# Patient Record
Sex: Female | Born: 1955 | Race: White | Smoking: Never smoker
Health system: Southern US, Community
[De-identification: ages and names within clinical notes are randomized; demographics above are authoritative.]

## PROBLEM LIST (undated history)

## (undated) ENCOUNTER — Emergency Department (HOSPITAL_COMMUNITY): Discharge status: Absent without leave | Payer: Commercial Managed Care - HMO

## (undated) DIAGNOSIS — F32A Depression, unspecified: Secondary | ICD-10-CM

## (undated) DIAGNOSIS — E785 Hyperlipidemia, unspecified: Secondary | ICD-10-CM

## (undated) DIAGNOSIS — F419 Anxiety disorder, unspecified: Secondary | ICD-10-CM

## (undated) DIAGNOSIS — E119 Type 2 diabetes mellitus without complications: Secondary | ICD-10-CM

## (undated) DIAGNOSIS — R519 Headache, unspecified: Secondary | ICD-10-CM

## (undated) DIAGNOSIS — G2581 Restless legs syndrome: Secondary | ICD-10-CM

## (undated) DIAGNOSIS — F329 Major depressive disorder, single episode, unspecified: Secondary | ICD-10-CM

## (undated) DIAGNOSIS — N189 Chronic kidney disease, unspecified: Secondary | ICD-10-CM

## (undated) DIAGNOSIS — D649 Anemia, unspecified: Secondary | ICD-10-CM

## (undated) DIAGNOSIS — J189 Pneumonia, unspecified organism: Secondary | ICD-10-CM

## (undated) DIAGNOSIS — C439 Malignant melanoma of skin, unspecified: Secondary | ICD-10-CM

## (undated) DIAGNOSIS — G4700 Insomnia, unspecified: Secondary | ICD-10-CM

## (undated) DIAGNOSIS — I10 Essential (primary) hypertension: Secondary | ICD-10-CM

## (undated) HISTORY — DX: Essential (primary) hypertension: I10

## (undated) HISTORY — DX: Depression, unspecified: F32.A

## (undated) HISTORY — DX: Type 2 diabetes mellitus without complications: E11.9

## (undated) HISTORY — DX: Insomnia, unspecified: G47.00

## (undated) HISTORY — PX: ABDOMINAL HYSTERECTOMY: SHX81

## (undated) HISTORY — DX: Restless legs syndrome: G25.81

## (undated) HISTORY — DX: Hyperlipidemia, unspecified: E78.5

## (undated) HISTORY — PX: UPPER GI ENDOSCOPY: SHX6162

---
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
## 1983-07-22 HISTORY — PX: MELANOMA EXCISION: SHX5266

## 2015-03-06 DIAGNOSIS — E119 Type 2 diabetes mellitus without complications: Secondary | ICD-10-CM | POA: Insufficient documentation

## 2015-03-06 DIAGNOSIS — E785 Hyperlipidemia, unspecified: Secondary | ICD-10-CM | POA: Insufficient documentation

## 2015-03-06 DIAGNOSIS — I10 Essential (primary) hypertension: Secondary | ICD-10-CM | POA: Insufficient documentation

## 2016-01-08 DIAGNOSIS — F419 Anxiety disorder, unspecified: Secondary | ICD-10-CM | POA: Insufficient documentation

## 2016-01-08 DIAGNOSIS — N1832 Chronic kidney disease, stage 3b: Secondary | ICD-10-CM | POA: Insufficient documentation

## 2016-01-08 DIAGNOSIS — N183 Chronic kidney disease, stage 3 unspecified: Secondary | ICD-10-CM | POA: Insufficient documentation

## 2018-09-29 ENCOUNTER — Ambulatory Visit: Payer: Self-pay | Attending: Nurse Practitioner | Admitting: Licensed Clinical Social Worker

## 2018-09-29 ENCOUNTER — Encounter: Payer: Self-pay | Admitting: Nurse Practitioner

## 2018-09-29 ENCOUNTER — Ambulatory Visit: Payer: Self-pay | Attending: Nurse Practitioner | Admitting: Nurse Practitioner

## 2018-09-29 ENCOUNTER — Other Ambulatory Visit: Payer: Self-pay

## 2018-09-29 VITALS — BP 139/83 | HR 82 | Temp 98.2°F | Ht 62.0 in | Wt 263.8 lb

## 2018-09-29 DIAGNOSIS — Z594 Lack of adequate food and safe drinking water: Secondary | ICD-10-CM

## 2018-09-29 DIAGNOSIS — G894 Chronic pain syndrome: Secondary | ICD-10-CM

## 2018-09-29 DIAGNOSIS — F332 Major depressive disorder, recurrent severe without psychotic features: Secondary | ICD-10-CM

## 2018-09-29 DIAGNOSIS — M1A09X0 Idiopathic chronic gout, multiple sites, without tophus (tophi): Secondary | ICD-10-CM

## 2018-09-29 DIAGNOSIS — N183 Chronic kidney disease, stage 3 unspecified: Secondary | ICD-10-CM

## 2018-09-29 DIAGNOSIS — Z5941 Food insecurity: Secondary | ICD-10-CM

## 2018-09-29 DIAGNOSIS — Z1211 Encounter for screening for malignant neoplasm of colon: Secondary | ICD-10-CM

## 2018-09-29 DIAGNOSIS — I10 Essential (primary) hypertension: Secondary | ICD-10-CM

## 2018-09-29 DIAGNOSIS — E1169 Type 2 diabetes mellitus with other specified complication: Secondary | ICD-10-CM

## 2018-09-29 LAB — POCT GLYCOSYLATED HEMOGLOBIN (HGB A1C): Hemoglobin A1C: 6.2 % — AB (ref 4.0–5.6)

## 2018-09-29 LAB — GLUCOSE, POCT (MANUAL RESULT ENTRY): POC GLUCOSE: 136 mg/dL — AB (ref 70–99)

## 2018-09-29 MED ORDER — FUROSEMIDE 40 MG PO TABS: 40.0000 mg | ORAL_TABLET | Freq: Every day | ORAL | 0 refills | Status: DC

## 2018-09-29 MED ORDER — ALLOPURINOL 100 MG PO TABS: 100.0000 mg | ORAL_TABLET | Freq: Every day | ORAL | 1 refills | Status: DC

## 2018-09-29 MED ORDER — LISINOPRIL 10 MG PO TABS: 10.0000 mg | ORAL_TABLET | Freq: Every day | ORAL | 2 refills | Status: DC

## 2018-09-29 MED ORDER — FUROSEMIDE 20 MG PO TABS: 20.0000 mg | ORAL_TABLET | Freq: Every day | ORAL | 0 refills | Status: DC

## 2018-09-29 MED ORDER — LOVASTATIN 40 MG PO TABS: 40.0000 mg | ORAL_TABLET | Freq: Every day | ORAL | 2 refills | Status: DC

## 2018-09-29 MED ORDER — GLIPIZIDE ER 10 MG PO TB24: 10.0000 mg | ORAL_TABLET | Freq: Every day | ORAL | 2 refills | Status: DC

## 2018-09-29 MED ORDER — ESCITALOPRAM OXALATE 20 MG PO TABS: 20.0000 mg | ORAL_TABLET | Freq: Every day | ORAL | 1 refills | Status: DC

## 2018-09-29 MED ORDER — PIOGLITAZONE HCL 30 MG PO TABS: 30.0000 mg | ORAL_TABLET | Freq: Every day | ORAL | 2 refills | Status: DC

## 2018-09-29 MED ORDER — DULOXETINE HCL 30 MG PO CPEP: 30.0000 mg | ORAL_CAPSULE | Freq: Two times a day (BID) | ORAL | 3 refills | Status: DC

## 2018-09-29 MED FILL — glipiZIDE XL 10 MG TB24: 10 | 30 days supply | Qty: 30 | Fill #0

## 2018-09-29 MED FILL — ESCITALOPRAM 20 MG TABLET: 20 | 30 days supply | Qty: 30 | Fill #0

## 2018-09-29 MED FILL — DULoxetine HCL 30 MG CPEP: 30 | 30 days supply | Qty: 60 | Fill #0

## 2018-09-29 MED FILL — PIOGLITAZONE HCL 30 MG TAB: 30 | 30 days supply | Qty: 30 | Fill #0

## 2018-09-29 MED FILL — ALLOPURINOL 100 MG TABLET: 100 | 30 days supply | Qty: 30 | Fill #0

## 2018-09-29 MED FILL — LOVASTATIN 40 MG TABS: 40 | 30 days supply | Qty: 30 | Fill #0

## 2018-09-29 MED FILL — FUROSEMIDE 20 MG TABLET: 20 | 30 days supply | Qty: 30 | Fill #0

## 2018-09-29 MED FILL — LISINOPRIL 10 MG TABS: 10 | 30 days supply | Qty: 30 | Fill #0

## 2018-09-29 NOTE — Progress Notes (Signed)
Assessment & Plan:  Kristy Flores was seen today for new patient (initial visit).  Diagnoses and all orders for this visit:  Type 2 diabetes mellitus with other specified complication, unspecified whether long term insulin use (HCC) -     Glucose (CBG) -     HgB A1c -     glipiZIDE (GLUCOTROL XL) 10 MG 24 hr tablet; Take 1 tablet (10 mg total) by mouth daily with breakfast. -     pioglitazone (ACTOS) 30 MG tablet; Take 1 tablet (30 mg total) by mouth daily. -     CBC -     CMP14+EGFR -     Lipid panel -     lovastatin (MEVACOR) 40 MG tablet; Take 1 tablet (40 mg total) by mouth at bedtime. -     TSH Continue blood sugar control as discussed in office today, low carbohydrate diet, and regular physical exercise as tolerated, 150 minutes per week (30 min each day, 5 days per week, or 50 min 3 days per week). Keep blood sugar logs with fasting goal of 90-130 mg/dl, post prandial (after you eat) less than 180.  For Hypoglycemia: BS <60 and Hyperglycemia BS >400; contact the clinic ASAP. Annual eye exams and foot exams are recommended.   Essential hypertension -     lisinopril (PRINIVIL,ZESTRIL) 10 MG tablet; Take 1 tablet (10 mg total) by mouth daily. Continue all antihypertensives as prescribed.  Remember to bring in your blood pressure log with you for your follow up appointment.  DASH/Mediterranean Diets are healthier choices for HTN.    Chronic pain syndrome -     DULoxetine (CYMBALTA) 30 MG capsule; Take 1 capsule (30 mg total) by mouth 2 (two) times daily for 30 days.   Chronic gout of multiple sites, unspecified cause -     allopurinol (ZYLOPRIM) 100 MG tablet; Take 1 tablet (100 mg total) by mouth daily for 30 days. -     Uric Acid  Colon cancer screening -     Fecal occult blood, imunochemical  Moderately severe recurrent major depression (HCC) -     escitalopram (LEXAPRO) 20 MG tablet; Take 1 tablet (20 mg total) by mouth daily for 30 days.  CKD (chronic kidney disease)  stage 3, GFR 30-59 ml/min (HCC) -     furosemide (LASIX) 20 MG tablet; Take 1 tablet (20 mg total) by mouth daily. Labs pending     Patient has been counseled on age-appropriate routine health concerns for screening and prevention. These are reviewed and up-to-date. Referrals have been placed accordingly. Immunizations are up-to-date or declined.    Subjective:   Chief Complaint  Patient presents with  . New Patient (Initial Visit)    Pt. is here as a new patient. Pt. stated her bone hurts.    HPI Kristy Flores 63 y.o. female presents to office today to establish care. She has a history of HTN, DM type 2, RLS, CPS, insomnia and CKD stage 3. Labs pending today.    DM TYPE 2 Chronic. Highest A1c that she can recall is  8.0. Currently well controlled at 6.2.. Medications taking include: Glipizide 70m daily and actos 30 mg daily. She does not monitor her blood glucose levels. Denis any hypo or hyperglycemic symptoms. Overdue for eye exam.  Lab Results  Component Value Date   HGBA1C 6.2 (A) 09/29/2018   Chronic Pain She was taking xanax 3 times a day and she was started on duloxetine instead 100 mg BID. Pain is generalized  and involves multiple joints.  She has tried gabapentin before but states it "made me swell up"    Essential Hypertension Chronic and well controlled taking lisinopril 32m daily. Denies chest pain, shortness of breath, palpitations, lightheadedness, dizziness, headaches or BLE edema. Discussed exercise and weight loss goals today. BMI 48 BP Readings from Last 3 Encounters:  09/29/18 139/83   Anxiety and Depression She has never taken an SSRI. Only xanax in the past. PHq9 screening is high as well as GAD score. She is willing to try lexapro today. Denies any thoughts of self harm. Will not continue xanax. She has not taken this medication since last year.   Depression screen PHQ 2/9 09/29/2018  Decreased Interest 0  Down, Depressed, Hopeless 2  PHQ - 2 Score 2   Altered sleeping 3  Tired, decreased energy 3  Change in appetite 3  Feeling bad or failure about yourself  3  Trouble concentrating 2  Moving slowly or fidgety/restless 0  Suicidal thoughts 0  PHQ-9 Score 16   GAD 7 : Generalized Anxiety Score 09/29/2018  Nervous, Anxious, on Edge 3  Control/stop worrying 3  Worry too much - different things 3  Trouble relaxing 3  Restless 3  Easily annoyed or irritable 3  Afraid - awful might happen 3  Total GAD 7 Score 21   Chronic GOUT In the past her gout symptoms have been well controlled with allopurinol 1066m. States she has taken as much as 200 mg however this was decreased to 100 mg by her previous PCP. She can not recall the reason.    Review of Systems  Constitutional: Negative for fever, malaise/fatigue and weight loss.  HENT: Negative.  Negative for nosebleeds.   Eyes: Negative.  Negative for blurred vision, double vision and photophobia.  Respiratory: Negative.  Negative for cough and shortness of breath.   Cardiovascular: Negative.  Negative for chest pain, palpitations and leg swelling.  Gastrointestinal: Negative.  Negative for heartburn, nausea and vomiting.  Musculoskeletal: Positive for joint pain. Negative for myalgias.       SEE HPI  Neurological: Negative.  Negative for dizziness, focal weakness, seizures and headaches.  Psychiatric/Behavioral: Positive for depression. Negative for suicidal ideas. The patient is nervous/anxious and has insomnia.     Past Medical History:  Diagnosis Date  . [place]  . Hyperlipidemia   . Hypertension   . Insomnia   . Restless leg syndrome     Past Surgical History:  Procedure Laterality Date  . ABDOMINAL HYSTERECTOMY    . MELANOMA EXCISION  1985    Family History  Problem Relation Age of Onset  . Diabetes Mother   . Cancer Father     Social History Reviewed with no changes to be made today.   Outpatient Medications Prior to Visit   Medication Sig Dispense Refill  . allopurinol (ZYLOPRIM) 100 MG tablet Take 100 mg by mouth daily.    . DULOXETINE HCL PO Take 30 mg by mouth 2 (two) times daily.     . furosemide (LASIX) 40 MG tablet Take 40 mg by mouth daily. 1/2 tablet    . glipiZIDE (GLUCOTROL XL) 10 MG 24 hr tablet Take 10 mg by mouth daily with breakfast.    . lovastatin (MEVACOR) 40 MG tablet Take 40 mg by mouth at bedtime.    . pioglitazone (ACTOS) 30 MG tablet Take 30 mg by mouth daily.    . Marland KitchenLPRAZolam (XANAX) 0.5 MG tablet Take  0.5 mg by mouth at bedtime as needed for anxiety.    Marland Kitchen lisinopril (PRINIVIL,ZESTRIL) 10 MG tablet Take 10 mg by mouth daily.     No facility-administered medications prior to visit.     Allergies  Allergen Reactions  . Codeine     Vomit  . Gabapentin     AKI  . Penicillins     Broke out, stomach upset.   . Victoza [Liraglutide]     creatinine       Objective:    BP 139/83 (BP Location: Left Arm, Patient Position: Sitting, Cuff Size: Large)   Pulse 82   Temp 98.2 F (36.8 C) (Oral)   Ht _0  (1.575 m)   Wt 263 lb 12.8 oz (119.7 kg)   SpO2 96%   BMI 48.25 kg/m  Wt Readings from Last 3 Encounters:  09/29/18 263 lb 12.8 oz (119.7 kg)    Physical Exam Vitals signs and nursing note reviewed.  Constitutional:      Appearance: She is well-developed.  HENT:     Head: Normocephalic and atraumatic.  Neck:     Musculoskeletal: Normal range of motion.  Cardiovascular:     Rate and Rhythm: Normal rate and regular rhythm.     Heart sounds: Normal heart sounds. No murmur. No friction rub. No gallop.   Pulmonary:     Effort: Pulmonary effort is normal. No tachypnea or respiratory distress.     Breath sounds: Normal breath sounds. No decreased breath sounds, wheezing, rhonchi or rales.  Chest:     Chest wall: No tenderness.  Abdominal:     General: Bowel sounds are normal.     Palpations: Abdomen is soft.  Musculoskeletal: Normal range of motion.  Skin:    General: Skin  is warm and dry.  Neurological:     Mental Status: She is alert and oriented to person, place, and time.     Coordination: Coordination normal.  Psychiatric:        Behavior: Behavior normal. Behavior is cooperative.        Thought Content: Thought content normal.        Judgment: Judgment normal.          Patient has been counseled extensively about nutrition and exercise as well as the importance of adherence with medications and regular follow-up. The patient was given clear instructions to go to ER or return to medical center if symptoms don't improve, worsen or new problems develop. The patient verbalized understanding.   Follow-up: Return in about 2 months (around 11/29/2018) for Physical ONLY no labs.   Gildardo Pounds, FNP-BC [facility] and [place], [place]   09/29/2018, 9:33 PM

## 2018-09-30 ENCOUNTER — Other Ambulatory Visit: Payer: Self-pay | Admitting: Nurse Practitioner

## 2018-09-30 DIAGNOSIS — M1A09X0 Idiopathic chronic gout, multiple sites, without tophus (tophi): Secondary | ICD-10-CM

## 2018-09-30 LAB — CMP14+EGFR
ALT: 15 IU/L (ref 0–32)
AST: 15 IU/L (ref 0–40)
Albumin/Globulin Ratio: 1.4 (ref 1.2–2.2)
Albumin: 4.2 g/dL (ref 3.8–4.8)
Alkaline Phosphatase: 85 IU/L (ref 39–117)
BILIRUBIN TOTAL: 0.2 mg/dL (ref 0.0–1.2)
BUN/Creatinine Ratio: 30 — ABNORMAL HIGH (ref 12–28)
BUN: 45 mg/dL — AB (ref 8–27)
CHLORIDE: 103 mmol/L (ref 96–106)
CO2: 22 mmol/L (ref 20–29)
Calcium: 9.7 mg/dL (ref 8.7–10.3)
Creatinine, Ser: 1.5 mg/dL — ABNORMAL HIGH (ref 0.57–1.00)
GFR calc Af Amer: 43 mL/min/{1.73_m2} — ABNORMAL LOW (ref >59)
GFR calc non Af Amer: 37 mL/min/{1.73_m2} — ABNORMAL LOW (ref >59)
GLUCOSE: 120 mg/dL — AB (ref 65–99)
Globulin, Total: 2.9 g/dL (ref 1.5–4.5)
Potassium: 4.2 mmol/L (ref 3.5–5.2)
Sodium: 143 mmol/L (ref 134–144)
Total Protein: 7.1 g/dL (ref 6.0–8.5)

## 2018-09-30 LAB — LIPID PANEL
Chol/HDL Ratio: 4.8 ratio — ABNORMAL HIGH (ref 0.0–4.4)
Cholesterol, Total: 216 mg/dL — ABNORMAL HIGH (ref 100–199)
HDL: 45 mg/dL (ref >39)
LDL Calculated: 130 mg/dL — ABNORMAL HIGH (ref 0–99)
Triglycerides: 206 mg/dL — ABNORMAL HIGH (ref 0–149)
VLDL Cholesterol Cal: 41 mg/dL — ABNORMAL HIGH (ref 5–40)

## 2018-09-30 LAB — CBC
Hematocrit: 37.7 % (ref 34.0–46.6)
Hemoglobin: 12.2 g/dL (ref 11.1–15.9)
MCH: 29.8 pg (ref 26.6–33.0)
MCHC: 32.4 g/dL (ref 31.5–35.7)
MCV: 92 fL (ref 79–97)
Platelets: 253 10*3/uL (ref 150–450)
RBC: 4.1 x10E6/uL (ref 3.77–5.28)
RDW: 13.6 % (ref 11.7–15.4)
WBC: 7.4 10*3/uL (ref 3.4–10.8)

## 2018-09-30 LAB — URIC ACID: Uric Acid: 7.4 mg/dL — ABNORMAL HIGH (ref 2.5–7.1)

## 2018-09-30 LAB — TSH: TSH: 2.45 u[IU]/mL (ref 0.450–4.500)

## 2018-09-30 MED ORDER — ALLOPURINOL 100 MG PO TABS: 200.0000 mg | ORAL_TABLET | Freq: Every day | ORAL | 2 refills | Status: DC

## 2018-10-01 ENCOUNTER — Telehealth: Payer: Self-pay

## 2018-10-01 NOTE — Telephone Encounter (Signed)
-----   Message from Gildardo Pounds, NP sent at 09/30/2018 11:10 PM EDT ----- Labs do not show anemia. Creatinine level is elevated. Liver function is normal. Cholesterol levels are elevated as well. INSTRUCTIONS: Work on a low fat, heart healthy diet and participate in regular aerobic exercise program by working out at least 150 minutes per week; 5 days a week-30 minutes per day. Avoid red meat, fried foods. junk foods, sodas, sugary drinks, unhealthy snacking, alcohol and smoking.  Drink at least 48oz of water per day and monitor your carbohydrate intake daily. Thyroid level is normal. Uric acid is high. Will increase allopurinol to 200mg . Please take 2 tablets daily. When you run out the new prescription will be sent to the pharmacy. Please make an appointment to recheck your uric acid levels in 6weeks.

## 2018-10-01 NOTE — Telephone Encounter (Signed)
CMA spoke to patient to inform on results.  Pt. Is aware of new medication changes.  Pt. Verified DOB.  Pt. Scheduled a lab appt to recheck Uric Acid Level on 11/12/2018.

## 2018-10-01 NOTE — BH Specialist Note (Signed)
LCSWA introduced self and explained role at [facility]. Pt went over SDOH screener which indicated food insecurity. Supportive resources were provided.

## 2018-10-04 ENCOUNTER — Other Ambulatory Visit: Payer: Self-pay | Admitting: Nurse Practitioner

## 2018-10-04 DIAGNOSIS — E790 Hyperuricemia without signs of inflammatory arthritis and tophaceous disease: Secondary | ICD-10-CM

## 2018-10-12 ENCOUNTER — Telehealth (HOSPITAL_COMMUNITY): Payer: Self-pay

## 2018-10-12 NOTE — Telephone Encounter (Signed)
Left message with patient letting her know we received a referral from her primary care provider to get her scheduled for a mammo. Left name and number for her to call back.

## 2018-10-28 MED FILL — LISINOPRIL 10 MG TABS: 10 | 30 days supply | Qty: 30 | Fill #1

## 2018-10-28 MED FILL — FUROSEMIDE 20 MG TABS: 20 | 30 days supply | Qty: 30 | Fill #1

## 2018-10-28 MED FILL — PIOGLITAZONE HCL 30 MG TAB: 30 | 30 days supply | Qty: 30 | Fill #1

## 2018-10-28 MED FILL — glipiZIDE XL 10 MG TB24: 10 | 30 days supply | Qty: 30 | Fill #1

## 2018-10-28 MED FILL — ALLOPURINOL 100 MG TABLET: 100 | 30 days supply | Qty: 30 | Fill #1

## 2018-10-28 MED FILL — ESCITALOPRAM 20 MG TABLET: 20 | 30 days supply | Qty: 30 | Fill #1

## 2018-10-28 MED FILL — DULoxetine HCL 30 MG CPEP: 30 | 30 days supply | Qty: 60 | Fill #1

## 2018-10-28 MED FILL — LOVASTATIN 40 MG TABS: 40 | 30 days supply | Qty: 30 | Fill #1

## 2018-11-09 ENCOUNTER — Telehealth: Payer: Self-pay | Admitting: Licensed Clinical Social Worker

## 2018-11-09 NOTE — Telephone Encounter (Signed)
Call placed to patient to inform of food delivery services starting Wednesday, November 10, 2018. LCSWA requested patient return phone call at earliest convenience.

## 2018-11-12 ENCOUNTER — Ambulatory Visit: Payer: Self-pay | Attending: Family Medicine

## 2018-11-12 ENCOUNTER — Other Ambulatory Visit: Payer: Self-pay

## 2018-11-12 DIAGNOSIS — E790 Hyperuricemia without signs of inflammatory arthritis and tophaceous disease: Secondary | ICD-10-CM

## 2018-11-13 LAB — URIC ACID: Uric Acid: 4.3 mg/dL (ref 2.5–7.1)

## 2018-11-16 ENCOUNTER — Telehealth: Payer: Self-pay

## 2018-11-16 NOTE — Telephone Encounter (Signed)
-----   Message from Gildardo Pounds, NP sent at 11/14/2018  1:18 PM EDT ----- Uric acid is normal. Continue allopurinol 200 mg daily as prescribed.

## 2018-11-16 NOTE — Telephone Encounter (Signed)
CMA attempt to reach patient to inform on results.   CMA left a VM for patient.

## 2018-12-01 ENCOUNTER — Other Ambulatory Visit: Payer: Self-pay | Admitting: Nurse Practitioner

## 2018-12-01 DIAGNOSIS — F332 Major depressive disorder, recurrent severe without psychotic features: Secondary | ICD-10-CM

## 2018-12-01 MED FILL — LOVASTATIN 40 MG TABS: 40 | 30 days supply | Qty: 30 | Fill #2

## 2018-12-01 MED FILL — FUROSEMIDE 20 MG TABS: 20 | 30 days supply | Qty: 30 | Fill #2

## 2018-12-01 MED FILL — LISINOPRIL 10 MG TABS: 10 | 30 days supply | Qty: 30 | Fill #2

## 2018-12-01 MED FILL — ESCITALOPRAM 20 MG TABLET: 20 | 30 days supply | Qty: 30 | Fill #0

## 2018-12-01 MED FILL — PIOGLITAZONE HCL 30 MG TAB: 30 | 30 days supply | Qty: 30 | Fill #2

## 2018-12-01 MED FILL — glipiZIDE XL 10 MG TB24: 10 | 30 days supply | Qty: 30 | Fill #2

## 2018-12-01 MED FILL — ALLOPURINOL 100 MG TABLET: 100 | 30 days supply | Qty: 60 | Fill #0

## 2018-12-01 MED FILL — DULoxetine HCL 30 MG CPEP: 30 | 30 days supply | Qty: 60 | Fill #2

## 2018-12-02 ENCOUNTER — Telehealth (HOSPITAL_COMMUNITY): Payer: Self-pay

## 2018-12-02 NOTE — Telephone Encounter (Signed)
Left message with patient letting her know that we received a referral from her primary care provider to get her scheduled with our [facility] program. Left name and number for her to call back.

## 2018-12-03 ENCOUNTER — Ambulatory Visit: Payer: Self-pay | Admitting: Nurse Practitioner

## 2018-12-31 ENCOUNTER — Other Ambulatory Visit: Payer: Self-pay | Admitting: Nurse Practitioner

## 2018-12-31 DIAGNOSIS — N183 Chronic kidney disease, stage 3 unspecified: Secondary | ICD-10-CM

## 2018-12-31 MED FILL — LOVASTATIN 40 MG TABS: 40 | 30 days supply | Qty: 30 | Fill #3

## 2018-12-31 MED FILL — ALLOPURINOL 100 MG TABLET: 100 | 15 days supply | Qty: 30 | Fill #1

## 2018-12-31 MED FILL — DULoxetine HCL 30 MG CPEP: 30 | 30 days supply | Qty: 60 | Fill #3

## 2018-12-31 MED FILL — PIOGLITAZONE HCL 30 MG TAB: 30 | 30 days supply | Qty: 30 | Fill #3

## 2018-12-31 MED FILL — glipiZIDE XL 10 MG TB24: 10 | 30 days supply | Qty: 30 | Fill #3

## 2018-12-31 MED FILL — LISINOPRIL 10 MG TABS: 10 | 30 days supply | Qty: 30 | Fill #3

## 2019-01-03 MED FILL — FUROSEMIDE 20 MG TABS: 20 | 30 days supply | Qty: 30 | Fill #0

## 2019-01-20 ENCOUNTER — Other Ambulatory Visit: Payer: Self-pay | Admitting: Nurse Practitioner

## 2019-01-20 DIAGNOSIS — M1A09X0 Idiopathic chronic gout, multiple sites, without tophus (tophi): Secondary | ICD-10-CM

## 2019-01-20 MED FILL — ALLOPURINOL 100 MG TABLET: 100 | 30 days supply | Qty: 60 | Fill #0

## 2019-02-02 ENCOUNTER — Other Ambulatory Visit: Payer: Self-pay | Admitting: Nurse Practitioner

## 2019-02-02 DIAGNOSIS — G894 Chronic pain syndrome: Secondary | ICD-10-CM

## 2019-02-02 MED FILL — glipiZIDE XL 10 MG TB24: 10 | 30 days supply | Qty: 30 | Fill #4

## 2019-02-02 MED FILL — DULoxetine HCL 30 MG CPEP: 30 | 30 days supply | Qty: 60 | Fill #0

## 2019-02-02 MED FILL — FUROSEMIDE 20 MG TABS: 20 | 30 days supply | Qty: 30 | Fill #1

## 2019-02-02 MED FILL — LISINOPRIL 10 MG TABS: 10 | 30 days supply | Qty: 30 | Fill #4

## 2019-02-02 MED FILL — PIOGLITAZONE HCL 30 MG TAB: 30 | 30 days supply | Qty: 30 | Fill #4

## 2019-02-02 MED FILL — LOVASTATIN 40 MG TABS: 40 | 30 days supply | Qty: 30 | Fill #4

## 2019-02-18 MED FILL — ALLOPURINOL 100 MG TABLET: 100 | 30 days supply | Qty: 60 | Fill #1

## 2019-03-07 ENCOUNTER — Other Ambulatory Visit: Payer: Self-pay | Admitting: Nurse Practitioner

## 2019-03-07 DIAGNOSIS — G894 Chronic pain syndrome: Secondary | ICD-10-CM

## 2019-03-07 MED FILL — PIOGLITAZONE HCL 30 MG TAB: 30 | 30 days supply | Qty: 30 | Fill #5

## 2019-03-07 MED FILL — LISINOPRIL 10 MG TABS: 10 | 30 days supply | Qty: 30 | Fill #5

## 2019-03-07 MED FILL — glipiZIDE XL 10 MG TB24: 10 | 30 days supply | Qty: 30 | Fill #5

## 2019-03-07 MED FILL — LOVASTATIN 40 MG TABS: 40 | 30 days supply | Qty: 30 | Fill #5

## 2019-03-07 MED FILL — FUROSEMIDE 20 MG TABS: 20 | 30 days supply | Qty: 30 | Fill #2

## 2019-03-08 MED FILL — DULoxetine HCL 30 MG CPEP: 30 | 30 days supply | Qty: 60 | Fill #0

## 2019-03-22 ENCOUNTER — Other Ambulatory Visit: Payer: Self-pay | Admitting: Nurse Practitioner

## 2019-03-22 DIAGNOSIS — M1A09X0 Idiopathic chronic gout, multiple sites, without tophus (tophi): Secondary | ICD-10-CM

## 2019-03-24 ENCOUNTER — Other Ambulatory Visit: Payer: Self-pay | Admitting: Nurse Practitioner

## 2019-03-24 DIAGNOSIS — M1A09X0 Idiopathic chronic gout, multiple sites, without tophus (tophi): Secondary | ICD-10-CM

## 2019-04-12 ENCOUNTER — Encounter: Payer: Self-pay | Admitting: Nurse Practitioner

## 2019-04-12 ENCOUNTER — Other Ambulatory Visit: Payer: Self-pay

## 2019-04-12 ENCOUNTER — Ambulatory Visit: Payer: Self-pay | Attending: Nurse Practitioner | Admitting: Nurse Practitioner

## 2019-04-12 ENCOUNTER — Ambulatory Visit (HOSPITAL_BASED_OUTPATIENT_CLINIC_OR_DEPARTMENT_OTHER): Payer: Self-pay | Admitting: Pharmacist

## 2019-04-12 VITALS — BP 96/61 | HR 89 | Temp 98.5°F | Ht 62.0 in | Wt 262.0 lb

## 2019-04-12 DIAGNOSIS — I10 Essential (primary) hypertension: Secondary | ICD-10-CM

## 2019-04-12 DIAGNOSIS — E1169 Type 2 diabetes mellitus with other specified complication: Secondary | ICD-10-CM

## 2019-04-12 DIAGNOSIS — N183 Chronic kidney disease, stage 3 unspecified: Secondary | ICD-10-CM

## 2019-04-12 DIAGNOSIS — G894 Chronic pain syndrome: Secondary | ICD-10-CM

## 2019-04-12 DIAGNOSIS — M1A09X0 Idiopathic chronic gout, multiple sites, without tophus (tophi): Secondary | ICD-10-CM

## 2019-04-12 DIAGNOSIS — F331 Major depressive disorder, recurrent, moderate: Secondary | ICD-10-CM

## 2019-04-12 DIAGNOSIS — Z23 Encounter for immunization: Secondary | ICD-10-CM

## 2019-04-12 LAB — POCT GLYCOSYLATED HEMOGLOBIN (HGB A1C): Hemoglobin A1C: 6.4 % — AB (ref 4.0–5.6)

## 2019-04-12 LAB — GLUCOSE, POCT (MANUAL RESULT ENTRY): POC Glucose: 106 mg/dl — AB (ref 70–99)

## 2019-04-12 MED ORDER — ALLOPURINOL 100 MG PO TABS: 200.0000 mg | ORAL_TABLET | Freq: Every day | ORAL | 1 refills | Status: DC

## 2019-04-12 MED ORDER — LOVASTATIN 40 MG PO TABS: 40.0000 mg | ORAL_TABLET | Freq: Every day | ORAL | 2 refills | Status: DC

## 2019-04-12 MED ORDER — GLIPIZIDE ER 10 MG PO TB24: 10.0000 mg | ORAL_TABLET | Freq: Every day | ORAL | 2 refills | Status: DC

## 2019-04-12 MED ORDER — LISINOPRIL 10 MG PO TABS: 10.0000 mg | ORAL_TABLET | Freq: Every day | ORAL | 2 refills | Status: DC

## 2019-04-12 MED ORDER — DULOXETINE HCL 30 MG PO CPEP: 30.0000 mg | ORAL_CAPSULE | Freq: Two times a day (BID) | ORAL | 0 refills | Status: DC

## 2019-04-12 MED ORDER — PIOGLITAZONE HCL 30 MG PO TABS: 30.0000 mg | ORAL_TABLET | Freq: Every day | ORAL | 2 refills | Status: DC

## 2019-04-12 MED ORDER — LISINOPRIL 5 MG PO TABS: 5.0000 mg | ORAL_TABLET | Freq: Every day | ORAL | 2 refills | Status: DC

## 2019-04-12 MED ORDER — PAROXETINE HCL 20 MG PO TABS: 20.0000 mg | ORAL_TABLET | Freq: Every day | ORAL | 1 refills | Status: DC

## 2019-04-12 MED ORDER — FUROSEMIDE 20 MG PO TABS: 20.0000 mg | ORAL_TABLET | Freq: Every day | ORAL | 0 refills | Status: DC

## 2019-04-12 MED FILL — ALLOPURINOL 100 MG TABLET: 100 | 30 days supply | Qty: 60 | Fill #0

## 2019-04-12 MED FILL — PIOGLITAZONE HCL 30 MG TAB: 30 | 30 days supply | Qty: 30 | Fill #0

## 2019-04-12 MED FILL — DULoxetine HCL 30 MG CPEP: 30 | 30 days supply | Qty: 60 | Fill #0

## 2019-04-12 MED FILL — FUROSEMIDE 20 MG TABS: 20 | 30 days supply | Qty: 30 | Fill #0

## 2019-04-12 MED FILL — LOVASTATIN 40 MG TABS: 40 | 30 days supply | Qty: 30 | Fill #0

## 2019-04-12 MED FILL — PARoxetine HCL 20 MG TABS: 20 | 30 days supply | Qty: 30 | Fill #0

## 2019-04-12 MED FILL — glipiZIDE XL 10 MG TB24: 10 | 30 days supply | Qty: 30 | Fill #0

## 2019-04-12 NOTE — Progress Notes (Signed)
Patient presents for vaccination against influenza per orders of Dr. Johnson. Consent given. Counseling provided. No contraindications exists. Vaccine administered without incident.   

## 2019-04-12 NOTE — Progress Notes (Signed)
Assessment & Plan:  Kristy Flores was seen today for follow-up.  Diagnoses and all orders for this visit:  Type 2 diabetes mellitus with other specified complication, unspecified whether long term insulin use (HCC) -     Glucose (CBG) -     HgB A1c -     CMP14+EGFR -     Fecal occult blood, imunochemical -     Lipid panel -     Uric Acid -     Microalbumin / creatinine urine ratio -     glipiZIDE (GLUCOTROL XL) 10 MG 24 hr tablet; Take 1 tablet (10 mg total) by mouth daily with breakfast. -     lovastatin (MEVACOR) 40 MG tablet; Take 1 tablet (40 mg total) by mouth at bedtime. -     pioglitazone (ACTOS) 30 MG tablet; Take 1 tablet (30 mg total) by mouth daily. Continue blood sugar control as discussed in office today, low carbohydrate diet, and regular physical exercise as tolerated, 150 minutes per week (30 min each day, 5 days per week, or 50 min 3 days per week). Keep blood sugar logs with fasting goal of 90-130 mg/dl, post prandial (after you eat) less than 180.  For Hypoglycemia: BS <60 and Hyperglycemia BS >400; contact the clinic ASAP. Annual eye exams and foot exams are recommended.   Chronic gout of multiple sites, unspecified cause -     allopurinol (ZYLOPRIM) 100 MG tablet; Take 2 tablets (200 mg total) by mouth daily.  Chronic pain syndrome -     DULoxetine (CYMBALTA) 30 MG capsule; Take 1 capsule (30 mg total) by mouth 2 (two) times daily. MUST MAKE APPT FOR FURTHER REFILLS  CKD (chronic kidney disease) stage 3, GFR 30-59 ml/min (HCC) -     furosemide (LASIX) 20 MG tablet; Take 1 tablet (20 mg total) by mouth daily. Last Cr+1.50 (09-29-2018)  Essential hypertension -     lisinopril (ZESTRIL) 10 MG tablet; Take 1 tablet (10 mg total) by mouth daily. Continue all antihypertensives as prescribed.  Remember to bring in your blood pressure log with you for your follow up appointment.  DASH/Mediterranean Diets are healthier choices for HTN.   Moderate episode of recurrent major  depressive disorder (HCC) -     PARoxetine (PAXIL) 20 MG tablet; Take 1 tablet (20 mg total) by mouth daily  Patient has been counseled on age-appropriate routine health concerns for screening and prevention. These are reviewed and up-to-date. Referrals have been placed accordingly. Immunizations are up-to-date or declined.    Subjective:   Chief Complaint  Patient presents with  . Follow-up    Pt. is here for a follow up and medication refills.    HPI Kristy Flores 63 y.o. female presents to office today for follow up.   has a past medical history of Depression, Diabetes mellitus without complication ([place]), Hyperlipidemia, Hypertension, Insomnia, and Restless leg syndrome.    DM TYPE 2 Well controlled despite her lack of dietary and exercise compliance.  She is currently taking Actos 30 mg daily and glipizide 10 mg daily.  She does not monitor her blood glucose levels at home and denies any hypo-or hyperglycemic symptoms.  She is overdue for an eye exam. Referral placed today.  Lab Results  Component Value Date   HGBA1C 6.4 (A) 04/12/2019   Anxiety and Depression She was prescribed Lexapro 20 mg earlier this year (09-2018) however she states the lexapro seemed to cause hyperactivity. She is currently taking cymbalta 30 mg BID for chronic  pain but states this does not help her mood lability. She denies any current thoughts of self harm.  Depression screen [facility] 2/9 04/12/2019 09/29/2018  Decreased Interest 3 0  Down, Depressed, Hopeless 3 2  PHQ - 2 Score 6 2  Altered sleeping 3 3  Tired, decreased energy 3 3  Change in appetite 2 3  Feeling bad or failure about yourself  2 3  Trouble concentrating 3 2  Moving slowly or fidgety/restless 3 0  Suicidal thoughts 2 0  PHQ-9 Score 24 16   GAD 7 : Generalized Anxiety Score 04/12/2019 09/29/2018  Nervous, Anxious, on Edge 3 3  Control/stop worrying 3 3  Worry too much - different things 3 3  Trouble relaxing 3 3  Restless 2 3  Easily  annoyed or irritable 2 3  Afraid - awful might happen 2 3  Total GAD 7 Score 18 21   Essential Hypertension She endorses medication compliance taking lisinopril 10 mg daily. Denies chest pain, shortness of breath, palpitations, lightheadedness, dizziness, headaches or BLE edema. She does not monitor her blood pressure at home. Will decrease lisinopril to 5 mg and have her return to BP recheck in a few weeks.  BP Readings from Last 3 Encounters:  04/12/19 96/61  09/29/18 139/83    Review of Systems  Constitutional: Negative for fever, malaise/fatigue and weight loss.  HENT: Negative.  Negative for nosebleeds.   Eyes: Negative.  Negative for blurred vision, double vision and photophobia.  Respiratory: Negative.  Negative for cough and shortness of breath.   Cardiovascular: Negative.  Negative for chest pain, palpitations and leg swelling.  Gastrointestinal: Negative.  Negative for heartburn, nausea and vomiting.  Musculoskeletal: Positive for joint pain and myalgias.       CPS  Neurological: Negative.  Negative for dizziness, focal weakness, seizures and headaches.  Psychiatric/Behavioral: Positive for depression. Negative for suicidal ideas. The patient is nervous/anxious and has insomnia.     Past Medical History:  Diagnosis Date  . Depression   . Diabetes mellitus without complication ([place])   . Hyperlipidemia   . Hypertension   . Insomnia   . Restless leg syndrome     Past Surgical History:  Procedure Laterality Date  . ABDOMINAL HYSTERECTOMY    . MELANOMA EXCISION  1985    Family History  Problem Relation Age of Onset  . Diabetes Mother   . Cancer Father     Social History Reviewed with no changes to be made today.   Outpatient Medications Prior to Visit  Medication Sig Dispense Refill  . DULoxetine (CYMBALTA) 30 MG capsule TAKE 1 CAPSULE (30 MG TOTAL) BY MOUTH 2 (TWO) TIMES DAILY. MUST MAKE APPT FOR FURTHER REFILLS 60 capsule 0  . ALPRAZolam (XANAX) 0.5 MG tablet  Take 0.5 mg by mouth at bedtime as needed for anxiety.    Marland Kitchen allopurinol (ZYLOPRIM) 100 MG tablet TAKE 2 TABLETS (200 MG TOTAL) BY MOUTH DAILY FOR 30 DAYS. 60 tablet 1  . escitalopram (LEXAPRO) 20 MG tablet Take 1 tablet (20 mg total) by mouth daily. 30 tablet 2  . furosemide (LASIX) 20 MG tablet TAKE 1 TABLET (20 MG TOTAL) BY MOUTH DAILY. 90 tablet 0  . glipiZIDE (GLUCOTROL XL) 10 MG 24 hr tablet Take 1 tablet (10 mg total) by mouth daily with breakfast. 90 tablet 2  . lisinopril (PRINIVIL,ZESTRIL) 10 MG tablet Take 1 tablet (10 mg total) by mouth daily. 90 tablet 2  . lovastatin (MEVACOR) 40 MG tablet Take  1 tablet (40 mg total) by mouth at bedtime. 90 tablet 2  . pioglitazone (ACTOS) 30 MG tablet Take 1 tablet (30 mg total) by mouth daily. 90 tablet 2   No facility-administered medications prior to visit.     Allergies  Allergen Reactions  . Codeine     Vomit  . Gabapentin     AKI  . Penicillins     Broke out, stomach upset.   Donna Bernard [Liraglutide]     creatinine       Objective:    BP 96/61 (BP Location: Left Arm, Patient Position: Sitting, Cuff Size: Large)   Pulse 89   Temp 98.5 F (36.9 C) (Oral)   Ht 5' 2"  (1.575 m)   Wt 262 lb (118.8 kg)   SpO2 98%   BMI 47.92 kg/m  Wt Readings from Last 3 Encounters:  04/12/19 262 lb (118.8 kg)  09/29/18 263 lb 12.8 oz (119.7 kg)    Physical Exam Vitals signs and nursing note reviewed.  Constitutional:      Appearance: She is well-developed.  HENT:     Head: Normocephalic and atraumatic.  Neck:     Musculoskeletal: Normal range of motion.  Cardiovascular:     Rate and Rhythm: Normal rate and regular rhythm.     Heart sounds: Normal heart sounds. No murmur. No friction rub. No gallop.   Pulmonary:     Effort: Pulmonary effort is normal. No tachypnea or respiratory distress.     Breath sounds: Normal breath sounds. No decreased breath sounds, wheezing, rhonchi or rales.  Chest:     Chest wall: No tenderness.   Abdominal:     General: Bowel sounds are normal.     Palpations: Abdomen is soft.  Musculoskeletal:     Right lower leg: No edema.     Left lower leg: No edema.  Skin:    General: Skin is warm and dry.  Neurological:     Mental Status: She is alert and oriented to person, place, and time.     Coordination: Coordination normal.  Psychiatric:        Behavior: Behavior normal. Behavior is cooperative.        Thought Content: Thought content normal.        Judgment: Judgment normal.          Patient has been counseled extensively about nutrition and exercise as well as the importance of adherence with medications and regular follow-up. The patient was given clear instructions to go to ER or return to medical center if symptoms don't improve, worsen or new problems develop. The patient verbalized understanding.   Follow-up: Return in about 3 weeks (around 05/03/2019) for depression .   Gildardo Pounds, FNP-BC [facility] and [place], [place]   04/12/2019, 7:33 PM

## 2019-04-13 LAB — CMP14+EGFR
ALT: 16 IU/L (ref 0–32)
AST: 13 IU/L (ref 0–40)
Albumin/Globulin Ratio: 1.6 (ref 1.2–2.2)
Albumin: 4.3 g/dL (ref 3.8–4.8)
Alkaline Phosphatase: 103 IU/L (ref 39–117)
BUN/Creatinine Ratio: 39 — ABNORMAL HIGH (ref 12–28)
BUN: 80 mg/dL (ref 8–27)
Bilirubin Total: 0.2 mg/dL (ref 0.0–1.2)
CO2: 20 mmol/L (ref 20–29)
Calcium: 9.7 mg/dL (ref 8.7–10.3)
Chloride: 105 mmol/L (ref 96–106)
Creatinine, Ser: 2.05 mg/dL — ABNORMAL HIGH (ref 0.57–1.00)
GFR calc Af Amer: 30 mL/min/{1.73_m2} — ABNORMAL LOW (ref >59)
GFR calc non Af Amer: 26 mL/min/{1.73_m2} — ABNORMAL LOW (ref >59)
Globulin, Total: 2.7 g/dL (ref 1.5–4.5)
Glucose: 103 mg/dL — ABNORMAL HIGH (ref 65–99)
Potassium: 5.2 mmol/L (ref 3.5–5.2)
Sodium: 139 mmol/L (ref 134–144)
Total Protein: 7 g/dL (ref 6.0–8.5)

## 2019-04-13 LAB — LIPID PANEL
Chol/HDL Ratio: 6.5 ratio — ABNORMAL HIGH (ref 0.0–4.4)
Cholesterol, Total: 227 mg/dL — ABNORMAL HIGH (ref 100–199)
HDL: 35 mg/dL — ABNORMAL LOW (ref >39)
LDL Chol Calc (NIH): 131 mg/dL — ABNORMAL HIGH (ref 0–99)
Triglycerides: 337 mg/dL — ABNORMAL HIGH (ref 0–149)
VLDL Cholesterol Cal: 61 mg/dL — ABNORMAL HIGH (ref 5–40)

## 2019-04-13 LAB — URIC ACID: Uric Acid: 9.3 mg/dL — ABNORMAL HIGH (ref 2.5–7.1)

## 2019-04-13 LAB — MICROALBUMIN / CREATININE URINE RATIO
Creatinine, Urine: 188 mg/dL
Microalb/Creat Ratio: 7 mg/g creat (ref 0–29)
Microalbumin, Urine: 14 ug/mL

## 2019-04-13 MED FILL — LISINOPRIL 5 MG TABLET: 5 | 30 days supply | Qty: 30 | Fill #0

## 2019-04-14 ENCOUNTER — Telehealth: Payer: Self-pay | Admitting: Nurse Practitioner

## 2019-04-14 NOTE — Telephone Encounter (Signed)
Pt would like another call back with her lab results, please follow up

## 2019-04-14 NOTE — Telephone Encounter (Signed)
Spoke to patient and inform on results.

## 2019-04-18 ENCOUNTER — Other Ambulatory Visit: Payer: Self-pay | Admitting: Nurse Practitioner

## 2019-04-18 DIAGNOSIS — R7989 Other specified abnormal findings of blood chemistry: Secondary | ICD-10-CM

## 2019-04-22 ENCOUNTER — Ambulatory Visit: Payer: Self-pay | Admitting: Pharmacist

## 2019-04-25 ENCOUNTER — Ambulatory Visit: Payer: Self-pay | Attending: Family Medicine | Admitting: Pharmacist

## 2019-04-25 ENCOUNTER — Other Ambulatory Visit: Payer: Self-pay

## 2019-04-25 VITALS — BP 102/64

## 2019-04-25 DIAGNOSIS — I10 Essential (primary) hypertension: Secondary | ICD-10-CM

## 2019-04-25 DIAGNOSIS — R7989 Other specified abnormal findings of blood chemistry: Secondary | ICD-10-CM

## 2019-04-25 NOTE — Progress Notes (Signed)
   S:    PCP: Kristy Flores   Patient arrives in good spirits. Presents to the clinic for BP check.  Patient was referred and last seen by Primary Care Provider on 04/12/19. BP was borderline low; Kristy Flores decreased lisinopril to 5 mg daily.    Patient reports adherence with medications.  Current BP Medications include:  Lisinopril 5 mg daily  Dietary habits include: limits salt; endorses drinking tea daily Exercise habits include: denies  Family / Social history:  - FHx: DM (mother) - Tobacco: never smoker  - Alcohol: none currently   O:  L arm after 5 minutes rest: 102/64  Home BP readings: not given   Last 3 Office BP readings: BP Readings from Last 3 Encounters:  04/25/19 102/64  04/12/19 96/61  09/29/18 139/83   BMET    Component Value Date/Time   NA 139 04/25/2019 1611   K 5.3 (H) 04/25/2019 1611   CL 103 04/25/2019 1611   CO2 21 04/25/2019 1611   GLUCOSE 151 (H) 04/25/2019 1611   BUN 85 (HH) 04/25/2019 1611   CREATININE 2.23 (H) 04/25/2019 1611   CALCIUM 9.5 04/25/2019 1611   GFRNONAA 23 (L) 04/25/2019 1611   GFRAA 27 (L) 04/25/2019 1611   Renal function: CrCl cannot be calculated (Unknown ideal weight.).  Clinical ASCVD: No  The 10-year ASCVD risk score Mikey Bussing DC Jr., et al., 2013) is: 8.6%   Values used to calculate the score:     Age: 43 years     Sex: Female     Is Non-Hispanic African American: No     Diabetic: Yes     Tobacco smoker: No     Systolic Blood Pressure: [id number] mmHg     Is BP treated: Yes     HDL Cholesterol: 35 mg/dL     Total Cholesterol: 227 mg/dL  A/P: Hypertension longstanding currently controlled on current medications. BP Goal = <130/80 mmHg. Patient is adherent with current medications.  -Continued current regimen.  -F/u labs ordered - BMP per PCP -Counseled on lifestyle modifications for blood pressure control including reduced dietary sodium, increased exercise, adequate sleep  Results reviewed and written information provided.    Total time in face-to-face counseling 15 minutes.   F/U Clinic Visit with PCP.    Benard Halsted, PharmD, [place] [phone]

## 2019-04-26 ENCOUNTER — Encounter: Payer: Self-pay | Admitting: Pharmacist

## 2019-04-26 LAB — BASIC METABOLIC PANEL
BUN/Creatinine Ratio: 38 — ABNORMAL HIGH (ref 12–28)
BUN: 85 mg/dL (ref 8–27)
CO2: 21 mmol/L (ref 20–29)
Calcium: 9.5 mg/dL (ref 8.7–10.3)
Chloride: 103 mmol/L (ref 96–106)
Creatinine, Ser: 2.23 mg/dL — ABNORMAL HIGH (ref 0.57–1.00)
GFR calc Af Amer: 27 mL/min/{1.73_m2} — ABNORMAL LOW (ref >59)
GFR calc non Af Amer: 23 mL/min/{1.73_m2} — ABNORMAL LOW (ref >59)
Glucose: 151 mg/dL — ABNORMAL HIGH (ref 65–99)
Potassium: 5.3 mmol/L — ABNORMAL HIGH (ref 3.5–5.2)
Sodium: 139 mmol/L (ref 134–144)

## 2019-04-29 ENCOUNTER — Other Ambulatory Visit: Payer: Self-pay

## 2019-04-29 DIAGNOSIS — R7989 Other specified abnormal findings of blood chemistry: Secondary | ICD-10-CM

## 2019-05-03 ENCOUNTER — Encounter: Payer: Self-pay | Admitting: Nurse Practitioner

## 2019-05-03 ENCOUNTER — Other Ambulatory Visit: Payer: Self-pay

## 2019-05-03 ENCOUNTER — Ambulatory Visit: Payer: Self-pay | Attending: Nurse Practitioner | Admitting: Nurse Practitioner

## 2019-05-03 DIAGNOSIS — F331 Major depressive disorder, recurrent, moderate: Secondary | ICD-10-CM

## 2019-05-03 MED ORDER — BD PEN NEEDLE MINI U/F 31G X 5 MM MISC: 1 refills | Status: DC

## 2019-05-03 MED ORDER — TRUEPLUS LANCETS 28G MISC: 3 refills | Status: DC

## 2019-05-03 MED ORDER — TRUE METRIX METER W/DEVICE KIT: PACK | 0 refills | Status: DC

## 2019-05-03 MED ORDER — INSULIN GLARGINE 100 UNIT/ML SOLOSTAR PEN: 20.0000 [IU] | PEN_INJECTOR | Freq: Every day | SUBCUTANEOUS | 11 refills | Status: DC

## 2019-05-03 MED ORDER — TRUE METRIX BLOOD GLUCOSE TEST VI STRP: ORAL_STRIP | 12 refills | Status: DC

## 2019-05-03 MED FILL — TRUEPLUS PEN NDL 31G X 1/4: 31G X 6 MM | 25 days supply | Qty: 100 | Fill #0

## 2019-05-03 MED FILL — TRUE METRIX GLUCOSE TEST ST: 50 days supply | Qty: 100 | Fill #0

## 2019-05-03 MED FILL — !LANTUS SOLOSTAR 100UNITS/M: 100 | 30 days supply | Qty: 6 | Fill #0

## 2019-05-03 MED FILL — !TRUE METRIX BLOOD GLUCOSE: 1 days supply | Qty: 1 | Fill #0

## 2019-05-03 MED FILL — TRUEplus LANCETS 28G MISC: 50 days supply | Qty: 100 | Fill #0

## 2019-05-03 NOTE — Progress Notes (Signed)
Virtual Visit via Telephone Note Due to national recommendations of social distancing due to [place] 19, telehealth visit is felt to be most appropriate for this patient at this time.  I discussed the limitations, risks, security and privacy concerns of performing an evaluation and management service by telephone and the availability of in person appointments. I also discussed with the patient that there may be a patient responsible charge related to this service. The patient expressed understanding and agreed to proceed.    I connected with Kristy Flores on 05/03/19  at   2:10 PM EDT  EDT by telephone and verified that I am speaking with the correct person using two identifiers.   Consent I discussed the limitations, risks, security and privacy concerns of performing an evaluation and management service by telephone and the availability of in person appointments. I also discussed with the patient that there may be a patient responsible charge related to this service. The patient expressed understanding and agreed to proceed.   Location of Patient: Private  Residence   Location of Provider: [place] and [place] participating in Telemedicine visit: Geryl Rankins FNP-BC [place]    History of Present Illness: Telemedicine visit for: Depression  I started [place] on Paxil on 9-22 for depression and anxiety. She was currently taking cymbalta 30 mg BID for pain however there had been no improvement in her mood lability. Today she notes improved mood stability. Denies any thoughts of self harm. At this time she will continue on cymbalta and paxil 60m daily.  [facility] 05/03/2019 04/12/2019 09/29/2018  Decreased Interest 0 3 0  Down, Depressed, Hopeless 0 3 2  PHQ - 2 Score 0 6 2  Altered sleeping 3 3 3   Tired, decreased energy 0 3 3  Change in appetite 0 2 3  Feeling bad or failure about yourself  0 2 3  Trouble concentrating 0 3  2  Moving slowly or fidgety/restless 0 3 0  Suicidal thoughts 0 2 0  PHQ-9 Score 3 24 16     Past Medical History:  Diagnosis Date  . Depression   . Diabetes mellitus without complication ([place]   . Hyperlipidemia   . Hypertension   . Insomnia   . Restless leg syndrome     Past Surgical History:  Procedure Laterality Date  . ABDOMINAL HYSTERECTOMY    . MELANOMA EXCISION  1985    Family History  Problem Relation Age of Onset  . Diabetes Mother   . Cancer Father     Social History   Socioeconomic History  . Marital status: Divorced    Spouse name: Not on file  . Number of children: Not on file  . Years of education: Not on file  . Highest education level: Not on file  Occupational History  . Not on file  Social Needs  . Financial resource strain: Not on file  . Food insecurity    Worry: Not on file    Inability: Not on file  . Transportation needs    Medical: Not on file    Non-medical: Not on file  Tobacco Use  . Smoking status: Never Smoker  . Smokeless tobacco: Never Used  Substance and Sexual Activity  . Alcohol use: Not Currently  . Drug use: Not Currently  . Sexual activity: Not Currently  Lifestyle  . Physical activity    Days per week: Not on file    Minutes per session:  Not on file  . Stress: Not on file  Relationships  . Social Herbalist on phone: Not on file    Gets together: Not on file    Attends religious service: Not on file    Active member of club or organization: Not on file    Attends meetings of clubs or organizations: Not on file    Relationship status: Not on file  Other Topics Concern  . Not on file  Social History Narrative  . Not on file     Observations/Objective: Awake, alert and oriented x 3   Review of Systems  Constitutional: Negative for fever, malaise/fatigue and weight loss.  HENT: Negative.  Negative for nosebleeds.   Eyes: Negative.  Negative for blurred vision, double vision and photophobia.   Respiratory: Negative.  Negative for cough and shortness of breath.   Cardiovascular: Negative.  Negative for chest pain, palpitations and leg swelling.  Gastrointestinal: Negative.  Negative for heartburn, nausea and vomiting.  Musculoskeletal: Negative.  Negative for myalgias.  Neurological: Negative.  Negative for dizziness, focal weakness, seizures and headaches.  Psychiatric/Behavioral: Positive for depression. Negative for suicidal ideas. The patient is nervous/anxious.     Assessment and Plan: Karis was seen today for follow-up.  Diagnoses and all orders for this visit:  Moderate episode of recurrent major depressive disorder (HCC) -     PARoxetine (PAXIL) 20 MG tablet; Take 1 tablet (20 mg total) by mouth daily.  Other orders -     Insulin Glargine (LANTUS) 100 UNIT/ML Solostar Pen; Inject 20 Units into the skin at bedtime. Increase lantus by 2 units every 3 days if fasting blood glucose >130 -     Insulin Pen Needle (B-D UF III MINI PEN NEEDLES) 31G X 5 MM MISC; Use as instructed. Inject into the skin once nightly. -     Blood Glucose Monitoring Suppl (TRUE METRIX METER) w/Device KIT; Use as instructed. Check blood glucose level by fingerstick twice per day. -     glucose blood (TRUE METRIX BLOOD GLUCOSE TEST) test strip; Use as instructed -     TRUEplus Lancets 28G MISC; Use as instructed. Check blood glucose level by fingerstick twice per day.     Follow Up Instructions Return in about 2 months (around 07/11/2019).     I discussed the assessment and treatment plan with the patient. The patient was provided an opportunity to ask questions and all were answered. The patient agreed with the plan and demonstrated an understanding of the instructions.   The patient was advised to call back or seek an in-person evaluation if the symptoms worsen or if the condition fails to improve as anticipated.  I provided 14 minutes of non-face-to-face time during this encounter including  median intraservice time, reviewing previous notes, labs, imaging, medications and explaining diagnosis and management.  Gildardo Pounds, FNP-BC

## 2019-05-04 ENCOUNTER — Encounter: Payer: Self-pay | Admitting: Nurse Practitioner

## 2019-05-04 ENCOUNTER — Other Ambulatory Visit: Payer: Medicaid Other

## 2019-05-04 MED ORDER — PAROXETINE HCL 20 MG PO TABS: 20.0000 mg | ORAL_TABLET | Freq: Every day | ORAL | 1 refills | Status: DC

## 2019-05-05 MED FILL — PARoxetine HCL 20 MG TABS: 20 | 30 days supply | Qty: 30 | Fill #0

## 2019-05-10 ENCOUNTER — Other Ambulatory Visit: Payer: Medicaid Other

## 2019-05-11 ENCOUNTER — Other Ambulatory Visit: Payer: Self-pay | Admitting: Nurse Practitioner

## 2019-05-11 DIAGNOSIS — G894 Chronic pain syndrome: Secondary | ICD-10-CM

## 2019-05-11 MED FILL — DULoxetine HCL 30 MG CPEP: 30 | 30 days supply | Qty: 60 | Fill #0

## 2019-05-11 MED FILL — LOVASTATIN 40 MG TABS: 40 | 30 days supply | Qty: 30 | Fill #1

## 2019-05-11 MED FILL — LISINOPRIL 5 MG TABLET: 5 | 30 days supply | Qty: 30 | Fill #1

## 2019-05-11 MED FILL — FUROSEMIDE 20 MG TABS: 20 | 30 days supply | Qty: 30 | Fill #1

## 2019-05-11 MED FILL — ALLOPURINOL 100 MG TABLET: 100 | 30 days supply | Qty: 60 | Fill #1

## 2019-05-19 ENCOUNTER — Ambulatory Visit: Payer: Medicaid Other | Attending: Nurse Practitioner

## 2019-05-19 ENCOUNTER — Other Ambulatory Visit: Payer: Self-pay

## 2019-05-19 DIAGNOSIS — R7989 Other specified abnormal findings of blood chemistry: Secondary | ICD-10-CM

## 2019-05-20 LAB — BASIC METABOLIC PANEL
BUN/Creatinine Ratio: 35 — ABNORMAL HIGH (ref 12–28)
BUN: 50 mg/dL — ABNORMAL HIGH (ref 8–27)
CO2: 18 mmol/L — ABNORMAL LOW (ref 20–29)
Calcium: 9.6 mg/dL (ref 8.7–10.3)
Chloride: 108 mmol/L — ABNORMAL HIGH (ref 96–106)
Creatinine, Ser: 1.42 mg/dL — ABNORMAL HIGH (ref 0.57–1.00)
GFR calc Af Amer: 46 mL/min/{1.73_m2} — ABNORMAL LOW (ref >59)
GFR calc non Af Amer: 40 mL/min/{1.73_m2} — ABNORMAL LOW (ref >59)
Glucose: 146 mg/dL — ABNORMAL HIGH (ref 65–99)
Potassium: 5.2 mmol/L (ref 3.5–5.2)
Sodium: 138 mmol/L (ref 134–144)

## 2019-05-27 MED FILL — ?BASAGLAR 100 UNITS/ML KWPE: 100 | 30 days supply | Qty: 6 | Fill #0

## 2019-05-31 ENCOUNTER — Ambulatory Visit: Payer: Medicaid Other | Admitting: Pharmacist

## 2019-06-10 ENCOUNTER — Other Ambulatory Visit: Payer: Self-pay | Admitting: Nurse Practitioner

## 2019-06-10 DIAGNOSIS — M1A09X0 Idiopathic chronic gout, multiple sites, without tophus (tophi): Secondary | ICD-10-CM

## 2019-06-10 DIAGNOSIS — G894 Chronic pain syndrome: Secondary | ICD-10-CM

## 2019-06-10 MED FILL — DULoxetine HCL 30 MG CPEP: 30 | 30 days supply | Qty: 60 | Fill #0

## 2019-06-10 MED FILL — ?ALLOPURINOL 100MG TABLET: 100 | 30 days supply | Qty: 60 | Fill #0

## 2019-06-21 MED FILL — ?BASAGLAR 100 UNITS/ML KWPE: 100 | 30 days supply | Qty: 6 | Fill #1

## 2019-06-30 ENCOUNTER — Encounter: Payer: Self-pay | Admitting: Nurse Practitioner

## 2019-06-30 ENCOUNTER — Other Ambulatory Visit: Payer: Self-pay | Admitting: Nurse Practitioner

## 2019-06-30 NOTE — Telephone Encounter (Signed)
Please advise patient if able, if not please schedule a sooner tele visit with you if appropriate and available.

## 2019-07-11 ENCOUNTER — Other Ambulatory Visit: Payer: Self-pay | Admitting: Nurse Practitioner

## 2019-07-11 ENCOUNTER — Encounter: Payer: Self-pay | Admitting: Nurse Practitioner

## 2019-07-11 ENCOUNTER — Ambulatory Visit: Payer: Self-pay | Attending: Nurse Practitioner | Admitting: Nurse Practitioner

## 2019-07-11 ENCOUNTER — Other Ambulatory Visit: Payer: Self-pay

## 2019-07-11 ENCOUNTER — Other Ambulatory Visit: Payer: Self-pay | Admitting: Family Medicine

## 2019-07-11 DIAGNOSIS — G894 Chronic pain syndrome: Secondary | ICD-10-CM

## 2019-07-11 DIAGNOSIS — E785 Hyperlipidemia, unspecified: Secondary | ICD-10-CM

## 2019-07-11 DIAGNOSIS — I10 Essential (primary) hypertension: Secondary | ICD-10-CM

## 2019-07-11 DIAGNOSIS — E119 Type 2 diabetes mellitus without complications: Secondary | ICD-10-CM

## 2019-07-11 DIAGNOSIS — Z794 Long term (current) use of insulin: Secondary | ICD-10-CM

## 2019-07-11 DIAGNOSIS — M1A09X0 Idiopathic chronic gout, multiple sites, without tophus (tophi): Secondary | ICD-10-CM

## 2019-07-11 MED ORDER — BD PEN NEEDLE MINI U/F 31G X 5 MM MISC: 1 refills | Status: DC

## 2019-07-11 MED ORDER — INSULIN GLARGINE 100 UNIT/ML SOLOSTAR PEN: 20.0000 [IU] | PEN_INJECTOR | Freq: Two times a day (BID) | SUBCUTANEOUS | 11 refills | Status: DC

## 2019-07-11 MED ORDER — DULOXETINE HCL 30 MG PO CPEP: 30.0000 mg | ORAL_CAPSULE | Freq: Two times a day (BID) | ORAL | 2 refills | Status: DC

## 2019-07-11 MED ORDER — ALLOPURINOL 100 MG PO TABS: 200.0000 mg | ORAL_TABLET | Freq: Every day | ORAL | 2 refills | Status: DC

## 2019-07-11 MED FILL — TRUEPLUS 5-BEVEL PEN NEEDLE: 31G X 5 MM | 100 days supply | Qty: 100 | Fill #0

## 2019-07-11 MED FILL — TRUEPLUS PEN NDL 31G X 1/4": 31G X 6 MM | 25 days supply | Qty: 100 | Fill #1

## 2019-07-11 MED FILL — ?ALLOPURINOL 100MG TABLET: 100 | 30 days supply | Qty: 60 | Fill #0

## 2019-07-11 MED FILL — ?FUROSEMIDE 20MG TABLET: 20 | 30 days supply | Qty: 30 | Fill #2

## 2019-07-11 MED FILL — LISINOPRIL 5 MG TABLET: 5 | 30 days supply | Qty: 30 | Fill #2

## 2019-07-11 MED FILL — LOVASTATIN 40 MG TABS: 40 | 30 days supply | Qty: 30 | Fill #2

## 2019-07-11 MED FILL — ?BASAGLAR 100 UNITS/ML KWPE: 100 | 30 days supply | Qty: 6 | Fill #2

## 2019-07-11 MED FILL — DULoxetine HCL 30 MG CPEP: 30 | 30 days supply | Qty: 60 | Fill #0

## 2019-07-11 MED FILL — TRUEPLUS PEN NDL 31G X 1/4: 31G X 6 MM | 25 days supply | Qty: 100 | Fill #1

## 2019-07-11 NOTE — Progress Notes (Signed)
Virtual Visit via Telephone Note Due to national recommendations of social distancing due to [place] 19, telehealth visit is felt to be most appropriate for this patient at this time.  I discussed the limitations, risks, security and privacy concerns of performing an evaluation and management service by telephone and the availability of in person appointments. I also discussed with the patient that there may be a patient responsible charge related to this service. The patient expressed understanding and agreed to proceed.    I connected with Kristy Flores on 07/11/19  at   2:10 PM EST  EDT by telephone and verified that I am speaking with the correct person using two identifiers.   Consent I discussed the limitations, risks, security and privacy concerns of performing an evaluation and management service by telephone and the availability of in person appointments. I also discussed with the patient that there may be a patient responsible charge related to this service. The patient expressed understanding and agreed to proceed.   Location of Patient: Private Residence  Location of Provider: [place] and [employer] Office    Persons participating in Telemedicine visit: Kristy Rankins FNP-BC [place]    History of Present Illness: Telemedicine visit for: Follow up  has a past medical history of Depression, Diabetes mellitus without complication ([place]), Hyperlipidemia, Hypertension, Insomnia, and Restless leg syndrome.   DM TYPE 2 Fasting glucose: 76-95. Post prandial: Does not check.  She is administering lantus 20 units in the am and 30 units in the pm. I have instructed her to decrease night time lantus to 20 units as well due to lower glucose readings in the am.  Other medications include statin and low-dose ACE.  She denies any hyperglycemic symptoms. Lab Results  Component Value Date   HGBA1C 6.4 (A) 04/12/2019   Blood pressure is well controlled.  She was  initially on 10 mg of lisinopril however we decrease that to 5 mg due to lower blood pressure readings. Denies chest pain, shortness of breath, palpitations, lightheadedness, dizziness, headaches or BLE edema.  BP Readings from Last 3 Encounters:  04/25/19 102/64  04/12/19 96/61  09/29/18 139/83   LDL is not at goal however she does endorse medication compliance taking lovastatin 40 mg daily.  She denies any statin intolerance or myalgias. Lab Results  Component Value Date   LDLCALC 131 (H) 04/12/2019       Past Medical History:  Diagnosis Date  . Depression   . Diabetes mellitus without complication ([place])   . Hyperlipidemia   . Hypertension   . Insomnia   . Restless leg syndrome     Past Surgical History:  Procedure Laterality Date  . ABDOMINAL HYSTERECTOMY    . MELANOMA EXCISION  1985    Family History  Problem Relation Age of Onset  . Diabetes Mother   . Cancer Father     Social History   Socioeconomic History  . Marital status: Divorced    Spouse name: Not on file  . Number of children: Not on file  . Years of education: Not on file  . Highest education level: Not on file  Occupational History  . Not on file  Tobacco Use  . Smoking status: Never Smoker  . Smokeless tobacco: Never Used  Substance and Sexual Activity  . Alcohol use: Not Currently  . Drug use: Not Currently  . Sexual activity: Not Currently  Other Topics Concern  . Not on file  Social History Narrative  .  Not on file   Social Determinants of Health   Financial Resource Strain:   . Difficulty of Paying Living Expenses: Not on file  Food Insecurity:   . Worried About Charity fundraiser in the Last Year: Not on file  . Ran Out of Food in the Last Year: Not on file  Transportation Needs:   . Lack of Transportation (Medical): Not on file  . Lack of Transportation (Non-Medical): Not on file  Physical Activity:   . Days of Exercise per Week: Not on file  . Minutes of Exercise per  Session: Not on file  Stress:   . Feeling of Stress : Not on file  Social Connections:   . Frequency of Communication with Friends and Family: Not on file  . Frequency of Social Gatherings with Friends and Family: Not on file  . Attends Religious Services: Not on file  . Active Member of Clubs or Organizations: Not on file  . Attends Archivist Meetings: Not on file  . Marital Status: Not on file     Observations/Objective: Awake, alert and oriented x 3   Review of Systems  Constitutional: Negative for fever, malaise/fatigue and weight loss.  HENT: Negative.  Negative for nosebleeds.   Eyes: Negative.  Negative for blurred vision, double vision and photophobia.  Respiratory: Negative.  Negative for cough and shortness of breath.   Cardiovascular: Negative.  Negative for chest pain, palpitations and leg swelling.  Gastrointestinal: Negative.  Negative for heartburn, nausea and vomiting.  Musculoskeletal: Positive for joint pain (chronic gout) and myalgias.       Chronic RLS  Neurological: Negative.  Negative for dizziness, focal weakness, seizures and headaches.  Psychiatric/Behavioral: Negative.  Negative for suicidal ideas.    Assessment and Plan: Kristy Flores was seen today for follow-up.  Diagnoses and all orders for this visit:  Controlled type 2 diabetes mellitus without complication, with long-term current use of insulin (HCC) -     Insulin Glargine (LANTUS) 100 UNIT/ML Solostar Pen; Inject 20 Units into the skin 2 (two) times daily. Increase night time lantus by 2 units every 3 days if fasting blood glucose >130. Decrease nighttime lantus by 2 units every 3 days for Fasting blood glucose less than 90. -     Insulin Pen Needle (B-D UF III MINI PEN NEEDLES) 31G X 5 MM MISC; Use as instructed. Inject into the skin once nightly.  Essential hypertension Continue all antihypertensives as prescribed.  Remember to bring in your blood pressure log with you for your follow up  appointment.  DASH/Mediterranean Diets are healthier choices for HTN.    Dyslipidemia, goal LDL below 70 INSTRUCTIONS: Work on a low fat, heart healthy diet and participate in regular aerobic exercise program by working out at least 150 minutes per week; 5 days a week-30 minutes per day. Avoid red meat/beef/steak,  fried foods. junk foods, sodas, sugary drinks, unhealthy snacking, alcohol and smoking.  Drink at least 80 oz of water per day and monitor your carbohydrate intake daily.   Chronic pain syndrome -     DULoxetine (CYMBALTA) 30 MG capsule; Take 1 capsule (30 mg total) by mouth 2 (two) times daily.  Chronic gout of multiple sites, unspecified cause -     allopurinol (ZYLOPRIM) 100 MG tablet; Take 2 tablets (200 mg total) by mouth daily.     Follow Up Instructions Return in about 3 months (around 10/09/2019).     I discussed the assessment and treatment plan with the  patient. The patient was provided an opportunity to ask questions and all were answered. The patient agreed with the plan and demonstrated an understanding of the instructions.   The patient was advised to call back or seek an in-person evaluation if the symptoms worsen or if the condition fails to improve as anticipated.  I provided 20 minutes of non-face-to-face time during this encounter including median intraservice time, reviewing previous notes, labs, imaging, medications and explaining diagnosis and management.  Gildardo Pounds, FNP-BC

## 2019-08-09 MED FILL — ?BASAGLAR 100 UNITS/ML KWPE: 100 | 30 days supply | Qty: 6 | Fill #3

## 2019-08-09 MED FILL — LISINOPRIL 5 MG TABLET: 5 | 30 days supply | Qty: 30 | Fill #3

## 2019-08-09 MED FILL — DULoxetine HCL 30 MG CPEP: 30 | 30 days supply | Qty: 60 | Fill #1

## 2019-08-12 MED FILL — ?ALLOPURINOL 100MG TABLET: 100 | 30 days supply | Qty: 60 | Fill #1

## 2019-08-29 MED FILL — ?BASAGLAR 100 UNITS/ML KWPE: 100 | 30 days supply | Qty: 12 | Fill #0

## 2019-09-07 MED FILL — ?ALLOPURINOL 100MG TABLET: 100 | 30 days supply | Qty: 60 | Fill #2

## 2019-09-07 MED FILL — DULoxetine HCL 30 MG CPEP: 30 | 30 days supply | Qty: 60 | Fill #2

## 2019-09-07 MED FILL — LISINOPRIL 5 MG TABLET: 5 | 30 days supply | Qty: 30 | Fill #4

## 2019-09-07 MED FILL — ?LOVASTATIN 40MG TABS: 40 | 30 days supply | Qty: 30 | Fill #3

## 2019-09-30 MED FILL — ?BASAGLAR 100 UNITS/ML KWPE: 100 | 30 days supply | Qty: 12 | Fill #1

## 2019-10-10 ENCOUNTER — Other Ambulatory Visit: Payer: Self-pay

## 2019-10-10 ENCOUNTER — Other Ambulatory Visit: Payer: Self-pay | Admitting: Nurse Practitioner

## 2019-10-10 ENCOUNTER — Ambulatory Visit: Payer: Self-pay | Attending: Nurse Practitioner | Admitting: Nurse Practitioner

## 2019-10-10 ENCOUNTER — Encounter: Payer: Self-pay | Admitting: Nurse Practitioner

## 2019-10-10 VITALS — BP 124/75 | HR 92 | Temp 97.7°F | Ht 62.0 in | Wt 255.0 lb

## 2019-10-10 DIAGNOSIS — I10 Essential (primary) hypertension: Secondary | ICD-10-CM

## 2019-10-10 DIAGNOSIS — F329 Major depressive disorder, single episode, unspecified: Secondary | ICD-10-CM | POA: Insufficient documentation

## 2019-10-10 DIAGNOSIS — R7989 Other specified abnormal findings of blood chemistry: Secondary | ICD-10-CM

## 2019-10-10 DIAGNOSIS — Z7901 Long term (current) use of anticoagulants: Secondary | ICD-10-CM | POA: Insufficient documentation

## 2019-10-10 DIAGNOSIS — E1165 Type 2 diabetes mellitus with hyperglycemia: Secondary | ICD-10-CM

## 2019-10-10 DIAGNOSIS — G894 Chronic pain syndrome: Secondary | ICD-10-CM

## 2019-10-10 DIAGNOSIS — M1A9XX0 Chronic gout, unspecified, without tophus (tophi): Secondary | ICD-10-CM | POA: Insufficient documentation

## 2019-10-10 DIAGNOSIS — E559 Vitamin D deficiency, unspecified: Secondary | ICD-10-CM

## 2019-10-10 DIAGNOSIS — Z79899 Other long term (current) drug therapy: Secondary | ICD-10-CM | POA: Insufficient documentation

## 2019-10-10 DIAGNOSIS — Z833 Family history of diabetes mellitus: Secondary | ICD-10-CM | POA: Insufficient documentation

## 2019-10-10 DIAGNOSIS — M1A09X0 Idiopathic chronic gout, multiple sites, without tophus (tophi): Secondary | ICD-10-CM

## 2019-10-10 DIAGNOSIS — E11649 Type 2 diabetes mellitus with hypoglycemia without coma: Secondary | ICD-10-CM | POA: Insufficient documentation

## 2019-10-10 DIAGNOSIS — Z794 Long term (current) use of insulin: Secondary | ICD-10-CM | POA: Insufficient documentation

## 2019-10-10 DIAGNOSIS — E785 Hyperlipidemia, unspecified: Secondary | ICD-10-CM

## 2019-10-10 DIAGNOSIS — G2581 Restless legs syndrome: Secondary | ICD-10-CM | POA: Insufficient documentation

## 2019-10-10 LAB — POCT GLYCOSYLATED HEMOGLOBIN (HGB A1C): Hemoglobin A1C: 7.4 % — AB (ref 4.0–5.6)

## 2019-10-10 LAB — GLUCOSE, POCT (MANUAL RESULT ENTRY): POC Glucose: 175 mg/dl — AB (ref 70–99)

## 2019-10-10 MED ORDER — INSULIN GLARGINE 100 UNIT/ML SOLOSTAR PEN: 40.0000 [IU] | PEN_INJECTOR | Freq: Every day | SUBCUTANEOUS | 11 refills | Status: DC

## 2019-10-10 MED ORDER — LISINOPRIL 5 MG PO TABS: 5.0000 mg | ORAL_TABLET | Freq: Every day | ORAL | 2 refills | Status: DC

## 2019-10-10 MED ORDER — GLIMEPIRIDE 2 MG PO TABS: 2.0000 mg | ORAL_TABLET | Freq: Every day | ORAL | 3 refills | Status: DC

## 2019-10-10 MED ORDER — LOVASTATIN 40 MG PO TABS: 40.0000 mg | ORAL_TABLET | Freq: Every day | ORAL | 2 refills | Status: DC

## 2019-10-10 MED ORDER — BD PEN NEEDLE MINI U/F 31G X 5 MM MISC: 1 refills | Status: DC

## 2019-10-10 MED ORDER — ALLOPURINOL 100 MG PO TABS: 200.0000 mg | ORAL_TABLET | Freq: Every day | ORAL | 2 refills | Status: DC

## 2019-10-10 MED ORDER — TRUE METRIX BLOOD GLUCOSE TEST VI STRP: ORAL_STRIP | 12 refills | Status: DC

## 2019-10-10 MED ORDER — TRUEPLUS LANCETS 28G MISC: 3 refills | Status: DC

## 2019-10-10 MED ORDER — DULOXETINE HCL 30 MG PO CPEP: 30.0000 mg | ORAL_CAPSULE | Freq: Two times a day (BID) | ORAL | 2 refills | Status: DC

## 2019-10-10 MED FILL — GLIMEPIRIDE 2 MG TABS: 2 | 30 days supply | Qty: 30 | Fill #0

## 2019-10-10 MED FILL — ?ALLOPURINOL 100MG TABLET: 100 | 30 days supply | Qty: 60 | Fill #3

## 2019-10-10 MED FILL — LISINOPRIL 5 MG TABLET: 5 | 30 days supply | Qty: 30 | Fill #5

## 2019-10-10 MED FILL — TRUE METRIX TEST STRIP: 50 days supply | Qty: 100 | Fill #0

## 2019-10-10 MED FILL — DULoxetine HCL 30 MG CPEP: 30 | 30 days supply | Qty: 60 | Fill #0

## 2019-10-10 MED FILL — TRUEplus LANCETS 28G MISC: 50 days supply | Qty: 100 | Fill #0

## 2019-10-10 MED FILL — LOVASTATIN 40 MG TABS: 40 | 30 days supply | Qty: 30 | Fill #4

## 2019-10-10 MED FILL — TRUEPLUS 5-BEVEL PEN NEEDLE: 31G X 5 MM | 90 days supply | Qty: 100 | Fill #1

## 2019-10-10 NOTE — Progress Notes (Signed)
Assessment & Plan:  Kristy Flores was seen today for follow-up.  Diagnoses and all orders for this visit:  Controlled type 2 diabetes mellitus with hyperglycemia, with long-term current use of insulin (HCC) -     Glucose (CBG) -     HgB A1c -     insulin glargine (LANTUS) 100 UNIT/ML Solostar Pen; Inject 40 Units into the skin at bedtime. Increase night time lantus by 2 units every 3 days if fasting blood glucose >130. Decrease nighttime lantus by 2 units every 3 days for Fasting blood glucose less than 90. -     Insulin Pen Needle (B-D UF III MINI PEN NEEDLES) 31G X 5 MM MISC; Use as instructed. Inject into the skin once nightly. -     TRUEplus Lancets 28G MISC; Use as instructed. Check blood glucose level by fingerstick twice per day. -     glucose blood (TRUE METRIX BLOOD GLUCOSE TEST) test strip; Use as instructed -     glimepiride (AMARYL) 2 MG tablet; Take 1 tablet (2 mg total) by mouth daily before breakfast. Continue blood sugar control as discussed in office today, low carbohydrate diet, and regular physical exercise as tolerated, 150 minutes per week (30 min each day, 5 days per week, or 50 min 3 days per week). Keep blood sugar logs with fasting goal of 90-130 mg/dl, post prandial (after you eat) less than 180.  For Hypoglycemia: BS <60 and Hyperglycemia BS >400; contact the clinic ASAP. Annual eye exams and foot exams are recommended.   Essential hypertension -     lisinopril (ZESTRIL) 5 MG tablet; Take 1 tablet (5 mg total) by mouth daily. Continue all antihypertensives as prescribed.  Remember to bring in your blood pressure log with you for your follow up appointment.  DASH/Mediterranean Diets are healthier choices for HTN.    Dyslipidemia, goal LDL below 70 -     lovastatin (MEVACOR) 40 MG tablet; Take 1 tablet (40 mg total) by mouth at bedtime. -     Lipid panel INSTRUCTIONS: Work on a low fat, heart healthy diet and participate in regular aerobic exercise program by working  out at least 150 minutes per week; 5 days a week-30 minutes per day. Avoid red meat/beef/steak,  fried foods. junk foods, sodas, sugary drinks, unhealthy snacking, alcohol and smoking.  Drink at least 80 oz of water per day and monitor your carbohydrate intake daily.    Chronic pain syndrome -     DULoxetine (CYMBALTA) 30 MG capsule; Take 1 capsule (30 mg total) by mouth 2 (two) times daily.  Chronic gout of multiple sites, unspecified cause -     allopurinol (ZYLOPRIM) 100 MG tablet; Take 2 tablets (200 mg total) by mouth daily.  Elevated serum creatinine -     CBC -     CMP14+EGFR  Vitamin D deficiency disease -     VITAMIN D 25 Hydroxy (Vit-D Deficiency, Fractures)    Patient has been counseled on age-appropriate routine health concerns for screening and prevention. These are reviewed and up-to-date. Referrals have been placed accordingly. Immunizations are up-to-date or declined.    Subjective:   Chief Complaint  Patient presents with  . Follow-up    Pt. is here for DM and hypertension follow up.    HPI Kristy Flores 64 y.o. female presents to office today for follow up.  has a past medical history of Depression, Diabetes mellitus without complication ([place]), Hyperlipidemia, Hypertension, Insomnia, and Restless leg syndrome.   Endorses increased  stressors. Has tried several SSRIs in the past but states they were ineffective. Stressors include: worrying about her disabled grandson who currently has COVID and her sons who having been having some trouble with Event organiser.   HEALTH MAINTENANCE DECLINES: mammo, colonoscopy, FOBT/FITT  DM TYPE 2 Not well controlled today. Up from 6.2. Home readings: Fasting today 124. Average readings 174-190. Post prandial: 200s. She is doing the keto diet. Started one month ago. Mostly checking fasting readings. Few afternoon readings. Taking Lantus 20 units at 9am. 20 units 9 at night. Will switch to lantus 40 units at bedtime. Increase by 2  units every 3 days for fasting CBG >130. We have discontinued several medications due to her history of acute renal insufficiency. She is currently taking STATIN and renal dose ACE. Started on glimepiride 67m today.  Lab Results  Component Value Date   HGBA1C 7.4 (A) 10/10/2019    Blood pressure well controlled. She does endorse increased BLE swelling. Not present today. May resume lasix prn if creatinine okay. Denies chest pain, shortness of breath, palpitations, lightheadedness, dizziness, headaches BP Readings from Last 3 Encounters:  10/10/19 124/75  04/25/19 102/64  04/12/19 96/61    Dyslipidemia LDL not at goal of <70. She is currently taking lovastatin 40 mg daily. Denies statin intolerance. Lab Results  Component Value Date   LDLCALC 137 (H) 10/10/2019    Review of Systems  Constitutional: Negative for fever, malaise/fatigue and weight loss.  HENT: Negative.  Negative for nosebleeds.   Eyes: Negative.  Negative for blurred vision, double vision and photophobia.  Respiratory: Negative.  Negative for cough and shortness of breath.   Cardiovascular: Positive for leg swelling. Negative for chest pain and palpitations.  Gastrointestinal: Negative.  Negative for heartburn, nausea and vomiting.  Musculoskeletal: Negative.  Negative for myalgias.  Neurological: Negative.  Negative for dizziness, focal weakness, seizures and headaches.  Psychiatric/Behavioral: Positive for depression. Negative for suicidal ideas. The patient is nervous/anxious.     Past Medical History:  Diagnosis Date  . Depression   . Diabetes mellitus [place] complication ([place]   . Hyperlipidemia   . Hypertension   . Insomnia   . Restless leg syndrome     Past Surgical History:  Procedure Laterality Date  . ABDOMINAL HYSTERECTOMY    . MELANOMA EXCISION  1985    Family History  Problem Relation Age of Onset  . Diabetes Mother   . Cancer Father     Social History Reviewed with no changes to be  made today.   Outpatient Medications Prior to Visit  Medication Sig Dispense Refill  . Blood Glucose Monitoring Suppl (TRUE METRIX METER) w/Device KIT Use as instructed. Check blood glucose level by fingerstick twice per day. 1 kit 0  . DULoxetine (CYMBALTA) 30 MG capsule Take 1 capsule (30 mg total) by mouth 2 (two) times daily. 60 capsule 2  . glucose blood (TRUE METRIX BLOOD GLUCOSE TEST) test strip Use as instructed 100 each 12  . Insulin Pen Needle (B-D UF III MINI PEN NEEDLES) 31G X 5 MM MISC Use as instructed. Inject into the skin once nightly. 100 each 1  . TRUEplus Lancets 28G MISC Use as instructed. Check blood glucose level by fingerstick twice per day. 100 each 3  . ALPRAZolam (XANAX) 0.5 MG tablet Take 0.5 mg by mouth at bedtime as needed for anxiety.    .Marland Kitchenallopurinol (ZYLOPRIM) 100 MG tablet Take 2 tablets (200 mg total) by mouth daily. 180 tablet 2  .  Insulin Glargine (LANTUS) 100 UNIT/ML Solostar Pen Inject 20 Units into the skin 2 (two) times daily. Increase night time lantus by 2 units every 3 days if fasting blood glucose >130. Decrease nighttime lantus by 2 units every 3 days for Fasting blood glucose less than 90. 15 mL 11  . lisinopril (ZESTRIL) 5 MG tablet Take 1 tablet (5 mg total) by mouth daily. 90 tablet 2  . lovastatin (MEVACOR) 40 MG tablet Take 1 tablet (40 mg total) by mouth at bedtime. 90 tablet 2   No facility-administered medications prior to visit.    Allergies  Allergen Reactions  . Codeine     Vomit  . Gabapentin     AKI  . Penicillins     Broke out, stomach upset.   . Victoza [Liraglutide]     creatinine       Objective:    BP 124/75 (BP Location: Left Arm, Patient Position: Sitting, Cuff Size: Large)   Pulse 92   Temp 97.7 F (36.5 C) (Temporal)   Ht 5' 2"  (1.575 m)   Wt 255 lb (115.7 kg)   SpO2 98%   BMI 46.64 kg/m  Wt Readings from Last 3 Encounters:  10/10/19 255 lb (115.7 kg)  04/12/19 262 lb (118.8 kg)  09/29/18 263 lb 12.8 oz  (119.7 kg)    Physical Exam Vitals and nursing note reviewed.  Constitutional:      Appearance: She is well-developed.  HENT:     Head: Normocephalic and atraumatic.  Cardiovascular:     Rate and Rhythm: Normal rate and regular rhythm.     Heart sounds: Normal heart sounds. No murmur. No friction rub. No gallop.   Pulmonary:     Effort: Pulmonary effort is normal. No tachypnea or respiratory distress.     Breath sounds: Normal breath sounds. No decreased breath sounds, wheezing, rhonchi or rales.  Chest:     Chest wall: No tenderness.  Abdominal:     General: Bowel sounds are normal.     Palpations: Abdomen is soft.  Musculoskeletal:        General: Normal range of motion.     Cervical back: Normal range of motion.     Right lower leg: No edema.     Left lower leg: No edema.  Skin:    General: Skin is warm and dry.  Neurological:     Mental Status: She is alert and oriented to person, place, and time.     Coordination: Coordination normal.  Psychiatric:        Behavior: Behavior normal. Behavior is cooperative.        Thought Content: Thought content normal.        Judgment: Judgment normal.          Patient has been counseled extensively about nutrition and exercise as well as the importance of adherence with medications and regular follow-up. The patient was given clear instructions to go to ER or return to medical center if symptoms don't improve, worsen or new problems develop. The patient verbalized understanding.   Follow-up: Return in about 4 weeks (around 11/07/2019) for meter check with luke.   Gildardo Pounds, FNP-BC [facility] [place], [place]   10/11/2019, 10:16 AM

## 2019-10-11 ENCOUNTER — Encounter: Payer: Self-pay | Admitting: Nurse Practitioner

## 2019-10-11 LAB — CBC
Hematocrit: 38.1 % (ref 34.0–46.6)
Hemoglobin: 12.3 g/dL (ref 11.1–15.9)
MCH: 27.9 pg (ref 26.6–33.0)
MCHC: 32.3 g/dL (ref 31.5–35.7)
MCV: 86 fL (ref 79–97)
Platelets: 286 10*3/uL (ref 150–450)
RBC: 4.41 x10E6/uL (ref 3.77–5.28)
RDW: 14.8 % (ref 11.7–15.4)
WBC: 8.5 10*3/uL (ref 3.4–10.8)

## 2019-10-11 LAB — CMP14+EGFR
ALT: 15 IU/L (ref 0–32)
AST: 14 IU/L (ref 0–40)
Albumin/Globulin Ratio: 1.4 (ref 1.2–2.2)
Albumin: 4 g/dL (ref 3.8–4.8)
Alkaline Phosphatase: 105 IU/L (ref 39–117)
BUN/Creatinine Ratio: 29 — ABNORMAL HIGH (ref 12–28)
BUN: 44 mg/dL — ABNORMAL HIGH (ref 8–27)
Bilirubin Total: 0.2 mg/dL (ref 0.0–1.2)
CO2: 16 mmol/L — ABNORMAL LOW (ref 20–29)
Calcium: 9.9 mg/dL (ref 8.7–10.3)
Chloride: 111 mmol/L — ABNORMAL HIGH (ref 96–106)
Creatinine, Ser: 1.53 mg/dL — ABNORMAL HIGH (ref 0.57–1.00)
GFR calc Af Amer: 42 mL/min/{1.73_m2} — ABNORMAL LOW (ref >59)
GFR calc non Af Amer: 36 mL/min/{1.73_m2} — ABNORMAL LOW (ref >59)
Globulin, Total: 2.8 g/dL (ref 1.5–4.5)
Glucose: 130 mg/dL — ABNORMAL HIGH (ref 65–99)
Potassium: 5.3 mmol/L — ABNORMAL HIGH (ref 3.5–5.2)
Sodium: 142 mmol/L (ref 134–144)
Total Protein: 6.8 g/dL (ref 6.0–8.5)

## 2019-10-11 LAB — LIPID PANEL
Chol/HDL Ratio: 4.6 ratio — ABNORMAL HIGH (ref 0.0–4.4)
Cholesterol, Total: 219 mg/dL — ABNORMAL HIGH (ref 100–199)
HDL: 48 mg/dL (ref >39)
LDL Chol Calc (NIH): 137 mg/dL — ABNORMAL HIGH (ref 0–99)
Triglycerides: 188 mg/dL — ABNORMAL HIGH (ref 0–149)
VLDL Cholesterol Cal: 34 mg/dL (ref 5–40)

## 2019-10-11 LAB — VITAMIN D 25 HYDROXY (VIT D DEFICIENCY, FRACTURES): Vit D, 25-Hydroxy: 11.4 ng/mL — ABNORMAL LOW (ref 30.0–100.0)

## 2019-10-11 MED ORDER — FUROSEMIDE 20 MG PO TABS: 20.0000 mg | ORAL_TABLET | ORAL | 1 refills | Status: DC

## 2019-10-11 MED FILL — ?FUROSEMIDE 20 MG TABLET: 20 | 30 days supply | Qty: 15 | Fill #0

## 2019-10-12 ENCOUNTER — Encounter: Payer: Self-pay | Admitting: Nurse Practitioner

## 2019-10-12 ENCOUNTER — Other Ambulatory Visit: Payer: Self-pay | Admitting: Nurse Practitioner

## 2019-10-12 MED ORDER — VITAMIN D (ERGOCALCIFEROL) 1.25 MG (50000 UNIT) PO CAPS: 50000.0000 [IU] | ORAL_CAPSULE | ORAL | 1 refills | Status: DC

## 2019-10-12 MED FILL — ?ERGOCALCIFEROL 50000 UNITC: 1.25 MG | 84 days supply | Qty: 12 | Fill #0

## 2019-10-28 ENCOUNTER — Ambulatory Visit: Payer: Self-pay | Attending: Nurse Practitioner

## 2019-10-28 ENCOUNTER — Other Ambulatory Visit: Payer: Self-pay

## 2019-10-28 DIAGNOSIS — I10 Essential (primary) hypertension: Secondary | ICD-10-CM

## 2019-10-29 LAB — CMP14+EGFR
ALT: 10 IU/L (ref 0–32)
AST: 15 IU/L (ref 0–40)
Albumin/Globulin Ratio: 1.6 (ref 1.2–2.2)
Albumin: 3.9 g/dL (ref 3.8–4.8)
Alkaline Phosphatase: 98 IU/L (ref 39–117)
BUN/Creatinine Ratio: 32 — ABNORMAL HIGH (ref 12–28)
BUN: 67 mg/dL — ABNORMAL HIGH (ref 8–27)
Bilirubin Total: <0.2 mg/dL (ref 0.0–1.2)
CO2: 16 mmol/L — ABNORMAL LOW (ref 20–29)
Calcium: 9.5 mg/dL (ref 8.7–10.3)
Chloride: 105 mmol/L (ref 96–106)
Creatinine, Ser: 2.08 mg/dL — ABNORMAL HIGH (ref 0.57–1.00)
GFR calc Af Amer: 29 mL/min/{1.73_m2} — ABNORMAL LOW (ref >59)
GFR calc non Af Amer: 25 mL/min/{1.73_m2} — ABNORMAL LOW (ref >59)
Globulin, Total: 2.5 g/dL (ref 1.5–4.5)
Glucose: 139 mg/dL — ABNORMAL HIGH (ref 65–99)
Potassium: 5 mmol/L (ref 3.5–5.2)
Sodium: 142 mmol/L (ref 134–144)
Total Protein: 6.4 g/dL (ref 6.0–8.5)

## 2019-10-30 ENCOUNTER — Other Ambulatory Visit: Payer: Self-pay | Admitting: Nurse Practitioner

## 2019-10-30 DIAGNOSIS — N183 Chronic kidney disease, stage 3 unspecified: Secondary | ICD-10-CM

## 2019-10-30 DIAGNOSIS — E1122 Type 2 diabetes mellitus with diabetic chronic kidney disease: Secondary | ICD-10-CM

## 2019-11-02 MED FILL — ?BASAGLAR 100 UNITS/ML KWPE: 100 | 30 days supply | Qty: 12 | Fill #0

## 2019-11-07 ENCOUNTER — Ambulatory Visit: Payer: Medicaid Other | Admitting: Pharmacist

## 2019-11-08 MED FILL — GLIMEPIRIDE 2 MG TABS: 2 | 30 days supply | Qty: 30 | Fill #1

## 2019-11-08 MED FILL — LOVASTATIN 40 MG TABS: 40 | 30 days supply | Qty: 30 | Fill #5

## 2019-11-08 MED FILL — ?DULoxetine HCL 30MG CPEP: 30 | 30 days supply | Qty: 60 | Fill #1

## 2019-11-08 MED FILL — ?ALLOPURINOL 100MG TABLET: 100 | 30 days supply | Qty: 60 | Fill #4

## 2019-11-08 MED FILL — LISINOPRIL 5 MG TABLET: 5 | 30 days supply | Qty: 30 | Fill #6

## 2019-11-11 ENCOUNTER — Other Ambulatory Visit: Payer: Self-pay

## 2019-11-11 ENCOUNTER — Ambulatory Visit: Payer: Self-pay | Attending: Nurse Practitioner | Admitting: Pharmacist

## 2019-11-11 ENCOUNTER — Encounter: Payer: Self-pay | Admitting: Pharmacist

## 2019-11-11 DIAGNOSIS — Z794 Long term (current) use of insulin: Secondary | ICD-10-CM | POA: Insufficient documentation

## 2019-11-11 DIAGNOSIS — Z833 Family history of diabetes mellitus: Secondary | ICD-10-CM | POA: Insufficient documentation

## 2019-11-11 DIAGNOSIS — E119 Type 2 diabetes mellitus without complications: Secondary | ICD-10-CM | POA: Insufficient documentation

## 2019-11-11 DIAGNOSIS — N183 Chronic kidney disease, stage 3 unspecified: Secondary | ICD-10-CM

## 2019-11-11 DIAGNOSIS — E1122 Type 2 diabetes mellitus with diabetic chronic kidney disease: Secondary | ICD-10-CM

## 2019-11-11 NOTE — Progress Notes (Signed)
    S:    PCP: Zelda   No chief complaint on file.  Patient arrives in good spirits.  Presents for diabetes evaluation, education, and management Patient was referred and last seen by Primary Care Provider on 10/10/2019.     Family/Social History:  - FHx: DM - Tobacco: never smoker - Alcohol: not currently   Insurance coverage/medication affordability: Family Planning Medicaid; no rx coverage   Patient reports adherence with medications.  Current diabetes medications include: glimpeiride 4 mg daily; Lantus 40 units (had to decrease to 34 d/t hypoglycemia)   Patient reports hypoglycemic events. Several values 40-70s. Successfully treated; decreased Lantus to 34 units.   Patient reported dietary habits:  -Reports being on the Keto diet -Consumes a large amount of broccoli, asparagus   Patient-reported exercise habits:  - Active in her garden; takes care of her 2-yr old grandchildren.    Patient denies nocturia (nighttime urination).  Patient denies neuropathy (nerve pain). Patient denies visual changes. Patient reports self foot exams.     O:   Lab Results  Component Value Date   HGBA1C 7.4 (A) 10/10/2019   There were no vitals filed for this visit.  Lipid Panel     Component Value Date/Time   CHOL 219 (H) 10/10/2019 1119   TRIG 188 (H) 10/10/2019 1119   HDL 48 10/10/2019 1119   CHOLHDL 4.6 (H) 10/10/2019 1119   LDLCALC 137 (H) 10/10/2019 1119   Home fasting blood sugars: 102 - 130  2 hour post-meal/random blood sugars: Denies readings >150 since starting glimepiride.  Clinical Atherosclerotic Cardiovascular Disease (ASCVD): No  The 10-year ASCVD risk score Mikey Bussing DC Jr., et al., 2013) is: 11.1%   Values used to calculate the score:     Age: 64 years     Sex: Female     Is Non-Hispanic African American: No     Diabetic: Yes     Tobacco smoker: No     Systolic Blood Pressure: [id number] mmHg     Is BP treated: Yes     HDL Cholesterol: 48 mg/dL     Total  Cholesterol: 219 mg/dL    A/P: Diabetes longstanding currently uncontrolled with A1c > 7. Patient is able to verbalize appropriate hypoglycemia management plan. Patient is adherent with medication. Recommended she continue with 34 units of Lantus and decrease further if hypoglycemia occurs again.  -Extensively discussed pathophysiology of diabetes, recommended lifestyle interventions, dietary effects on blood sugar control -Counseled on s/sx of and management of hypoglycemia -Next A1C anticipated 12/2019.   Written patient instructions provided.  Total time in face to face counseling 30 minutes.   Follow up Pharmacist  Clinic Visit in 1 month.     Benard Halsted, PharmD, [place] [phone]

## 2019-11-12 LAB — BASIC METABOLIC PANEL
BUN/Creatinine Ratio: 37 — ABNORMAL HIGH (ref 12–28)
BUN: 67 mg/dL — ABNORMAL HIGH (ref 8–27)
CO2: 17 mmol/L — ABNORMAL LOW (ref 20–29)
Calcium: 9.7 mg/dL (ref 8.7–10.3)
Chloride: 107 mmol/L — ABNORMAL HIGH (ref 96–106)
Creatinine, Ser: 1.83 mg/dL — ABNORMAL HIGH (ref 0.57–1.00)
GFR calc Af Amer: 34 mL/min/{1.73_m2} — ABNORMAL LOW (ref >59)
GFR calc non Af Amer: 29 mL/min/{1.73_m2} — ABNORMAL LOW (ref >59)
Glucose: 128 mg/dL — ABNORMAL HIGH (ref 65–99)
Potassium: 5 mmol/L (ref 3.5–5.2)
Sodium: 140 mmol/L (ref 134–144)

## 2019-11-14 ENCOUNTER — Other Ambulatory Visit: Payer: Self-pay | Admitting: Nurse Practitioner

## 2019-11-14 ENCOUNTER — Encounter: Payer: Self-pay | Admitting: Nurse Practitioner

## 2019-11-14 DIAGNOSIS — R7989 Other specified abnormal findings of blood chemistry: Secondary | ICD-10-CM

## 2019-12-07 MED FILL — LOVASTATIN 40 MG TABS: 40 | 30 days supply | Qty: 30 | Fill #6

## 2019-12-07 MED FILL — DULoxetine HCL 30 MG CPEP: 30 | 30 days supply | Qty: 60 | Fill #2

## 2019-12-07 MED FILL — ?ALLOPURINOL 100MG TABLET: 100 | 30 days supply | Qty: 60 | Fill #5

## 2019-12-07 MED FILL — LISINOPRIL 5 MG TABLET: 5 | 30 days supply | Qty: 30 | Fill #7

## 2019-12-07 MED FILL — ?BASAGLAR 100 UNITS/ML KWPE: 100 | 30 days supply | Qty: 12 | Fill #1

## 2019-12-07 MED FILL — ?FUROSEMIDE 20MG TABLET: 20 | 30 days supply | Qty: 15 | Fill #1

## 2019-12-07 MED FILL — GLIMEPIRIDE 2 MG TABS: 2 | 30 days supply | Qty: 30 | Fill #2

## 2019-12-12 ENCOUNTER — Ambulatory Visit: Payer: Medicaid Other | Admitting: Pharmacist

## 2020-01-09 ENCOUNTER — Other Ambulatory Visit: Payer: Self-pay | Admitting: Nurse Practitioner

## 2020-01-09 DIAGNOSIS — G894 Chronic pain syndrome: Secondary | ICD-10-CM

## 2020-01-09 MED FILL — ?ALLOPURINOL 100MG TABLET: 100 | 30 days supply | Qty: 60 | Fill #6

## 2020-01-09 MED FILL — ?BASAGLAR 100 UNITS/ML KWPE: 100 | 30 days supply | Qty: 12 | Fill #2

## 2020-01-09 MED FILL — LOVASTATIN 40 MG TABS: 40 | 30 days supply | Qty: 30 | Fill #7

## 2020-01-09 MED FILL — DULoxetine HCL 30 MG CPEP: 30 | 30 days supply | Qty: 60 | Fill #0

## 2020-01-09 MED FILL — ?ERGOCALCIFEROL 50000 UNITC: 1.25 MG | 84 days supply | Qty: 12 | Fill #1

## 2020-01-09 MED FILL — GLIMEPIRIDE 2 MG TABS: 2 | 30 days supply | Qty: 30 | Fill #3

## 2020-02-02 ENCOUNTER — Other Ambulatory Visit: Payer: Self-pay | Admitting: Nurse Practitioner

## 2020-02-02 MED FILL — FUROSEMIDE 20 MG TABS: 20 | 30 days supply | Qty: 15 | Fill #0

## 2020-02-02 NOTE — Telephone Encounter (Signed)
Requested medication (s) are due for refill today: yes  Requested medication (s) are on the active medication list: yes  Last refill: 10/10/19  Future visit scheduled: No  Notes to clinic:  Cr is out of range. Patent was to return for recheck of CMP14 EGFR  but has not. Just FYI    Requested Prescriptions  Pending Prescriptions Disp Refills   furosemide (LASIX) 20 MG tablet [Pharmacy Med Name: FUROSEMIDE 20MG TABLET 20 Tablet] 15 tablet 1    Sig: Take 1 tablet (20 mg total) by mouth every other day.      Cardiovascular:  Diuretics - Loop Failed - 02/02/2020  2:27 PM      Failed - Cr in normal range and within 360 days    Creatinine, Ser  Date Value Ref Range Status  11/11/2019 1.83 (H) 0.57 - 1.00 mg/dL Final          Passed - K in normal range and within 360 days    Potassium  Date Value Ref Range Status  11/11/2019 5.0 3.5 - 5.2 mmol/L Final          Passed - Ca in normal range and within 360 days    Calcium  Date Value Ref Range Status  11/11/2019 9.7 8.7 - 10.3 mg/dL Final          Passed - Na in normal range and within 360 days    Sodium  Date Value Ref Range Status  11/11/2019 140 134 - 144 mmol/L Final          Passed - Last BP in normal range    BP Readings from Last 1 Encounters:  10/10/19 124/75          Passed - Valid encounter within last 6 months    Recent Outpatient Visits           2 months ago CKD stage 3 secondary to diabetes [facility])   [place], Annie Main L, RPH-CPP   3 months ago Controlled type 2 diabetes mellitus with hyperglycemia, with long-term current use of insulin [facility])   [place], [place] W, NP   6 months ago Controlled type 2 diabetes mellitus without complication, with long-term current use of insulin [facility])   [place], [place] W, NP   9 months ago Moderate episode of recurrent major depressive disorder [facility])    [place], Vernia Buff, NP   9 months ago Essential hypertension   [place], RPH-CPP

## 2020-02-06 ENCOUNTER — Other Ambulatory Visit: Payer: Self-pay | Admitting: Nurse Practitioner

## 2020-02-06 DIAGNOSIS — Z794 Long term (current) use of insulin: Secondary | ICD-10-CM

## 2020-02-06 MED FILL — TRUEPLUS 5-BEVEL PEN NEEDLE: 31G X 5 MM | 90 days supply | Qty: 100 | Fill #0

## 2020-02-06 MED FILL — LOVASTATIN 40 MG TABS: 40 | 30 days supply | Qty: 30 | Fill #8

## 2020-02-06 MED FILL — ?ALLOPURINOL 100MG TABLET: 100 | 30 days supply | Qty: 60 | Fill #7

## 2020-02-06 MED FILL — DULoxetine HCL 30 MG CPEP: 30 | 30 days supply | Qty: 60 | Fill #1

## 2020-02-06 MED FILL — ?GLIMEPIRIDE 1MG TABS: 1 | 60 days supply | Qty: 120 | Fill #0

## 2020-03-01 MED FILL — ?BASAGLAR 100 UNITS/ML KWPE: 100 | 30 days supply | Qty: 12 | Fill #3

## 2020-03-08 MED FILL — DULoxetine HCL 30 MG CPEP: 30 | 30 days supply | Qty: 60 | Fill #2

## 2020-03-08 MED FILL — ALLOPURINOL 100 MG TABLET: 100 | 30 days supply | Qty: 60 | Fill #8

## 2020-03-08 MED FILL — FUROSEMIDE 20 MG TABS: 20 | 30 days supply | Qty: 15 | Fill #1

## 2020-03-08 MED FILL — LISINOPRIL 5 MG TABLET: 5 | 30 days supply | Qty: 30 | Fill #8

## 2020-03-08 MED FILL — LOVASTATIN 40 MG TABS: 40 | 30 days supply | Qty: 30 | Fill #0

## 2020-03-20 ENCOUNTER — Ambulatory Visit: Payer: Medicaid Other | Admitting: Nurse Practitioner

## 2020-03-21 ENCOUNTER — Encounter: Payer: Self-pay | Admitting: Nurse Practitioner

## 2020-04-06 ENCOUNTER — Other Ambulatory Visit: Payer: Self-pay | Admitting: Nurse Practitioner

## 2020-04-06 ENCOUNTER — Other Ambulatory Visit: Payer: Self-pay | Admitting: Family Medicine

## 2020-04-06 DIAGNOSIS — M1A09X0 Idiopathic chronic gout, multiple sites, without tophus (tophi): Secondary | ICD-10-CM

## 2020-04-06 DIAGNOSIS — G894 Chronic pain syndrome: Secondary | ICD-10-CM

## 2020-04-06 DIAGNOSIS — I10 Essential (primary) hypertension: Secondary | ICD-10-CM

## 2020-04-06 MED FILL — ALLOPURINOL 100 MG TABLET: 100 | 30 days supply | Qty: 60 | Fill #0

## 2020-04-06 MED FILL — LANTUS SOLOSTAR 100 UNITS/M: 100 | 30 days supply | Qty: 12 | Fill #4

## 2020-04-06 MED FILL — LISINOPRIL 5 MG TABLET: 5 | 30 days supply | Qty: 30 | Fill #0

## 2020-04-06 MED FILL — LOVASTATIN 40 MG TABS: 40 | 30 days supply | Qty: 30 | Fill #1

## 2020-04-06 MED FILL — GLIMEPIRIDE 1 MG TABLET: 1 | 30 days supply | Qty: 60 | Fill #1

## 2020-04-06 MED FILL — DULoxetine HCL 30 MG CPEP: 30 | 30 days supply | Qty: 60 | Fill #0

## 2020-04-06 NOTE — Telephone Encounter (Signed)
Requested Prescriptions  Pending Prescriptions Disp Refills  . ergocalciferol (VITAMIN D2) 1.25 MG (50000 UT) capsule [Pharmacy Med Name: ERGOCALCIFEROL 50000 UNITC 1.25 MG Capsule] 12 capsule 1    Sig: TAKE 1 CAPSULE (50,000 UNITS TOTAL) BY MOUTH EVERY 7 (SEVEN) DAYS.     Endocrinology:  Vitamins - Vitamin D Supplementation Failed - 04/06/2020 11:33 AM      Failed - 50,000 IU strengths are not delegated      Failed - Phosphate in normal range and within 360 days    No results found for: PHOS       Failed - Vitamin D in normal range and within 360 days    Vit D, 25-Hydroxy  Date Value Ref Range Status  10/10/2019 11.4 (L) 30.0 - 100.0 ng/mL Final    Comment:    Vitamin D deficiency has been defined by the Institute of Medicine and an Endocrine Society practice guideline as a level of serum 25-OH vitamin D less than 20 ng/mL (1,2). The Endocrine Society went on to further define vitamin D insufficiency as a level between 21 and 29 ng/mL (2). 1. IOM (Institute of Medicine). 2010. Dietary reference    intakes for calcium and D. [place]: The    [employer]. 2. Holick MF, Binkley [place], Bischoff-Ferrari HA, et al.    Evaluation, treatment, and prevention of vitamin D    deficiency: an Endocrine Society clinical practice    guideline. JCEM. 2011 Jul; 96(7):1911-30.          Passed - Ca in normal range and within 360 days    Calcium  Date Value Ref Range Status  11/11/2019 9.7 8.7 - 10.3 mg/dL Final         Passed - Valid encounter within last 12 months    Recent Outpatient Visits          4 months ago CKD stage 3 secondary to diabetes [facility])   [place], Annie Main L, RPH-CPP   5 months ago Controlled type 2 diabetes mellitus with hyperglycemia, with long-term current use of insulin [facility])   [place], [place] W, NP   9 months ago Controlled type 2 diabetes mellitus without complication,  with long-term current use of insulin [facility])   [place], [place] W, NP   11 months ago Moderate episode of recurrent major depressive disorder [facility])   [place], Vernia Buff, NP   11 months ago Essential hypertension   [place], RPH-CPP      Future Appointments            In 2 weeks Gildardo Pounds, NP [place]           . allopurinol (ZYLOPRIM) 100 MG tablet [Pharmacy Med Name: ALLOPURINOL 100 MG TABLET 100 Tablet] 60 tablet 2    Sig: TAKE 2 TABLETS (200 MG TOTAL) BY MOUTH DAILY.     Endocrinology:  Gout Agents Failed - 04/06/2020 11:33 AM      Failed - Uric Acid in normal range and within 360 days    Uric Acid  Date Value Ref Range Status  04/12/2019 9.3 (H) 2.5 - 7.1 mg/dL Final    Comment:               Therapeutic target for gout patients: <6.0  Failed - Cr in normal range and within 360 days    Creatinine, Ser  Date Value Ref Range Status  11/11/2019 1.83 (H) 0.57 - 1.00 mg/dL Final         Passed - Valid encounter within last 12 months    Recent Outpatient Visits          4 months ago CKD stage 3 secondary to diabetes [facility])   [place], Annie Main L, RPH-CPP   5 months ago Controlled type 2 diabetes mellitus with hyperglycemia, with long-term current use of insulin [facility])   [place], [place] W, NP   9 months ago Controlled type 2 diabetes mellitus without complication, with long-term current use of insulin [facility])   [place], [place] W, NP   11 months ago Moderate episode of recurrent major depressive disorder [facility])   [place], Vernia Buff, NP   11 months ago Essential hypertension   [place], Jarome Matin, RPH-CPP      Future Appointments            In 2 weeks Gildardo Pounds, NP [place]

## 2020-04-06 NOTE — Telephone Encounter (Signed)
Requested medication (s) are due for refill today: yes  Requested medication (s) are on the active medication list:yes  Last refill: 01/09/20  Future visit scheduled: yes, 04/25/20  Notes to clinic:  not delegated    Requested Prescriptions  Pending Prescriptions Disp Refills   ergocalciferol (VITAMIN D2) 1.25 MG (50000 UT) capsule [Pharmacy Med Name: ERGOCALCIFEROL 50000 UNITC 1.25 MG Capsule] 12 capsule 1    Sig: TAKE 1 CAPSULE (50,000 UNITS TOTAL) BY MOUTH EVERY 7 (SEVEN) DAYS.      Endocrinology:  Vitamins - Vitamin D Supplementation Failed - 04/06/2020 11:33 AM      Failed - 50,000 IU strengths are not delegated      Failed - Phosphate in normal range and within 360 days    No results found for: PHOS        Failed - Vitamin D in normal range and within 360 days    Vit D, 25-Hydroxy  Date Value Ref Range Status  10/10/2019 11.4 (L) 30.0 - 100.0 ng/mL Final    Comment:    Vitamin D deficiency has been defined by the Institute of Medicine and an Endocrine Society practice guideline as a level of serum 25-OH vitamin D less than 20 ng/mL (1,2). The Endocrine Society went on to further define vitamin D insufficiency as a level between 21 and 29 ng/mL (2). 1. IOM (Institute of Medicine). 2010. Dietary reference    intakes for calcium and D. [place]: The    [employer]. 2. Holick MF, Binkley [place], Bischoff-Ferrari HA, et al.    Evaluation, treatment, and prevention of vitamin D    deficiency: an Endocrine Society clinical practice    guideline. JCEM. 2011 Jul; 96(7):1911-30.           Passed - Ca in normal range and within 360 days    Calcium  Date Value Ref Range Status  11/11/2019 9.7 8.7 - 10.3 mg/dL Final          Passed - Valid encounter within last 12 months    Recent Outpatient Visits           4 months ago CKD stage 3 secondary to diabetes [facility])   [place], Annie Main L, RPH-CPP   5 months ago  Controlled type 2 diabetes mellitus with hyperglycemia, with long-term current use of insulin [facility])   [place], [place] W, NP   9 months ago Controlled type 2 diabetes mellitus without complication, with long-term current use of insulin [facility])   [place], [place] W, NP   11 months ago Moderate episode of recurrent major depressive disorder [facility])   [place], Vernia Buff, NP   11 months ago Essential hypertension   [place], RPH-CPP       Future Appointments             In 2 weeks Gildardo Pounds, NP [place]             Signed Prescriptions Disp Refills   allopurinol (ZYLOPRIM) 100 MG tablet 60 tablet 2    Sig: TAKE 2 TABLETS (200 MG TOTAL) BY MOUTH DAILY.      Endocrinology:  Gout Agents Failed - 04/06/2020 11:33 AM      Failed - Uric Acid in normal range and within 360 days  Uric Acid  Date Value Ref Range Status  04/12/2019 9.3 (H) 2.5 - 7.1 mg/dL Final    Comment:               Therapeutic target for gout patients: <6.0          Failed - Cr in normal range and within 360 days    Creatinine, Ser  Date Value Ref Range Status  11/11/2019 1.83 (H) 0.57 - 1.00 mg/dL Final          Passed - Valid encounter within last 12 months    Recent Outpatient Visits           4 months ago CKD stage 3 secondary to diabetes [facility])   [place], Annie Main L, RPH-CPP   5 months ago Controlled type 2 diabetes mellitus with hyperglycemia, with long-term current use of insulin [facility])   [place], [place] W, NP   9 months ago Controlled type 2 diabetes mellitus without complication, with long-term current use of insulin [facility])   [place], [place] W, NP   11 months ago  Moderate episode of recurrent major depressive disorder [facility])   [place], Vernia Buff, NP   11 months ago Essential hypertension   [place], Jarome Matin, RPH-CPP       Future Appointments             In 2 weeks Gildardo Pounds, NP [place]

## 2020-04-09 ENCOUNTER — Other Ambulatory Visit: Payer: Self-pay | Admitting: Nurse Practitioner

## 2020-04-09 MED FILL — FUROSEMIDE 20 MG TABS: 20 | 30 days supply | Qty: 15 | Fill #0

## 2020-04-25 ENCOUNTER — Encounter: Payer: Self-pay | Admitting: Nurse Practitioner

## 2020-04-25 ENCOUNTER — Other Ambulatory Visit: Payer: Self-pay | Admitting: Nurse Practitioner

## 2020-04-25 ENCOUNTER — Other Ambulatory Visit: Payer: Self-pay

## 2020-04-25 ENCOUNTER — Ambulatory Visit: Payer: 59 | Attending: Nurse Practitioner | Admitting: Nurse Practitioner

## 2020-04-25 ENCOUNTER — Ambulatory Visit: Payer: Medicaid Other | Attending: Nurse Practitioner | Admitting: Pharmacist

## 2020-04-25 VITALS — BP 111/68 | HR 83 | Temp 97.7°F | Ht 62.0 in | Wt 245.0 lb

## 2020-04-25 DIAGNOSIS — Z794 Long term (current) use of insulin: Secondary | ICD-10-CM

## 2020-04-25 DIAGNOSIS — G2581 Restless legs syndrome: Secondary | ICD-10-CM | POA: Insufficient documentation

## 2020-04-25 DIAGNOSIS — Z114 Encounter for screening for human immunodeficiency virus [HIV]: Secondary | ICD-10-CM

## 2020-04-25 DIAGNOSIS — I10 Essential (primary) hypertension: Secondary | ICD-10-CM | POA: Diagnosis not present

## 2020-04-25 DIAGNOSIS — Z7901 Long term (current) use of anticoagulants: Secondary | ICD-10-CM | POA: Insufficient documentation

## 2020-04-25 DIAGNOSIS — E1165 Type 2 diabetes mellitus with hyperglycemia: Secondary | ICD-10-CM

## 2020-04-25 DIAGNOSIS — E559 Vitamin D deficiency, unspecified: Secondary | ICD-10-CM

## 2020-04-25 DIAGNOSIS — Z760 Encounter for issue of repeat prescription: Secondary | ICD-10-CM | POA: Insufficient documentation

## 2020-04-25 DIAGNOSIS — Z23 Encounter for immunization: Secondary | ICD-10-CM

## 2020-04-25 DIAGNOSIS — F419 Anxiety disorder, unspecified: Secondary | ICD-10-CM | POA: Diagnosis not present

## 2020-04-25 DIAGNOSIS — Z1159 Encounter for screening for other viral diseases: Secondary | ICD-10-CM

## 2020-04-25 DIAGNOSIS — E785 Hyperlipidemia, unspecified: Secondary | ICD-10-CM | POA: Diagnosis not present

## 2020-04-25 DIAGNOSIS — F32A Depression, unspecified: Secondary | ICD-10-CM

## 2020-04-25 DIAGNOSIS — R450 Nervousness: Secondary | ICD-10-CM | POA: Insufficient documentation

## 2020-04-25 DIAGNOSIS — Z79899 Other long term (current) drug therapy: Secondary | ICD-10-CM | POA: Insufficient documentation

## 2020-04-25 DIAGNOSIS — F329 Major depressive disorder, single episode, unspecified: Secondary | ICD-10-CM | POA: Insufficient documentation

## 2020-04-25 LAB — POCT GLYCOSYLATED HEMOGLOBIN (HGB A1C): Hemoglobin A1C: 6 % — AB (ref 4.0–5.6)

## 2020-04-25 LAB — GLUCOSE, POCT (MANUAL RESULT ENTRY): POC Glucose: 116 mg/dl — AB (ref 70–99)

## 2020-04-25 MED ORDER — LOVASTATIN 40 MG PO TABS: 40.0000 mg | ORAL_TABLET | Freq: Every day | ORAL | 2 refills | Status: DC

## 2020-04-25 MED ORDER — GLIMEPIRIDE 2 MG PO TABS: 2.0000 mg | ORAL_TABLET | Freq: Every day | ORAL | 1 refills | Status: DC

## 2020-04-25 MED ORDER — INSULIN GLARGINE 100 UNIT/ML SOLOSTAR PEN: 30.0000 [IU] | PEN_INJECTOR | Freq: Every day | SUBCUTANEOUS | 11 refills | Status: DC

## 2020-04-25 MED ORDER — CITALOPRAM HYDROBROMIDE 20 MG PO TABS: 20.0000 mg | ORAL_TABLET | Freq: Every day | ORAL | 0 refills | Status: DC

## 2020-04-25 MED FILL — GLIMEPIRIDE 2 MG TABS: 2 | 90 days supply | Qty: 90 | Fill #0

## 2020-04-25 MED FILL — CITALOPRAM HBR 20 MG TABLET: 20 | 90 days supply | Qty: 90 | Fill #0

## 2020-04-25 NOTE — Progress Notes (Signed)
Assessment & Plan:  Kristy Flores was seen today for medication refill.  Diagnoses and all orders for this visit:  Controlled type 2 diabetes mellitus with hyperglycemia, with long-term current use of insulin (HCC) -     Glucose (CBG) -     HgB A1c -     Microalbumin/Creatinine Ratio, Urine -     glimepiride (AMARYL) 2 MG tablet; Take 1 tablet (2 mg total) by mouth daily before breakfast. -     CMP14+EGFR -     insulin glargine (LANTUS) 100 UNIT/ML Solostar Pen; Inject 30 Units into the skin at bedtime. Increase night time lantus by 2 units every 3 days if fasting blood glucose >130. Decrease nighttime lantus by 2 units every 3 days for Fasting blood glucose less than 90. Continue blood sugar control as discussed in office today, low carbohydrate diet, and regular physical exercise as tolerated, 150 minutes per week (30 min each day, 5 days per week, or 50 min 3 days per week). Keep blood sugar logs with fasting goal of 90-130 mg/dl, post prandial (after you eat) less than 180.  For Hypoglycemia: BS <60 and Hyperglycemia BS >400; contact the clinic ASAP. Annual eye exams and foot exams are recommended.   Essential hypertension Continue all antihypertensives as prescribed.  Remember to bring in your blood pressure log with you for your follow up appointment.  DASH/Mediterranean Diets are healthier choices for HTN.    Dyslipidemia, goal LDL below 70 -     Lipid panel -     lovastatin (MEVACOR) 40 MG tablet; Take 1 tablet (40 mg total) by mouth at bedtime. INSTRUCTIONS: Work on a low fat, heart healthy diet and participate in regular aerobic exercise program by working out at least 150 minutes per week; 5 days a week-30 minutes per day. Avoid red meat/beef/steak,  fried foods. junk foods, sodas, sugary drinks, unhealthy snacking, alcohol and smoking.  Drink at least 80 oz of water per day and monitor your carbohydrate intake daily.    Anxiety and depression -     citalopram (CELEXA) 20 MG  tablet; Take 1 tablet (20 mg total) by mouth daily.  Encounter for screening for HIV -     HIV antibody (with reflex)  Need for hepatitis C screening test -     Hepatitis C Antibody  Vitamin D deficiency disease -     VITAMIN D 25 Hydroxy (Vit-D Deficiency, Fractures)    Patient has been counseled on age-appropriate routine health concerns for screening and prevention. These are reviewed and up-to-date. Referrals have been placed accordingly. Immunizations are up-to-date or declined.    Subjective:   Chief Complaint  Patient presents with   Medication Refill   HPI Kristy Flores 64 y.o. female presents to office today for follow up.  has a past medical history of Depression, Diabetes mellitus without complication ([place]), Hyperlipidemia, Hypertension, Insomnia, and Restless leg syndrome.  Declines mamm  Essential Hypertension Blood pressure is well controlled with lisinopril 5 mg daily. She takes furosemide every other day for fluid retention. Denies chest pain, shortness of breath, palpitations, lightheadedness, dizziness, headaches or BLE edema.  BP Readings from Last 3 Encounters:  04/25/20 111/68  10/10/19 124/75  04/25/19 102/64   DM TYPE 2 Well controlled with glimepiride 2 mg daily  And lantus 26-30 units. Will decrease the 40 units to 30 today. LDL not at goal with lovastatin 40 mg daily. She denies any statin intolerance.  Lab Results  Component Value Date  HGBA1C 6.0 (A) 04/25/2020   Lab Results  Component Value Date   HGBA1C 7.4 (A) 10/10/2019   Lab Results  Component Value Date   LDLCALC 137 (H) 10/10/2019   Anxiety and Depression  Has tried lexapro in the past which was ineffective. Will try celexa today. Does not endorse any current thoughts of self harm or intent.  Depression screen [facility] 2/9 04/25/2020 10/10/2019 05/03/2019 04/12/2019 09/29/2018  Decreased Interest 3 1 0 3 0  Down, Depressed, Hopeless 3 3 0 3 2  PHQ - 2 Score 6 4 0 6 2  Altered sleeping _0 Tired, decreased energy 3 2 0 3 3  Change in appetite 2 2 0 2 3  Feeling bad or failure about yourself  3 3 0 2 3  Trouble concentrating 3 2 0 3 2  Moving slowly or fidgety/restless 0 2 0 3 0  Suicidal thoughts 2 3 0 2 0  PHQ-9 Score _1 GAD 7 : Generalized Anxiety Score 04/25/2020 10/10/2019 05/03/2019 04/12/2019  Nervous, Anxious, on Edge 3 3 0 3  Control/stop worrying _2 Worry too much - different things _3 Trouble relaxing 3 3 0 3  Restless 3 3 0 2  Easily annoyed or irritable _4 Afraid - awful might happen 3 3 0 2  Total GAD 7 Score _5 Review of Systems  Constitutional: Negative for fever, malaise/fatigue and weight loss.  HENT: Negative.  Negative for nosebleeds.   Eyes: Negative.  Negative for blurred vision, double vision and photophobia.  Respiratory: Negative.  Negative for cough and shortness of breath.   Cardiovascular: Negative.  Negative for chest pain, palpitations and leg swelling.  Gastrointestinal: Negative.  Negative for heartburn, nausea and vomiting.  Musculoskeletal: Negative.  Negative for myalgias.  Neurological: Negative.  Negative for dizziness, focal weakness, seizures and headaches.  Psychiatric/Behavioral: Positive for depression. Negative for suicidal ideas. The patient is nervous/anxious.     Past Medical History:  Diagnosis Date   Depression    Diabetes mellitus without complication ([place])    Hyperlipidemia    Hypertension    Insomnia    Restless leg syndrome     Past Surgical History:  Procedure Laterality Date   ABDOMINAL HYSTERECTOMY     MELANOMA EXCISION  1985    Family History  Problem Relation Age of Onset   Diabetes Mother    Cancer Father     Social History Reviewed with no changes to be made today.   Outpatient Medications Prior to Visit  Medication Sig Dispense Refill   allopurinol (ZYLOPRIM) 100 MG tablet TAKE 2 TABLETS (200 MG TOTAL) BY MOUTH DAILY. 60 tablet 2     Blood Glucose Monitoring Suppl (TRUE METRIX METER) w/Device KIT Use as instructed. Check blood glucose level by fingerstick twice per day. 1 kit 0   DULoxetine (CYMBALTA) 30 MG capsule TAKE 1 CAPSULE (30 MG TOTAL) BY MOUTH 2 (TWO) TIMES DAILY. 60 capsule 2   furosemide (LASIX) 20 MG tablet TAKE 1 TABLET (20 MG TOTAL) BY MOUTH EVERY OTHER DAY. 15 tablet 0   glucose blood (TRUE METRIX BLOOD GLUCOSE TEST) test strip Use as instructed 100 each 12   Insulin Pen Needle (B-D UF III MINI PEN NEEDLES) 31G X 5 MM MISC Use as instructed. Inject into the skin once nightly. 100 each 1   lisinopril (  ZESTRIL) 5 MG tablet TAKE 1 TABLET (5 MG TOTAL) BY MOUTH DAILY. 30 tablet 2   TRUEplus Lancets 28G MISC Use as instructed. Check blood glucose level by fingerstick twice per day. 100 each 3   Vitamin D, Ergocalciferol, (DRISDOL) 1.25 MG (50000 UNIT) CAPS capsule Take 1 capsule (50,000 Units total) by mouth every 7 (seven) days. 12 capsule 1   glimepiride (AMARYL) 2 MG tablet TAKE 1 TABLET (2 MG TOTAL) BY MOUTH DAILY BEFORE BREAKFAST. 90 tablet 0   ALPRAZolam (XANAX) 0.5 MG tablet Take 0.5 mg by mouth at bedtime as needed for anxiety. (Patient not taking: Reported on 04/25/2020)     insulin glargine (LANTUS) 100 UNIT/ML Solostar Pen Inject 40 Units into the skin at bedtime. Increase night time lantus by 2 units every 3 days if fasting blood glucose >130. Decrease nighttime lantus by 2 units every 3 days for Fasting blood glucose less than 90. 15 mL 11   lovastatin (MEVACOR) 40 MG tablet Take 1 tablet (40 mg total) by mouth at bedtime. 90 tablet 2   No facility-administered medications prior to visit.    Allergies  Allergen Reactions   Codeine     Vomit   Gabapentin     AKI   Penicillins     Broke out, stomach upset.    Victoza [Liraglutide]     creatinine       Objective:    BP 111/68 (BP Location: Left Arm, Patient Position: Sitting, Cuff Size: Large)    Pulse 83    Temp 97.7 F (36.5  C) (Temporal)    Ht _0  (1.575 m)    Wt 245 lb (111.1 kg)    SpO2 99%    BMI 44.81 kg/m  Wt Readings from Last 3 Encounters:  04/25/20 245 lb (111.1 kg)  10/10/19 255 lb (115.7 kg)  04/12/19 262 lb (118.8 kg)    Physical Exam       Patient has been counseled extensively about nutrition and exercise as well as the importance of adherence with medications and regular follow-up. The patient was given clear instructions to go to ER or return to medical center if symptoms don't improve, worsen or new problems develop. The patient verbalized understanding.   Follow-up: Return in about 3 months (around 07/26/2020).   Gildardo Pounds, FNP-BC [facility] [place], [place]   04/25/2020, 9:50 PM

## 2020-04-25 NOTE — Progress Notes (Signed)
Patient presents for vaccination against influenza per orders of Zelda. Consent given. Counseling provided. No contraindications exists. Vaccine administered without incident.  ° °Luke Van Ausdall, PharmD, CPP °Clinical Pharmacist °[facility] °[phone] ° °

## 2020-04-26 ENCOUNTER — Encounter: Payer: Self-pay | Admitting: Nurse Practitioner

## 2020-04-26 LAB — CMP14+EGFR
ALT: 17 IU/L (ref 0–32)
AST: 16 IU/L (ref 0–40)
Albumin/Globulin Ratio: 1.7 (ref 1.2–2.2)
Albumin: 4.3 g/dL (ref 3.8–4.8)
Alkaline Phosphatase: 63 IU/L (ref 44–121)
BUN/Creatinine Ratio: 39 — ABNORMAL HIGH (ref 12–28)
BUN: 60 mg/dL — ABNORMAL HIGH (ref 8–27)
Bilirubin Total: 0.2 mg/dL (ref 0.0–1.2)
CO2: 19 mmol/L — ABNORMAL LOW (ref 20–29)
Calcium: 9.8 mg/dL (ref 8.7–10.3)
Chloride: 110 mmol/L — ABNORMAL HIGH (ref 96–106)
Creatinine, Ser: 1.55 mg/dL — ABNORMAL HIGH (ref 0.57–1.00)
GFR calc Af Amer: 41 mL/min/{1.73_m2} — ABNORMAL LOW (ref >59)
GFR calc non Af Amer: 36 mL/min/{1.73_m2} — ABNORMAL LOW (ref >59)
Globulin, Total: 2.6 g/dL (ref 1.5–4.5)
Glucose: 109 mg/dL — ABNORMAL HIGH (ref 65–99)
Potassium: 5 mmol/L (ref 3.5–5.2)
Sodium: 143 mmol/L (ref 134–144)
Total Protein: 6.9 g/dL (ref 6.0–8.5)

## 2020-04-26 LAB — HIV ANTIBODY (ROUTINE TESTING W REFLEX): HIV Screen 4th Generation wRfx: NONREACTIVE

## 2020-04-26 LAB — LIPID PANEL
Chol/HDL Ratio: 4.1 ratio (ref 0.0–4.4)
Cholesterol, Total: 198 mg/dL (ref 100–199)
HDL: 48 mg/dL (ref >39)
LDL Chol Calc (NIH): 120 mg/dL — ABNORMAL HIGH (ref 0–99)
Triglycerides: 173 mg/dL — ABNORMAL HIGH (ref 0–149)
VLDL Cholesterol Cal: 30 mg/dL (ref 5–40)

## 2020-04-26 LAB — VITAMIN D 25 HYDROXY (VIT D DEFICIENCY, FRACTURES): Vit D, 25-Hydroxy: 23.2 ng/mL — ABNORMAL LOW (ref 30.0–100.0)

## 2020-04-26 LAB — MICROALBUMIN / CREATININE URINE RATIO
Creatinine, Urine: 75.9 mg/dL
Microalb/Creat Ratio: 113 mg/g creat — ABNORMAL HIGH (ref 0–29)
Microalbumin, Urine: 86 ug/mL

## 2020-04-26 LAB — HEPATITIS C ANTIBODY: Hep C Virus Ab: <0.1 s/co ratio (ref 0.0–0.9)

## 2020-04-30 ENCOUNTER — Other Ambulatory Visit: Payer: Self-pay | Admitting: Nurse Practitioner

## 2020-04-30 MED ORDER — ATORVASTATIN CALCIUM 20 MG PO TABS: 20.0000 mg | ORAL_TABLET | Freq: Every day | ORAL | 3 refills | Status: DC

## 2020-05-01 MED FILL — ATORVASTATIN CALCIUM 20 MG: 20 | 90 days supply | Qty: 90 | Fill #0

## 2020-05-08 ENCOUNTER — Other Ambulatory Visit: Payer: Self-pay | Admitting: Nurse Practitioner

## 2020-05-08 ENCOUNTER — Other Ambulatory Visit: Payer: Self-pay | Admitting: Family Medicine

## 2020-05-08 MED FILL — LISINOPRIL 5 MG TABLET: 5 | 30 days supply | Qty: 30 | Fill #1

## 2020-05-08 MED FILL — LOVASTATIN 40 MG TABS: 40 | 30 days supply | Qty: 30 | Fill #2

## 2020-05-08 MED FILL — DULoxetine HCL 30 MG CPEP: 30 | 30 days supply | Qty: 60 | Fill #1

## 2020-05-08 MED FILL — ALLOPURINOL 100 MG TABLET: 100 | 30 days supply | Qty: 60 | Fill #1

## 2020-05-08 MED FILL — FUROSEMIDE 20 MG TABS: 20 | 90 days supply | Qty: 45 | Fill #0

## 2020-05-08 NOTE — Telephone Encounter (Signed)
Requested Prescriptions  Pending Prescriptions Disp Refills  . furosemide (LASIX) 20 MG tablet [Pharmacy Med Name: FUROSEMIDE 20 MG TABS 20 Tablet] 45 tablet 0    Sig: Take 1 tablet (20 mg total) by mouth every other day.     Cardiovascular:  Diuretics - Loop Failed - 05/08/2020 10:29 AM      Failed - Cr in normal range and within 360 days    Creatinine, Ser  Date Value Ref Range Status  04/25/2020 1.55 (H) 0.57 - 1.00 mg/dL Final         Passed - K in normal range and within 360 days    Potassium  Date Value Ref Range Status  04/25/2020 5.0 3.5 - 5.2 mmol/L Final         Passed - Ca in normal range and within 360 days    Calcium  Date Value Ref Range Status  04/25/2020 9.8 8.7 - 10.3 mg/dL Final         Passed - Na in normal range and within 360 days    Sodium  Date Value Ref Range Status  04/25/2020 143 134 - 144 mmol/L Final         Passed - Last BP in normal range    BP Readings from Last 1 Encounters:  04/25/20 111/68         Passed - Valid encounter within last 6 months    Recent Outpatient Visits          1 week ago Need for influenza vaccination   [place], Jarome Matin, RPH-CPP   1 week ago Controlled type 2 diabetes mellitus with hyperglycemia, with long-term current use of insulin [facility])   [place], [place] W, NP   5 months ago CKD stage 3 secondary to diabetes [facility])   [place], Annie Main L, RPH-CPP   7 months ago Controlled type 2 diabetes mellitus with hyperglycemia, with long-term current use of insulin [facility])   [place], [place] W, NP   10 months ago Controlled type 2 diabetes mellitus without complication, with long-term current use of insulin [facility])   [place], Vernia Buff, NP      Future Appointments            In 2 months Gildardo Pounds, NP [place]

## 2020-05-25 MED FILL — LANTUS SOLOSTAR 100 UNITS/M: 100 | 30 days supply | Qty: 12 | Fill #5

## 2020-06-08 MED FILL — DULoxetine HCL 30 MG CPEP: 30 | 30 days supply | Qty: 60 | Fill #2

## 2020-06-08 MED FILL — LOVASTATIN 40 MG TABS: 40 | 30 days supply | Qty: 30 | Fill #3

## 2020-06-08 MED FILL — ALLOPURINOL 100 MG TABLET: 100 | 30 days supply | Qty: 60 | Fill #2

## 2020-06-08 MED FILL — LISINOPRIL 5 MG TABLET: 5 | 30 days supply | Qty: 30 | Fill #2

## 2020-06-29 MED FILL — BD PEN NDL MINI 31GX5MM: 31G X 5 MM | 90 days supply | Qty: 100 | Fill #1

## 2020-06-29 MED FILL — LANTUS SOLOSTAR 100 UNITS/M: 100 | 30 days supply | Qty: 12 | Fill #6

## 2020-07-09 ENCOUNTER — Other Ambulatory Visit: Payer: Self-pay | Admitting: Nurse Practitioner

## 2020-07-09 ENCOUNTER — Other Ambulatory Visit: Payer: Self-pay | Admitting: Family Medicine

## 2020-07-09 DIAGNOSIS — G894 Chronic pain syndrome: Secondary | ICD-10-CM

## 2020-07-09 MED FILL — LISINOPRIL 5 MG TABLET: 5 | 30 days supply | Qty: 30 | Fill #0

## 2020-07-09 MED FILL — LOVASTATIN 40 MG TABS: 40 | 30 days supply | Qty: 30 | Fill #4

## 2020-07-09 MED FILL — DULoxetine HCL 30 MG CPEP: 30 | 30 days supply | Qty: 60 | Fill #0

## 2020-07-09 MED FILL — ALLOPURINOL 100 MG TABLET: 100 | 30 days supply | Qty: 60 | Fill #0

## 2020-07-24 ENCOUNTER — Ambulatory Visit: Payer: Self-pay | Attending: Nurse Practitioner | Admitting: Nurse Practitioner

## 2020-07-24 ENCOUNTER — Other Ambulatory Visit: Payer: Self-pay

## 2020-07-25 MED FILL — GLIMEPIRIDE 2 MG TABS: 2 | 30 days supply | Qty: 30 | Fill #1

## 2020-08-08 MED FILL — !LANTUS SOLOSTAR 100UNITS/M: 100 | 30 days supply | Qty: 12 | Fill #7

## 2020-08-08 MED FILL — LISINOPRIL 5 MG TABLET: 5 | 30 days supply | Qty: 30 | Fill #1

## 2020-08-08 MED FILL — ALLOPURINOL 100 MG TABLET: 100 | 30 days supply | Qty: 60 | Fill #1

## 2020-08-08 MED FILL — LOVASTATIN 40 MG TABS: 40 | 30 days supply | Qty: 30 | Fill #5

## 2020-08-08 MED FILL — DULoxetine HCL 30 MG CPEP: 30 | 30 days supply | Qty: 60 | Fill #1

## 2020-08-27 ENCOUNTER — Other Ambulatory Visit: Payer: Self-pay | Admitting: Nurse Practitioner

## 2020-08-27 MED FILL — GLIMEPIRIDE 2 MG TABLET: 2 | 30 days supply | Qty: 30 | Fill #0

## 2020-08-27 MED FILL — FUROSEMIDE 20 MG TABS: 20 | 30 days supply | Qty: 15 | Fill #0

## 2020-09-05 ENCOUNTER — Ambulatory Visit: Payer: Medicaid Other | Attending: Nurse Practitioner | Admitting: Nurse Practitioner

## 2020-09-05 ENCOUNTER — Other Ambulatory Visit: Payer: Self-pay

## 2020-09-05 ENCOUNTER — Encounter: Payer: Self-pay | Admitting: Nurse Practitioner

## 2020-09-05 ENCOUNTER — Other Ambulatory Visit: Payer: Self-pay | Admitting: Nurse Practitioner

## 2020-09-05 DIAGNOSIS — M1A09X0 Idiopathic chronic gout, multiple sites, without tophus (tophi): Secondary | ICD-10-CM | POA: Diagnosis not present

## 2020-09-05 DIAGNOSIS — Z794 Long term (current) use of insulin: Secondary | ICD-10-CM | POA: Diagnosis not present

## 2020-09-05 DIAGNOSIS — E1165 Type 2 diabetes mellitus with hyperglycemia: Secondary | ICD-10-CM | POA: Diagnosis not present

## 2020-09-05 DIAGNOSIS — E785 Hyperlipidemia, unspecified: Secondary | ICD-10-CM

## 2020-09-05 MED ORDER — LOVASTATIN 40 MG PO TABS: 40.0000 mg | ORAL_TABLET | Freq: Every day | ORAL | 2 refills | Status: DC

## 2020-09-05 MED ORDER — ALLOPURINOL 100 MG PO TABS: 200.0000 mg | ORAL_TABLET | Freq: Every day | ORAL | 1 refills | Status: DC

## 2020-09-05 MED FILL — ALLOPURINOL 100 MG TABS: 100 | 90 days supply | Qty: 180 | Fill #0

## 2020-09-05 MED FILL — LOVASTATIN 40 MG TABS: 40 | 90 days supply | Qty: 90 | Fill #0

## 2020-09-05 NOTE — Progress Notes (Signed)
Virtual Visit via Telephone Note Due to national recommendations of social distancing due to [place] 19, telehealth visit is felt to be most appropriate for this patient at this time.  I discussed the limitations, risks, security and privacy concerns of performing an evaluation and management service by telephone and the availability of in person appointments. I also discussed with the patient that there may be a patient responsible charge related to this service. The patient expressed understanding and agreed to proceed.    I connected with Kristy Flores on 09/05/20  at  11:10 AM EST  EDT by telephone and verified that I am speaking with the correct person using two identifiers.   Consent I discussed the limitations, risks, security and privacy concerns of performing an evaluation and management service by telephone and the availability of in person appointments. I also discussed with the patient that there may be a patient responsible charge related to this service. The patient expressed understanding and agreed to proceed.   Location of Patient: Private residence   Location of Provider: [place] and [place] participating in Telemedicine visit: Geryl Rankins FNP-BC [place]    History of Present Illness: Telemedicine visit for: Follow-up She has a past medical history of Depression, DM2, Hyperlipidemia, Hypertension, Insomnia, and Restless leg syndrome.  Patient has been counseled on age-appropriate routine health concerns for screening and prevention. These are reviewed and up-to-date. Referrals have been placed accordingly. Immunizations are up-to-date or declined.    She continues to decline mammogram  DM2 Well-controlled and she reports average home blood glucose readings 120s.  She is currently taking glimepiride 2 mg daily and Lantus 30 units at bedtime.  She has been prescribed renal dose ACE and statin.  LDL is not at goal Lab  Results  Component Value Date   HGBA1C 6.0 (A) 04/25/2020   Lab Results  Component Value Date   LDLCALC 120 (H) 04/25/2020   Depression PHQ-9 score has improved.  Patient states Celexa 20 mg was ineffective.  We will continue her on Cymbalta 30 mg twice daily. Depression screen [facility] 2/9 09/05/2020 04/25/2020 10/10/2019 05/03/2019 04/12/2019  Decreased Interest 0 3 1 0 3  Down, Depressed, Hopeless 2 3 3  0 3  PHQ - 2 Score 2 6 4  0 6  Altered sleeping 2 3 3 3 3   Tired, decreased energy 0 3 2 0 3  Change in appetite 0 2 2 0 2  Feeling bad or failure about yourself  0 3 3 0 2  Trouble concentrating 2 3 2  0 3  Moving slowly or fidgety/restless 0 0 2 0 3  Suicidal thoughts 0 2 3 0 2  PHQ-9 Score 6 22 21 3 24    GAD 7 : Generalized Anxiety Score 09/05/2020 04/25/2020 10/10/2019 05/03/2019  Nervous, Anxious, on Edge 2 3 3  0  Control/stop worrying 1 3 3 3   Worry too much - different things 1 3 3 3   Trouble relaxing 1 3 3  0  Restless 0 3 3 0  Easily annoyed or irritable 2 3 3 1   Afraid - awful might happen 0 3 3 0  Total GAD 7 Score 7 21 21 7       Past Medical History:  Diagnosis Date  . Depression   . Diabetes mellitus without complication ([place])   . Hyperlipidemia   . Hypertension   . Insomnia   . Restless leg syndrome     Past Surgical History:  Procedure  Laterality Date  . ABDOMINAL HYSTERECTOMY    . MELANOMA EXCISION  1985    Family History  Problem Relation Age of Onset  . Diabetes Mother   . Cancer Father     Social History   Socioeconomic History  . Marital status: Divorced    Spouse name: Not on file  . Number of children: Not on file  . Years of education: Not on file  . Highest education level: Not on file  Occupational History  . Not on file  Tobacco Use  . Smoking status: Never Smoker  . Smokeless tobacco: Never Used  Vaping Use  . Vaping Use: Never used  Substance and Sexual Activity  . Alcohol use: Not Currently  . Drug use: Not Currently  . Sexual  activity: Not Currently  Other Topics Concern  . Not on file  Social History Narrative  . Not on file   Social Determinants of Health   Financial Resource Strain: Not on file  Food Insecurity: Not on file  Transportation Needs: Not on file  Physical Activity: Not on file  Stress: Not on file  Social Connections: Not on file     Observations/Objective: Awake, alert and oriented x 3   Review of Systems  Constitutional: Negative for fever, malaise/fatigue and weight loss.  HENT: Negative.  Negative for nosebleeds.   Eyes: Negative.  Negative for blurred vision, double vision and photophobia.  Respiratory: Negative.  Negative for cough and shortness of breath.   Cardiovascular: Negative.  Negative for chest pain, palpitations and leg swelling.  Gastrointestinal: Negative.  Negative for heartburn, nausea and vomiting.  Musculoskeletal: Negative.  Negative for myalgias.  Neurological: Negative.  Negative for dizziness, focal weakness, seizures and headaches.  Psychiatric/Behavioral: Negative.  Negative for suicidal ideas.    Assessment and Plan: Erikka was seen today for follow-up.  Diagnoses and all orders for this visit:  Controlled type 2 diabetes mellitus with hyperglycemia, with long-term current use of insulin ([place]) Continue blood sugar control as discussed in office today, low carbohydrate diet, and regular physical exercise as tolerated, 150 minutes per week (30 min each day, 5 days per week, or 50 min 3 days per week). Keep blood sugar logs with fasting goal of 90-130 mg/dl, post prandial (after you eat) less than 180.  For Hypoglycemia: BS <60 and Hyperglycemia BS >400; contact the clinic ASAP. Annual eye exams and foot exams are recommended.   Dyslipidemia, goal LDL below 70. -     lovastatin (MEVACOR) 40 MG tablet; Take 1 tablet (40 mg total) by mouth at bedtime. INSTRUCTIONS: Work on a low fat, heart healthy diet and participate in regular aerobic exercise program by  working out at least 150 minutes per week; 5 days a week-30 minutes per day. Avoid red meat/beef/steak,  fried foods. junk foods, sodas, sugary drinks, unhealthy snacking, alcohol and smoking.  Drink at least 80 oz of water per day and monitor your carbohydrate intake daily.   Chronic gout of multiple sites, unspecified cause -     Discontinue: allopurinol (ZYLOPRIM) 100 MG tablet; Take 2 tablets (200 mg total) by mouth daily. -     allopurinol (ZYLOPRIM) 100 MG tablet; Take 2 tablets (200 mg total) by mouth daily.     Follow Up Instructions Return in about 3 months (around 12/03/2020).     I discussed the assessment and treatment plan with the patient. The patient was provided an opportunity to ask questions and all were answered. The patient agreed with  the plan and demonstrated an understanding of the instructions.   The patient was advised to call back or seek an in-person evaluation if the symptoms worsen or if the condition fails to improve as anticipated.  I provided 12 minutes of non-face-to-face time during this encounter including median intraservice time, reviewing previous notes, labs, imaging, medications and explaining diagnosis and management.  Gildardo Pounds, FNP-BC

## 2020-09-07 ENCOUNTER — Other Ambulatory Visit: Payer: Self-pay | Admitting: Nurse Practitioner

## 2020-09-07 ENCOUNTER — Other Ambulatory Visit (HOSPITAL_COMMUNITY): Payer: Self-pay | Admitting: Nurse Practitioner

## 2020-09-07 DIAGNOSIS — E1165 Type 2 diabetes mellitus with hyperglycemia: Secondary | ICD-10-CM

## 2020-09-07 MED FILL — DULoxetine HCL 30 MG CPEP: 30 | 30 days supply | Qty: 60 | Fill #0

## 2020-09-07 MED FILL — LISINOPRIL 5 MG TABS: 5 | 30 days supply | Qty: 30 | Fill #0

## 2020-09-07 MED FILL — BD PEN NDL SHORT 31GX5/16: 31G X 8 MM | 100 days supply | Qty: 100 | Fill #0

## 2020-09-12 ENCOUNTER — Other Ambulatory Visit: Payer: Self-pay

## 2020-09-12 ENCOUNTER — Ambulatory Visit: Payer: 59 | Attending: Nurse Practitioner

## 2020-09-12 ENCOUNTER — Other Ambulatory Visit: Payer: Self-pay | Admitting: Nurse Practitioner

## 2020-09-12 DIAGNOSIS — Z1231 Encounter for screening mammogram for malignant neoplasm of breast: Secondary | ICD-10-CM

## 2020-09-12 DIAGNOSIS — E785 Hyperlipidemia, unspecified: Secondary | ICD-10-CM

## 2020-09-12 DIAGNOSIS — E1165 Type 2 diabetes mellitus with hyperglycemia: Secondary | ICD-10-CM

## 2020-09-12 DIAGNOSIS — Z794 Long term (current) use of insulin: Secondary | ICD-10-CM

## 2020-09-13 LAB — BASIC METABOLIC PANEL
BUN/Creatinine Ratio: 40 — ABNORMAL HIGH (ref 12–28)
BUN: 64 mg/dL — ABNORMAL HIGH (ref 8–27)
CO2: 17 mmol/L — ABNORMAL LOW (ref 20–29)
Calcium: 9.8 mg/dL (ref 8.7–10.3)
Chloride: 104 mmol/L (ref 96–106)
Creatinine, Ser: 1.61 mg/dL — ABNORMAL HIGH (ref 0.57–1.00)
GFR calc Af Amer: 39 mL/min/{1.73_m2} — ABNORMAL LOW (ref >59)
GFR calc non Af Amer: 34 mL/min/{1.73_m2} — ABNORMAL LOW (ref >59)
Glucose: 119 mg/dL — ABNORMAL HIGH (ref 65–99)
Potassium: 5 mmol/L (ref 3.5–5.2)
Sodium: 138 mmol/L (ref 134–144)

## 2020-09-13 LAB — LIPID PANEL
Chol/HDL Ratio: 4.7 ratio — ABNORMAL HIGH (ref 0.0–4.4)
Cholesterol, Total: 207 mg/dL — ABNORMAL HIGH (ref 100–199)
HDL: 44 mg/dL (ref >39)
LDL Chol Calc (NIH): 117 mg/dL — ABNORMAL HIGH (ref 0–99)
Triglycerides: 263 mg/dL — ABNORMAL HIGH (ref 0–149)
VLDL Cholesterol Cal: 46 mg/dL — ABNORMAL HIGH (ref 5–40)

## 2020-09-13 LAB — HEMOGLOBIN A1C
Est. average glucose Bld gHb Est-mCnc: 131 mg/dL
Hgb A1c MFr Bld: 6.2 % — ABNORMAL HIGH (ref 4.8–5.6)

## 2020-09-26 MED FILL — GLIMEPIRIDE 2 MG TABLET: 2 | 30 days supply | Qty: 30 | Fill #1

## 2020-09-26 MED FILL — LANTUS SOLOSTAR 100 UNITS/M: 100 | 30 days supply | Qty: 15 | Fill #0

## 2020-09-26 MED FILL — FUROSEMIDE 20 MG TABS: 20 | 30 days supply | Qty: 15 | Fill #1

## 2020-10-09 ENCOUNTER — Other Ambulatory Visit: Payer: Self-pay | Admitting: Nurse Practitioner

## 2020-10-09 DIAGNOSIS — I10 Essential (primary) hypertension: Secondary | ICD-10-CM

## 2020-10-09 DIAGNOSIS — G894 Chronic pain syndrome: Secondary | ICD-10-CM

## 2020-10-09 MED FILL — DULoxetine HCL 30 MG CPEP: 30 | 30 days supply | Qty: 60 | Fill #0

## 2020-10-09 MED FILL — LISINOPRIL 5 MG TABS: 5 | 30 days supply | Qty: 30 | Fill #0

## 2020-10-09 NOTE — Telephone Encounter (Signed)
Requested Prescriptions  Pending Prescriptions Disp Refills  . DULoxetine (CYMBALTA) 30 MG capsule [Pharmacy Med Name: DULoxetine HCL 30 MG CPEP 30 Capsule] 60 capsule 0    Sig: TAKE 1 CAPSULE (30 MG TOTAL) BY MOUTH 2 (TWO) TIMES DAILY.     Psychiatry: Antidepressants - SNRI Passed - 10/09/2020 12:24 PM      Passed - Last BP in normal range    BP Readings from Last 1 Encounters:  04/25/20 111/68         Passed - Valid encounter within last 6 months    Recent Outpatient Visits          1 month ago Controlled type 2 diabetes mellitus with hyperglycemia, with long-term current use of insulin [facility])   [place], [place] W, NP   5 months ago Need for influenza vaccination   [place], Annie Main L, RPH-CPP   5 months ago Controlled type 2 diabetes mellitus with hyperglycemia, with long-term current use of insulin [facility])   [place], [place] W, NP   11 months ago CKD stage 3 secondary to diabetes [facility])   [place], RPH-CPP   1 year ago Controlled type 2 diabetes mellitus with hyperglycemia, with long-term current use of insulin [facility])   [place], Vernia Buff, NP      Future Appointments            In 2 weeks Gildardo Pounds, NP [place]           . lisinopril (ZESTRIL) 5 MG tablet [Pharmacy Med Name: LISINOPRIL 5 MG TABS 5 Tablet] 30 tablet 2    Sig: TAKE 1 TABLET (5 MG TOTAL) BY MOUTH DAILY.     Cardiovascular:  ACE Inhibitors Failed - 10/09/2020 12:24 PM      Failed - Cr in normal range and within 180 days    Creatinine, Ser  Date Value Ref Range Status  09/12/2020 1.61 (H) 0.57 - 1.00 mg/dL Final    Comment:                   **Effective September 17, 2020 [facility] will begin**                  reporting the 2021 CKD-EPI creatinine equation  that                  estimates kidney function without a race variable.          Passed - K in normal range and within 180 days    Potassium  Date Value Ref Range Status  09/12/2020 5.0 3.5 - 5.2 mmol/L Final         Passed - Patient is not pregnant      Passed - Last BP in normal range    BP Readings from Last 1 Encounters:  04/25/20 111/68         Passed - Valid encounter within last 6 months    Recent Outpatient Visits          1 month ago Controlled type 2 diabetes mellitus with hyperglycemia, with long-term current use of insulin [facility])   [place], Vernia Buff, NP   5 months ago Need for influenza vaccination   [place]  Delena Serve, RPH-CPP   5 months ago Controlled type 2 diabetes mellitus with hyperglycemia, with long-term current use of insulin [facility])   [place], [place] W, NP   11 months ago CKD stage 3 secondary to diabetes [facility])   [place], RPH-CPP   1 year ago Controlled type 2 diabetes mellitus with hyperglycemia, with long-term current use of insulin [facility])   [place], Zelda W, NP      Future Appointments            In 2 weeks Gildardo Pounds, NP [place]

## 2020-10-20 ENCOUNTER — Other Ambulatory Visit (HOSPITAL_COMMUNITY): Payer: Self-pay

## 2020-10-20 ENCOUNTER — Other Ambulatory Visit: Payer: Self-pay

## 2020-10-24 ENCOUNTER — Ambulatory Visit: Payer: Medicaid Other | Admitting: Nurse Practitioner

## 2020-10-25 ENCOUNTER — Other Ambulatory Visit (HOSPITAL_COMMUNITY): Payer: Self-pay

## 2020-10-25 MED FILL — Glimepiride Tab 2 MG: ORAL | 90 days supply | Qty: 90 | Fill #0 | Status: AC

## 2020-11-01 ENCOUNTER — Other Ambulatory Visit (HOSPITAL_COMMUNITY): Payer: Self-pay

## 2020-11-01 MED FILL — Furosemide Tab 20 MG: ORAL | 90 days supply | Qty: 45 | Fill #0 | Status: AC

## 2020-11-08 ENCOUNTER — Other Ambulatory Visit (HOSPITAL_COMMUNITY): Payer: Self-pay

## 2020-11-08 ENCOUNTER — Other Ambulatory Visit: Payer: Self-pay | Admitting: Nurse Practitioner

## 2020-11-08 DIAGNOSIS — G894 Chronic pain syndrome: Secondary | ICD-10-CM

## 2020-11-08 MED ORDER — DULOXETINE HCL 30 MG PO CPEP
30.0000 mg | ORAL_CAPSULE | Freq: Two times a day (BID) | ORAL | 0 refills | Status: DC
  Filled 2020-11-08: qty 180, 90d supply, fill #0

## 2020-11-08 MED FILL — Lisinopril Tab 5 MG: ORAL | 30 days supply | Qty: 30 | Fill #0 | Status: AC

## 2020-11-16 ENCOUNTER — Other Ambulatory Visit (HOSPITAL_COMMUNITY): Payer: Self-pay

## 2020-11-16 MED FILL — Insulin Glargine Soln Pen-Injector 100 Unit/ML: SUBCUTANEOUS | 30 days supply | Qty: 15 | Fill #0 | Status: AC

## 2020-12-07 ENCOUNTER — Other Ambulatory Visit (HOSPITAL_COMMUNITY): Payer: Self-pay

## 2020-12-07 MED FILL — Lovastatin Tab 40 MG: ORAL | 90 days supply | Qty: 90 | Fill #0 | Status: AC

## 2020-12-07 MED FILL — Lisinopril Tab 5 MG: ORAL | 30 days supply | Qty: 30 | Fill #1 | Status: AC

## 2020-12-13 ENCOUNTER — Other Ambulatory Visit (HOSPITAL_COMMUNITY): Payer: Self-pay

## 2020-12-13 MED FILL — Allopurinol Tab 100 MG: ORAL | 90 days supply | Qty: 180 | Fill #0 | Status: AC

## 2021-01-07 ENCOUNTER — Other Ambulatory Visit (HOSPITAL_COMMUNITY): Payer: Self-pay

## 2021-01-07 ENCOUNTER — Other Ambulatory Visit: Payer: Self-pay | Admitting: Nurse Practitioner

## 2021-01-07 DIAGNOSIS — I10 Essential (primary) hypertension: Secondary | ICD-10-CM

## 2021-01-07 MED ORDER — LISINOPRIL 5 MG PO TABS
5.0000 mg | ORAL_TABLET | Freq: Every day | ORAL | 2 refills | Status: DC
  Filled 2021-01-07: qty 30, 30d supply, fill #0
  Filled 2021-02-04: qty 30, 30d supply, fill #1
  Filled 2021-03-08: qty 30, 30d supply, fill #2

## 2021-01-11 ENCOUNTER — Ambulatory Visit: Payer: 59 | Attending: Nurse Practitioner | Admitting: Nurse Practitioner

## 2021-01-11 ENCOUNTER — Other Ambulatory Visit (HOSPITAL_COMMUNITY): Payer: Self-pay

## 2021-01-11 ENCOUNTER — Other Ambulatory Visit: Payer: Self-pay

## 2021-01-11 DIAGNOSIS — E785 Hyperlipidemia, unspecified: Secondary | ICD-10-CM

## 2021-01-11 DIAGNOSIS — E1165 Type 2 diabetes mellitus with hyperglycemia: Secondary | ICD-10-CM | POA: Diagnosis not present

## 2021-01-11 DIAGNOSIS — Z794 Long term (current) use of insulin: Secondary | ICD-10-CM | POA: Diagnosis not present

## 2021-01-11 MED ORDER — INSULIN GLARGINE 100 UNIT/ML SOLOSTAR PEN
26.0000 [IU] | PEN_INJECTOR | Freq: Every day | SUBCUTANEOUS | 11 refills | Status: DC
  Filled 2021-01-11: qty 15, 57d supply, fill #0
  Filled 2021-03-04: qty 15, 57d supply, fill #1

## 2021-01-11 MED ORDER — INSULIN PEN NEEDLE 31G X 8 MM MISC
1 refills | Status: DC
  Filled 2021-01-11: qty 100, 90d supply, fill #0
  Filled 2021-04-22: qty 100, 90d supply, fill #1

## 2021-01-11 NOTE — Progress Notes (Signed)
Virtual Visit via Telephone Note Due to national recommendations of social distancing due to [place] 19, telehealth visit is felt to be most appropriate for this patient at this time.  I discussed the limitations, risks, security and privacy concerns of performing an evaluation and management service by telephone and the availability of in person appointments. I also discussed with the patient that there may be a patient responsible charge related to this service. The patient expressed understanding and agreed to proceed.    I connected with Kristy Flores on 01/11/21  at   3:50 PM EDT  EDT by telephone and verified that I am speaking with the correct person using two identifiers.  Location of Patient: Private Residence   Location of Provider: [place] and [employer] Office    Persons participating in Telemedicine visit: Geryl Rankins FNP-BC Kristy Flores    History of Present Illness: Telemedicine visit for: FOLLOW UP She has a past medical history of Depression, Diabetes mellitus without complication ([place]), Hyperlipidemia, Hypertension, Insomnia, and Restless leg syndrome.    She declines mammogram.   She lost her son a week ago Sunday night unexpectedly.   DM2 Blood glucose levels at home "good". She is current down to 26 units on Lantus from 30 units. She does not administer lantus daily due to low blood glucose readings. She endorses adherence taking glimepiride 2 mg daily. Weight is down to 232 lbs from last office visit reading of 245lbs.  Cholesterol levels not at goal with lovastatin 40mg  daily.  Lab Results  Component Value Date   HGBA1C 6.2 (H) 09/12/2020    Lab Results  Component Value Date   LDLCALC 117 (H) 09/12/2020    Wt Readings from Last 3 Encounters:  04/25/20 245 lb (111.1 kg)  10/10/19 255 lb (115.7 kg)  04/12/19 262 lb (118.8 kg)    Blood pressure is well controlled. She is taking prn lasix and renal dose lisinopril 5 mg daily. Denies chest pain,  shortness of breath, palpitations, lightheadedness, dizziness, headaches or BLE edema.   BP Readings from Last 3 Encounters:  04/25/20 111/68  10/10/19 124/75  04/25/19 102/64      Past Medical History:  Diagnosis Date   Depression    Diabetes mellitus without complication (HCC)    Hyperlipidemia    Hypertension    Insomnia    Restless leg syndrome     Past Surgical History:  Procedure Laterality Date   ABDOMINAL HYSTERECTOMY     MELANOMA EXCISION  1985    Family History  Problem Relation Age of Onset   Diabetes Mother    Cancer Father     Social History   Socioeconomic History   Marital status: Divorced    Spouse name: Not on file   Number of children: Not on file   Years of education: Not on file   Highest education level: Not on file  Occupational History   Not on file  Tobacco Use   Smoking status: Never   Smokeless tobacco: Never  Vaping Use   Vaping Use: Never used  Substance and Sexual Activity   Alcohol use: Not Currently   Drug use: Not Currently   Sexual activity: Not Currently  Other Topics Concern   Not on file  Social History Narrative   Not on file   Social Determinants of Health   Financial Resource Strain: Not on file  Food Insecurity: Not on file  Transportation Needs: Not on file  Physical Activity: Not on file  Stress:  Not on file  Social Connections: Not on file     Observations/Objective: Awake, alert and oriented x 3   Review of Systems  Constitutional:  Negative for fever, malaise/fatigue and weight loss.  HENT: Negative.  Negative for nosebleeds.   Eyes: Negative.  Negative for blurred vision, double vision and photophobia.  Respiratory: Negative.  Negative for cough and shortness of breath.   Cardiovascular: Negative.  Negative for chest pain, palpitations and leg swelling.  Gastrointestinal: Negative.  Negative for heartburn, nausea and vomiting.  Musculoskeletal: Negative.  Negative for myalgias.  Neurological:  Negative.  Negative for dizziness, focal weakness, seizures and headaches.  Psychiatric/Behavioral: Negative.  Negative for suicidal ideas.    Assessment and Plan: Diagnoses and all orders for this visit:  Controlled type 2 diabetes mellitus with hyperglycemia, with long-term current use of insulin (HCC) -     insulin glargine (LANTUS) 100 UNIT/ML Solostar Pen; Inject 26 Units into the skin at bedtime. -     Insulin Pen Needle 31G X 8 MM MISC; USE AS INSTRUCTED. INJECT INTO THE SKIN ONCE NIGHTLY. Continue blood sugar control as discussed in office today, low carbohydrate diet, and regular physical exercise as tolerated, 150 minutes per week (30 min each day, 5 days per week, or 50 min 3 days per week). Keep blood sugar logs with fasting goal of 90-130 mg/dl, post prandial (after you eat) less than 180.  For Hypoglycemia: BS <60 and Hyperglycemia BS >400; contact the clinic ASAP. Annual eye exams and foot exams are recommended.   Dyslipidemia, goal LDL below 70 INSTRUCTIONS: Work on a low fat, heart healthy diet and participate in regular aerobic exercise program by working out at least 150 minutes per week; 5 days a week-30 minutes per day. Avoid red meat/beef/steak,  fried foods. junk foods, sodas, sugary drinks, unhealthy snacking, alcohol and smoking.  Drink at least 80 oz of water per day and monitor your carbohydrate intake daily.      Follow Up Instructions Return in about 3 months (around 04/13/2021).     I discussed the assessment and treatment plan with the patient. The patient was provided an opportunity to ask questions and all were answered. The patient agreed with the plan and demonstrated an understanding of the instructions.   The patient was advised to call back or seek an in-person evaluation if the symptoms worsen or if the condition fails to improve as anticipated.  I provided 15 minutes of non-face-to-face time during this encounter including median intraservice time,  reviewing previous notes, labs, imaging, medications and explaining diagnosis and management.  Gildardo Pounds, FNP-BC

## 2021-01-13 ENCOUNTER — Encounter: Payer: Self-pay | Admitting: Nurse Practitioner

## 2021-01-25 ENCOUNTER — Other Ambulatory Visit (HOSPITAL_COMMUNITY): Payer: Self-pay

## 2021-01-25 ENCOUNTER — Other Ambulatory Visit: Payer: Self-pay | Admitting: Nurse Practitioner

## 2021-01-25 DIAGNOSIS — E1165 Type 2 diabetes mellitus with hyperglycemia: Secondary | ICD-10-CM

## 2021-01-25 MED ORDER — GLIMEPIRIDE 2 MG PO TABS
2.0000 mg | ORAL_TABLET | Freq: Every day | ORAL | 1 refills | Status: DC
  Filled 2021-01-25: qty 90, 90d supply, fill #0
  Filled 2021-04-22: qty 90, 90d supply, fill #1

## 2021-01-25 MED FILL — Glimepiride Tab 2 MG: ORAL | 30 days supply | Qty: 30 | Fill #1 | Status: CN

## 2021-02-04 ENCOUNTER — Other Ambulatory Visit (HOSPITAL_COMMUNITY): Payer: Self-pay

## 2021-02-04 ENCOUNTER — Other Ambulatory Visit: Payer: Self-pay | Admitting: Nurse Practitioner

## 2021-02-04 DIAGNOSIS — G894 Chronic pain syndrome: Secondary | ICD-10-CM

## 2021-02-04 MED ORDER — DULOXETINE HCL 30 MG PO CPEP
30.0000 mg | ORAL_CAPSULE | Freq: Two times a day (BID) | ORAL | 0 refills | Status: DC
  Filled 2021-02-04: qty 180, 90d supply, fill #0

## 2021-02-04 MED FILL — Furosemide Tab 20 MG: ORAL | 30 days supply | Qty: 15 | Fill #1 | Status: AC

## 2021-03-04 ENCOUNTER — Other Ambulatory Visit (HOSPITAL_COMMUNITY): Payer: Self-pay

## 2021-03-04 ENCOUNTER — Other Ambulatory Visit: Payer: Self-pay | Admitting: Nurse Practitioner

## 2021-03-04 MED ORDER — FUROSEMIDE 20 MG PO TABS
20.0000 mg | ORAL_TABLET | ORAL | 0 refills | Status: DC
  Filled 2021-03-04: qty 15, 30d supply, fill #0

## 2021-03-04 NOTE — Telephone Encounter (Signed)
  Notes to clinic:  patient has upcoming appt on 8/26/202  Failed protocol: Cr in normal range and within 360 days   Requested Prescriptions  Pending Prescriptions Disp Refills   furosemide (LASIX) 20 MG tablet 45 tablet 1    Sig: TAKE 1 TABLET (20 MG TOTAL) BY MOUTH EVERY OTHER DAY.     Cardiovascular:  Diuretics - Loop Failed - 03/04/2021  9:09 AM      Failed - Cr in normal range and within 360 days    Creatinine, Ser  Date Value Ref Range Status  09/12/2020 1.61 (H) 0.57 - 1.00 mg/dL Final    Comment:                   **Effective September 17, 2020 [facility] will begin**                  reporting the 2021 CKD-EPI creatinine equation that                  estimates kidney function without a race variable.           Passed - K in normal range and within 360 days    Potassium  Date Value Ref Range Status  09/12/2020 5.0 3.5 - 5.2 mmol/L Final          Passed - Ca in normal range and within 360 days    Calcium  Date Value Ref Range Status  09/12/2020 9.8 8.7 - 10.3 mg/dL Final          Passed - Na in normal range and within 360 days    Sodium  Date Value Ref Range Status  09/12/2020 138 134 - 144 mmol/L Final          Passed - Last BP in normal range    BP Readings from Last 1 Encounters:  04/25/20 111/68          Passed - Valid encounter within last 6 months    Recent Outpatient Visits           1 month ago Controlled type 2 diabetes mellitus with hyperglycemia, with long-term current use of insulin [facility])   [place], [place] W, NP   6 months ago Controlled type 2 diabetes mellitus with hyperglycemia, with long-term current use of insulin [facility])   [place], Vernia Buff, NP   10 months ago Need for influenza vaccination   [place], Annie Main L, RPH-CPP   10 months ago Controlled type 2 diabetes mellitus with hyperglycemia, with long-term  current use of insulin [facility])   [place], Vernia Buff, NP   1 year ago CKD stage 3 secondary to diabetes [facility])   [place], RPH-CPP       Future Appointments             In 1 week Gildardo Pounds, NP [place]

## 2021-03-08 ENCOUNTER — Other Ambulatory Visit (HOSPITAL_COMMUNITY): Payer: Self-pay

## 2021-03-08 MED FILL — Lovastatin Tab 40 MG: ORAL | 90 days supply | Qty: 90 | Fill #1 | Status: AC

## 2021-03-14 ENCOUNTER — Other Ambulatory Visit (HOSPITAL_COMMUNITY): Payer: Self-pay

## 2021-03-14 ENCOUNTER — Other Ambulatory Visit: Payer: Self-pay | Admitting: Nurse Practitioner

## 2021-03-14 DIAGNOSIS — M1A09X0 Idiopathic chronic gout, multiple sites, without tophus (tophi): Secondary | ICD-10-CM

## 2021-03-14 NOTE — Telephone Encounter (Signed)
   Notes to clinic:  Patient has appt tomorrow    Requested Prescriptions  Pending Prescriptions Disp Refills   allopurinol (ZYLOPRIM) 100 MG tablet 180 tablet 1    Sig: TAKE 2 TABLETS (200 MG TOTAL) BY MOUTH DAILY.     Endocrinology:  Gout Agents Failed - 03/14/2021 11:23 AM      Failed - Uric Acid in normal range and within 360 days    Uric Acid  Date Value Ref Range Status  04/12/2019 9.3 (H) 2.5 - 7.1 mg/dL Final    Comment:               Therapeutic target for gout patients: <6.0          Failed - Cr in normal range and within 360 days    Creatinine, Ser  Date Value Ref Range Status  09/12/2020 1.61 (H) 0.57 - 1.00 mg/dL Final    Comment:                   **Effective September 17, 2020 [facility] will begin**                  reporting the 2021 CKD-EPI creatinine equation that                  estimates kidney function without a race variable.           Passed - Valid encounter within last 12 months    Recent Outpatient Visits           2 months ago Controlled type 2 diabetes mellitus with hyperglycemia, with long-term current use of insulin [facility])   [place], [place] W, NP   6 months ago Controlled type 2 diabetes mellitus with hyperglycemia, with long-term current use of insulin [facility])   [place], Vernia Buff, NP   10 months ago Need for influenza vaccination   [place], Jarome [place]atin, RPH-CPP   10 months ago Controlled type 2 diabetes mellitus with hyperglycemia, with long-term current use of insulin [facility])   [place], Vernia Buff, NP   1 year ago CKD stage 3 secondary to diabetes [facility])   [place], Jarome [place]atin, RPH-CPP       Future Appointments             Tomorrow Gildardo Pounds, NP [place]

## 2021-03-15 ENCOUNTER — Other Ambulatory Visit: Payer: Self-pay

## 2021-03-15 ENCOUNTER — Ambulatory Visit: Payer: 59 | Attending: Nurse Practitioner | Admitting: Nurse Practitioner

## 2021-03-15 ENCOUNTER — Other Ambulatory Visit (HOSPITAL_COMMUNITY): Payer: Self-pay

## 2021-03-15 VITALS — BP 116/74 | HR 95 | Ht 62.0 in | Wt 229.2 lb

## 2021-03-15 DIAGNOSIS — Z1211 Encounter for screening for malignant neoplasm of colon: Secondary | ICD-10-CM

## 2021-03-15 DIAGNOSIS — Z794 Long term (current) use of insulin: Secondary | ICD-10-CM

## 2021-03-15 DIAGNOSIS — E785 Hyperlipidemia, unspecified: Secondary | ICD-10-CM | POA: Diagnosis not present

## 2021-03-15 DIAGNOSIS — M1A09X0 Idiopathic chronic gout, multiple sites, without tophus (tophi): Secondary | ICD-10-CM

## 2021-03-15 DIAGNOSIS — F331 Major depressive disorder, recurrent, moderate: Secondary | ICD-10-CM | POA: Diagnosis not present

## 2021-03-15 DIAGNOSIS — G894 Chronic pain syndrome: Secondary | ICD-10-CM

## 2021-03-15 DIAGNOSIS — N183 Chronic kidney disease, stage 3 unspecified: Secondary | ICD-10-CM

## 2021-03-15 DIAGNOSIS — E1165 Type 2 diabetes mellitus with hyperglycemia: Secondary | ICD-10-CM | POA: Diagnosis not present

## 2021-03-15 DIAGNOSIS — E1122 Type 2 diabetes mellitus with diabetic chronic kidney disease: Secondary | ICD-10-CM | POA: Diagnosis not present

## 2021-03-15 DIAGNOSIS — L989 Disorder of the skin and subcutaneous tissue, unspecified: Secondary | ICD-10-CM

## 2021-03-15 LAB — POCT GLYCOSYLATED HEMOGLOBIN (HGB A1C): HbA1c, POC (controlled diabetic range): 5.8 % (ref 0.0–7.0)

## 2021-03-15 MED ORDER — LOVASTATIN 40 MG PO TABS
40.0000 mg | ORAL_TABLET | Freq: Every day | ORAL | 2 refills | Status: DC
  Filled 2021-03-15: qty 90, fill #0
  Filled 2021-06-10: qty 90, 90d supply, fill #0
  Filled 2021-09-20: qty 90, 90d supply, fill #1
  Filled 2021-12-20: qty 90, 90d supply, fill #2

## 2021-03-15 MED ORDER — DULOXETINE HCL 30 MG PO CPEP: 30.0000 mg | ORAL_CAPSULE | Freq: Two times a day (BID) | ORAL | 0 refills | Status: DC

## 2021-03-15 MED ORDER — ALLOPURINOL 100 MG PO TABS
200.0000 mg | ORAL_TABLET | Freq: Every day | ORAL | 1 refills | Status: DC
  Filled 2021-03-15: qty 180, 90d supply, fill #0
  Filled 2021-06-10: qty 180, 90d supply, fill #1

## 2021-03-15 MED ORDER — QUETIAPINE FUMARATE 50 MG PO TABS
50.0000 mg | ORAL_TABLET | Freq: Every day | ORAL | 1 refills | Status: DC
  Filled 2021-03-15: qty 30, 30d supply, fill #0

## 2021-03-15 NOTE — Progress Notes (Signed)
Assessment & Plan:  [name] was seen today for diabetes.  Diagnoses and all orders for this visit:  Controlled type 2 diabetes mellitus with hyperglycemia, with long-term current use of insulin (HCC) -     POCT glycosylated hemoglobin (Hb A1C) -     lovastatin (MEVACOR) 40 MG tablet; Take 1 tablet (40 mg total) by mouth at bedtime. Continue blood sugar control as discussed in office today, low carbohydrate diet, and regular physical exercise as tolerated, 150 minutes per week (30 min each day, 5 days per week, or 50 min 3 days per week). Keep blood sugar logs with fasting goal of 90-130 mg/dl, post prandial (after you eat) less than 180.  For Hypoglycemia: BS <60 and Hyperglycemia BS >400; contact the clinic ASAP. Annual eye exams and foot exams are recommended.   Chronic gout of multiple sites, unspecified cause -     allopurinol (ZYLOPRIM) 100 MG tablet; Take 2 tablets (200 mg total) by mouth daily.  Chronic pain syndrome -     DULoxetine (CYMBALTA) 30 MG capsule; Take 1 capsule (30 mg total) by mouth 2 (two) times daily.  Skin lesion -     Ambulatory referral to Dermatology  Dyslipidemia, goal LDL below 70 -     Lipid panel -     lovastatin (MEVACOR) 40 MG tablet; Take 1 tablet (40 mg total) by mouth at bedtime. INSTRUCTIONS: Work on a low fat, heart healthy diet and participate in regular aerobic exercise program by working out at least 150 minutes per week; 5 days a week-30 minutes per day. Avoid red meat/beef/steak,  fried foods. junk foods, sodas, sugary drinks, unhealthy snacking, alcohol and smoking.  Drink at least 80 oz of water per day and monitor your carbohydrate intake daily.    CKD stage 3 secondary to diabetes (HCC) -     CBC -     CMP14+EGFR  Colon cancer screening -     Fecal occult blood, imunochemical([facility])  Moderate episode of recurrent major depressive disorder (HCC) -     QUEtiapine (SEROQUEL) 50 MG tablet; Take 1 tablet (50 mg total) by mouth  at bedtime.   Patient has been counseled on age-appropriate routine health concerns for screening and prevention. These are reviewed and up-to-date. Referrals have been placed accordingly. Immunizations are up-to-date or declined.    Subjective:   Chief Complaint  Patient presents with   Diabetes   HPI Kristy Flores 65 y.o. female presents to office today for follow up to DM  has a past medical history of Depression, Diabetes mellitus without complication ([place]), Hyperlipidemia, Hypertension, Insomnia, and Restless leg syndrome.   She had a fall recently. Missed a step coming into the doorway of her home. Has an abrasion on the anterior right lower which is healing. She initially had right foot and ankle pain but now able to bear full weight with no pain.   DM 2 Well controlled. Average home fasting glucose readings: 130. Postprandial -140 Lab Results  Component Value Date   HGBA1C 5.8 03/15/2021    Depression Taking cymbalta but does not feel it is very effective. She is also still grieving the unexpected loss of one of her sons. She has been tried on celexa, xanax, and lexapro in the past.  Depression screen [facility] 2/9 03/15/2021 09/05/2020 04/25/2020 10/10/2019 05/03/2019  Decreased Interest 2 0 3 1 0  Down, Depressed, Hopeless 3 2 3 3  0  PHQ - 2 Score 5 2 6 4  0  Altered sleeping  3 2 3 3 3   Tired, decreased energy 3 0 3 2 0  Change in appetite 3 0 2 2 0  Feeling bad or failure about yourself  3 0 3 3 0  Trouble concentrating 2 2 3 2  0  Moving slowly or fidgety/restless 3 0 0 2 0  Suicidal thoughts 2 0 2 3 0  PHQ-9 Score 24 6 22 21 3     Skin lesions Notes change in texture of lesions over time. One located on the right thigh and other on the lower left leg. Will refer to Dermatology      Review of Systems  Constitutional:  Negative for fever, malaise/fatigue and weight loss.  HENT: Negative.  Negative for nosebleeds.   Eyes: Negative.  Negative for blurred vision, double vision  and photophobia.  Respiratory: Negative.  Negative for cough and shortness of breath.   Cardiovascular: Negative.  Negative for chest pain, palpitations and leg swelling.  Gastrointestinal: Negative.  Negative for heartburn, nausea and vomiting.  Musculoskeletal:  Positive for joint pain and myalgias.  Skin:        Hyperpigmented macular lesion  Neurological: Negative.  Negative for dizziness, focal weakness, seizures and headaches.  Psychiatric/Behavioral:  Positive for depression. Negative for suicidal ideas. The patient is nervous/anxious.    Past Medical History:  Diagnosis Date   Depression    Diabetes mellitus without complication ([place])    Hyperlipidemia    Hypertension    Insomnia    Restless leg syndrome     Past Surgical History:  Procedure Laterality Date   ABDOMINAL HYSTERECTOMY     MELANOMA EXCISION  1985    Family History  Problem Relation Age of Onset   Diabetes Mother    Cancer Father     Social History Reviewed with no changes to be made today.   Outpatient Medications Prior to Visit  Medication Sig Dispense Refill   Blood Glucose Monitoring Suppl (TRUE METRIX METER) w/Device KIT Use as instructed. Check blood glucose level by fingerstick twice per day. 1 kit 0   furosemide (LASIX) 20 MG tablet Take 1 tablet (20 mg total) by mouth every other day. 15 tablet 0   glimepiride (AMARYL) 2 MG tablet Take 1 tablet (2 mg total) by mouth daily before breakfast. 90 tablet 1   glucose blood (TRUE METRIX BLOOD GLUCOSE TEST) test strip Use as instructed 100 each 12   insulin glargine (LANTUS) 100 UNIT/ML Solostar Pen Inject 26 Units into the skin at bedtime. 15 mL 11   Insulin Pen Needle 31G X 8 MM MISC USE AS INSTRUCTED. INJECT INTO THE SKIN ONCE NIGHTLY. 100 each 1   lisinopril (ZESTRIL) 5 MG tablet Take 1 tablet (5 mg total) by mouth daily. 30 tablet 2   TRUEplus Lancets 28G MISC Use as instructed. Check blood glucose level by fingerstick twice per day. 100 each 3    allopurinol (ZYLOPRIM) 100 MG tablet TAKE 2 TABLETS (200 MG TOTAL) BY MOUTH DAILY. 180 tablet 1   DULoxetine (CYMBALTA) 30 MG capsule Take 1 capsule (30 mg total) by mouth 2 (two) times daily. 180 capsule 0   lovastatin (MEVACOR) 40 MG tablet TAKE 1 TABLET (40 MG TOTAL) BY MOUTH AT BEDTIME. 90 tablet 2   Vitamin D, Ergocalciferol, (DRISDOL) 1.25 MG (50000 UNIT) CAPS capsule Take 1 capsule (50,000 Units total) by mouth every 7 (seven) days. (Patient not taking: Reported on 03/15/2021) 12 capsule 1   No facility-administered medications prior to visit.  Allergies  Allergen Reactions   Codeine     Vomit   Gabapentin     AKI   Penicillins     Broke out, stomach upset.    Victoza [Liraglutide]     creatinine       Objective:    BP 116/74   Pulse 95   Ht 5' 2"  (1.575 m)   Wt 229 lb 3.2 oz (104 kg)   SpO2 96%   BMI 41.92 kg/m  Wt Readings from Last 3 Encounters:  03/15/21 229 lb 3.2 oz (104 kg)  04/25/20 245 lb (111.1 kg)  10/10/19 255 lb (115.7 kg)    Physical Exam Vitals and nursing note reviewed.  Constitutional:      Appearance: She is well-developed.  HENT:     Head: Normocephalic and atraumatic.  Cardiovascular:     Rate and Rhythm: Normal rate and regular rhythm.     Heart sounds: Normal heart sounds. No murmur heard.   No friction rub. No gallop.  Pulmonary:     Effort: Pulmonary effort is normal. No tachypnea or respiratory distress.     Breath sounds: Normal breath sounds. No decreased breath sounds, wheezing, rhonchi or rales.  Chest:     Chest wall: No tenderness.  Abdominal:     General: Bowel sounds are normal.     Palpations: Abdomen is soft.  Musculoskeletal:        General: Normal range of motion.     Cervical back: Normal range of motion.  Skin:    General: Skin is warm and dry.     Comments: SEE PHOTOS  Neurological:     Mental Status: She is alert and oriented to person, place, and time.     Coordination: Coordination normal.  Psychiatric:         Behavior: Behavior normal. Behavior is cooperative.        Thought Content: Thought content normal.        Judgment: Judgment normal.         Patient has been counseled extensively about nutrition and exercise as well as the importance of adherence with medications and regular follow-up. The patient was given clear instructions to go to ER or return to medical center if symptoms don't improve, worsen or new problems develop. The patient verbalized understanding.   Follow-up: Return in about 4 weeks (around 04/12/2021) for anxiety and depression .   Gildardo Pounds, FNP-BC [facility] [place], [place]   03/16/2021, 2:54 PM

## 2021-03-16 ENCOUNTER — Encounter: Payer: Self-pay | Admitting: Nurse Practitioner

## 2021-03-16 LAB — LIPID PANEL
Chol/HDL Ratio: 4.2 ratio (ref 0.0–4.4)
Cholesterol, Total: 192 mg/dL (ref 100–199)
HDL: 46 mg/dL (ref >39)
LDL Chol Calc (NIH): 115 mg/dL — ABNORMAL HIGH (ref 0–99)
Triglycerides: 174 mg/dL — ABNORMAL HIGH (ref 0–149)
VLDL Cholesterol Cal: 31 mg/dL (ref 5–40)

## 2021-03-16 LAB — CMP14+EGFR
ALT: 16 IU/L (ref 0–32)
AST: 21 IU/L (ref 0–40)
Albumin/Globulin Ratio: 2 (ref 1.2–2.2)
Albumin: 4.2 g/dL (ref 3.8–4.8)
Alkaline Phosphatase: 58 IU/L (ref 44–121)
BUN/Creatinine Ratio: 34 — ABNORMAL HIGH (ref 12–28)
BUN: 57 mg/dL — ABNORMAL HIGH (ref 8–27)
Bilirubin Total: <0.2 mg/dL (ref 0.0–1.2)
CO2: 18 mmol/L — ABNORMAL LOW (ref 20–29)
Calcium: 9.6 mg/dL (ref 8.7–10.3)
Chloride: 107 mmol/L — ABNORMAL HIGH (ref 96–106)
Creatinine, Ser: 1.68 mg/dL — ABNORMAL HIGH (ref 0.57–1.00)
Globulin, Total: 2.1 g/dL (ref 1.5–4.5)
Glucose: 69 mg/dL (ref 65–99)
Potassium: 5 mmol/L (ref 3.5–5.2)
Sodium: 139 mmol/L (ref 134–144)
Total Protein: 6.3 g/dL (ref 6.0–8.5)
eGFR: 34 mL/min/{1.73_m2} — ABNORMAL LOW (ref >59)

## 2021-03-16 LAB — CBC
Hematocrit: 37 % (ref 34.0–46.6)
Hemoglobin: 12.1 g/dL (ref 11.1–15.9)
MCH: 28.8 pg (ref 26.6–33.0)
MCHC: 32.7 g/dL (ref 31.5–35.7)
MCV: 88 fL (ref 79–97)
Platelets: 262 10*3/uL (ref 150–450)
RBC: 4.2 x10E6/uL (ref 3.77–5.28)
RDW: 14.1 % (ref 11.7–15.4)
WBC: 9.6 10*3/uL (ref 3.4–10.8)

## 2021-04-08 ENCOUNTER — Other Ambulatory Visit: Payer: Self-pay | Admitting: Family Medicine

## 2021-04-08 ENCOUNTER — Other Ambulatory Visit (HOSPITAL_COMMUNITY): Payer: Self-pay

## 2021-04-08 ENCOUNTER — Other Ambulatory Visit: Payer: Self-pay | Admitting: Nurse Practitioner

## 2021-04-08 DIAGNOSIS — I10 Essential (primary) hypertension: Secondary | ICD-10-CM

## 2021-04-08 MED ORDER — LISINOPRIL 5 MG PO TABS
5.0000 mg | ORAL_TABLET | Freq: Every day | ORAL | 2 refills | Status: DC
  Filled 2021-04-08: qty 30, 30d supply, fill #0
  Filled 2021-05-06: qty 30, 30d supply, fill #1
  Filled 2021-06-10: qty 30, 30d supply, fill #2

## 2021-04-08 MED ORDER — FUROSEMIDE 20 MG PO TABS
20.0000 mg | ORAL_TABLET | ORAL | 0 refills | Status: DC
  Filled 2021-04-08: qty 30, 60d supply, fill #0

## 2021-04-09 ENCOUNTER — Other Ambulatory Visit: Payer: Self-pay | Admitting: Nurse Practitioner

## 2021-04-09 ENCOUNTER — Other Ambulatory Visit (HOSPITAL_COMMUNITY): Payer: Self-pay

## 2021-04-09 ENCOUNTER — Encounter: Payer: Self-pay | Admitting: Nurse Practitioner

## 2021-04-09 DIAGNOSIS — E1165 Type 2 diabetes mellitus with hyperglycemia: Secondary | ICD-10-CM

## 2021-04-09 DIAGNOSIS — Z794 Long term (current) use of insulin: Secondary | ICD-10-CM

## 2021-04-09 MED ORDER — INSULIN GLARGINE 100 UNIT/ML SOLOSTAR PEN
26.0000 [IU] | PEN_INJECTOR | Freq: Every day | SUBCUTANEOUS | 11 refills | Status: DC
  Filled 2021-04-09 – 2021-05-06 (×2): qty 15, 57d supply, fill #0
  Filled 2021-07-05: qty 15, 57d supply, fill #1
  Filled 2021-09-02: qty 15, 57d supply, fill #2

## 2021-04-10 ENCOUNTER — Other Ambulatory Visit (HOSPITAL_COMMUNITY): Payer: Self-pay

## 2021-04-10 NOTE — Telephone Encounter (Signed)
Called and informed the pt that her RX was sent to pharmacy. She stated that she understood.

## 2021-04-22 ENCOUNTER — Other Ambulatory Visit (HOSPITAL_COMMUNITY): Payer: Self-pay

## 2021-05-03 ENCOUNTER — Ambulatory Visit: Payer: 59 | Admitting: Nurse Practitioner

## 2021-05-06 ENCOUNTER — Other Ambulatory Visit: Payer: Self-pay | Admitting: Nurse Practitioner

## 2021-05-06 ENCOUNTER — Other Ambulatory Visit (HOSPITAL_COMMUNITY): Payer: Self-pay

## 2021-05-06 DIAGNOSIS — G894 Chronic pain syndrome: Secondary | ICD-10-CM

## 2021-05-06 MED ORDER — DULOXETINE HCL 30 MG PO CPEP
30.0000 mg | ORAL_CAPSULE | Freq: Two times a day (BID) | ORAL | 0 refills | Status: DC
  Filled 2021-05-06: qty 180, 90d supply, fill #0

## 2021-05-06 NOTE — Telephone Encounter (Signed)
Requested medication (s) are due for refill today - no  Requested medication (s) are on the active medication list -yes  Future visit scheduled -yes  Last refill: 03/15/21 #180  Notes to clinic: Call to pharmacy- they do not have this Rx- it was Classed as no print -Rx resent to pharmacy

## 2021-06-10 ENCOUNTER — Other Ambulatory Visit (HOSPITAL_COMMUNITY): Payer: Self-pay

## 2021-06-10 ENCOUNTER — Other Ambulatory Visit: Payer: Self-pay | Admitting: Nurse Practitioner

## 2021-06-10 MED ORDER — FUROSEMIDE 20 MG PO TABS
20.0000 mg | ORAL_TABLET | ORAL | 0 refills | Status: DC
  Filled 2021-06-10: qty 30, 60d supply, fill #0

## 2021-06-10 NOTE — Telephone Encounter (Signed)
Requested medication (s) are due for refill today:   Yes  Requested medication (s) are on the active medication list:   Yes  Future visit scheduled:   Yes in 1 wk with Raul Del   Last ordered: 04/08/2021 #30, 0 refills  Returned because a 30 day courtesy refill has been given.   Note is to keep next appt for further refills.   Provider to review for refills prior to upcoming appt.  Requested Prescriptions  Pending Prescriptions Disp Refills   furosemide (LASIX) 20 MG tablet 30 tablet 0    Sig: Take 1 tablet (20 mg total) by mouth every other day.     Cardiovascular:  Diuretics - Loop Failed - 06/10/2021  7:47 AM      Failed - Cr in normal range and within 360 days    Creatinine, Ser  Date Value Ref Range Status  03/15/2021 1.68 (H) 0.57 - 1.00 mg/dL Final          Passed - K in normal range and within 360 days    Potassium  Date Value Ref Range Status  03/15/2021 5.0 3.5 - 5.2 mmol/L Final          Passed - Ca in normal range and within 360 days    Calcium  Date Value Ref Range Status  03/15/2021 9.6 8.7 - 10.3 mg/dL Final          Passed - Na in normal range and within 360 days    Sodium  Date Value Ref Range Status  03/15/2021 139 134 - 144 mmol/L Final          Passed - Last BP in normal range    BP Readings from Last 1 Encounters:  03/15/21 116/74          Passed - Valid encounter within last 6 months    Recent Outpatient Visits           2 months ago Controlled type 2 diabetes mellitus with hyperglycemia, with long-term current use of insulin ([place])   [place], [place] W, NP   5 months ago Controlled type 2 diabetes mellitus with hyperglycemia, with long-term current use of insulin [facility])   [place], [place] W, NP   9 months ago Controlled type 2 diabetes mellitus with hyperglycemia, with long-term current use of insulin [facility])   [place],  Vernia Buff, NP   1 year ago Need for influenza vaccination   [place], Stephen L, RPH-CPP   1 year ago Controlled type 2 diabetes mellitus with hyperglycemia, with long-term current use of insulin [facility])   [place], Vernia Buff, NP       Future Appointments             In 1 week Gildardo Pounds, NP [place]   In 3 months [place], [place], [place] [facility], [facility]

## 2021-06-17 ENCOUNTER — Encounter: Payer: Self-pay | Admitting: Nurse Practitioner

## 2021-06-18 ENCOUNTER — Other Ambulatory Visit: Payer: Self-pay

## 2021-06-18 ENCOUNTER — Ambulatory Visit: Payer: 59 | Attending: Nurse Practitioner | Admitting: Nurse Practitioner

## 2021-06-18 DIAGNOSIS — Z794 Long term (current) use of insulin: Secondary | ICD-10-CM | POA: Diagnosis not present

## 2021-06-18 DIAGNOSIS — E1165 Type 2 diabetes mellitus with hyperglycemia: Secondary | ICD-10-CM

## 2021-06-18 NOTE — Progress Notes (Signed)
Virtual Visit via Telephone Note Due to national recommendations of social distancing due to [place] 19, telehealth visit is felt to be most appropriate for this patient at this time.  I discussed the limitations, risks, security and privacy concerns of performing an evaluation and management service by telephone and the availability of in person appointments. I also discussed with the patient that there may be a patient responsible charge related to this service. The patient expressed understanding and agreed to proceed.    I connected with [name] on 06/19/21  at   1:50 PM EST  EDT by telephone and verified that I am speaking with the correct person using two identifiers.  Location of Patient: Private Residence   Location of Provider: [place] participating in Telemedicine visit: Geryl Rankins FNP-BC [name]    History of Present Illness: Telemedicine visit for: DM 2   Well-controlled.  Blood glucose readings averaging 90-1 30s fasting and postprandial.  She endorses adherence taking Lantus 26 units at bedtime.  She is on renal dose ACE and lovastatin 40 mg daily although LDL is not controlled and she is likely related to dietary nonadherence. Lab Results  Component Value Date   HGBA1C 5.8 03/15/2021    Lab Results  Component Value Date   LDLCALC 115 (H) 03/15/2021    BP Readings from Last 3 Encounters:  03/15/21 116/74  04/25/20 111/68  10/10/19 124/75     Past Medical History:  Diagnosis Date   Depression    Diabetes mellitus without complication ([place])    Hyperlipidemia    Hypertension    Insomnia    Restless leg syndrome     Past Surgical History:  Procedure Laterality Date   ABDOMINAL HYSTERECTOMY     MELANOMA EXCISION  1985    Family History  Problem Relation Age of Onset   Diabetes Mother    Cancer Father     Social History   Socioeconomic History   Marital status: Divorced    Spouse name: Not on  file   Number of children: Not on file   Years of education: Not on file   Highest education level: Not on file  Occupational History   Not on file  Tobacco Use   Smoking status: Never   Smokeless tobacco: Never  Vaping Use   Vaping Use: Never used  Substance and Sexual Activity   Alcohol use: Not Currently   Drug use: Not Currently   Sexual activity: Not Currently  Other Topics Concern   Not on file  Social History Narrative   Not on file   Social Determinants of Health   Financial Resource Strain: Not on file  Food Insecurity: Not on file  Transportation Needs: Not on file  Physical Activity: Not on file  Stress: Not on file  Social Connections: Not on file     Observations/Objective: Awake, alert and oriented x 3   Review of Systems  Constitutional:  Negative for fever, malaise/fatigue and weight loss.  HENT: Negative.  Negative for nosebleeds.   Eyes: Negative.  Negative for blurred vision, double vision and photophobia.  Respiratory: Negative.  Negative for cough and shortness of breath.   Cardiovascular: Negative.  Negative for chest pain, palpitations and leg swelling.  Gastrointestinal: Negative.  Negative for heartburn, nausea and vomiting.  Musculoskeletal:  Positive for myalgias.  Neurological: Negative.  Negative for dizziness, focal weakness, seizures and headaches.  Psychiatric/Behavioral:  Positive for depression. Negative for suicidal  ideas.    Assessment and Plan: Diagnoses and all orders for this visit:  Controlled type 2 diabetes mellitus with hyperglycemia, with long-term current use of insulin ([place])  Continue blood sugar control as discussed in office today, low carbohydrate diet, and regular physical exercise as tolerated, 150 minutes per week (30 min each day, 5 days per week, or 50 min 3 days per week). Keep blood sugar logs with fasting goal of 90-130 mg/dl, post prandial (after you eat) less than 180.  For Hypoglycemia: BS <60 and  Hyperglycemia BS >400; contact the clinic ASAP. Annual eye exams and foot exams are recommended.   Follow Up Instructions Return in about 3 months (around 09/17/2021).     I discussed the assessment and treatment plan with the patient. The patient was provided an opportunity to ask questions and all were answered. The patient agreed with the plan and demonstrated an understanding of the instructions.   The patient was advised to call back or seek an in-person evaluation if the symptoms worsen or if the condition fails to improve as anticipated.  I provided 10 minutes of non-face-to-face time during this encounter including median intraservice time, reviewing previous notes, labs, imaging, medications and explaining diagnosis and management.  Gildardo Pounds, FNP-BC

## 2021-06-19 ENCOUNTER — Encounter: Payer: Self-pay | Admitting: Nurse Practitioner

## 2021-06-19 ENCOUNTER — Ambulatory Visit: Payer: 59 | Attending: Nurse Practitioner

## 2021-06-19 ENCOUNTER — Other Ambulatory Visit: Payer: Self-pay

## 2021-06-19 ENCOUNTER — Ambulatory Visit: Payer: 59 | Admitting: Nurse Practitioner

## 2021-06-19 DIAGNOSIS — Z23 Encounter for immunization: Secondary | ICD-10-CM

## 2021-07-05 ENCOUNTER — Other Ambulatory Visit: Payer: Self-pay | Admitting: Nurse Practitioner

## 2021-07-05 ENCOUNTER — Other Ambulatory Visit (HOSPITAL_COMMUNITY): Payer: Self-pay

## 2021-07-05 DIAGNOSIS — I10 Essential (primary) hypertension: Secondary | ICD-10-CM

## 2021-07-05 DIAGNOSIS — E1165 Type 2 diabetes mellitus with hyperglycemia: Secondary | ICD-10-CM

## 2021-07-05 DIAGNOSIS — Z794 Long term (current) use of insulin: Secondary | ICD-10-CM

## 2021-07-05 MED ORDER — LISINOPRIL 5 MG PO TABS
5.0000 mg | ORAL_TABLET | Freq: Every day | ORAL | 2 refills | Status: DC
  Filled 2021-07-05: qty 30, 30d supply, fill #0
  Filled 2021-08-07: qty 30, 30d supply, fill #1
  Filled 2021-09-12: qty 30, 30d supply, fill #2

## 2021-07-05 MED ORDER — UNIFINE PENTIPS 31G X 8 MM MISC
Freq: Every day | 1 refills | Status: DC
  Filled 2021-07-05: qty 100, 90d supply, fill #0

## 2021-07-25 ENCOUNTER — Other Ambulatory Visit: Payer: Self-pay | Admitting: Nurse Practitioner

## 2021-07-25 ENCOUNTER — Other Ambulatory Visit (HOSPITAL_COMMUNITY): Payer: Self-pay

## 2021-07-25 DIAGNOSIS — E1165 Type 2 diabetes mellitus with hyperglycemia: Secondary | ICD-10-CM

## 2021-07-25 DIAGNOSIS — Z794 Long term (current) use of insulin: Secondary | ICD-10-CM

## 2021-07-25 MED ORDER — GLIMEPIRIDE 2 MG PO TABS
2.0000 mg | ORAL_TABLET | Freq: Every day | ORAL | 0 refills | Status: DC
  Filled 2021-07-25: qty 90, 90d supply, fill #0

## 2021-08-07 ENCOUNTER — Other Ambulatory Visit (HOSPITAL_COMMUNITY): Payer: Self-pay

## 2021-08-07 ENCOUNTER — Other Ambulatory Visit: Payer: Self-pay | Admitting: Nurse Practitioner

## 2021-08-07 ENCOUNTER — Other Ambulatory Visit: Payer: Self-pay | Admitting: Family Medicine

## 2021-08-07 DIAGNOSIS — G894 Chronic pain syndrome: Secondary | ICD-10-CM

## 2021-08-07 MED ORDER — FUROSEMIDE 20 MG PO TABS
20.0000 mg | ORAL_TABLET | ORAL | 0 refills | Status: DC
  Filled 2021-08-07: qty 15, 30d supply, fill #0
  Filled 2021-09-02: qty 15, 30d supply, fill #1

## 2021-08-07 MED ORDER — DULOXETINE HCL 30 MG PO CPEP
30.0000 mg | ORAL_CAPSULE | Freq: Two times a day (BID) | ORAL | 0 refills | Status: DC
  Filled 2021-08-07: qty 180, 90d supply, fill #0

## 2021-08-07 NOTE — Telephone Encounter (Signed)
Requested Prescriptions  Pending Prescriptions Disp Refills   furosemide (LASIX) 20 MG tablet 30 tablet 0    Sig: Take 1 tablet (20 mg total) by mouth every other day.     Cardiovascular:  Diuretics - Loop Failed - 08/07/2021 10:42 AM      Failed - Cr in normal range and within 360 days    Creatinine, Ser  Date Value Ref Range Status  03/15/2021 1.68 (H) 0.57 - 1.00 mg/dL Final         Passed - K in normal range and within 360 days    Potassium  Date Value Ref Range Status  03/15/2021 5.0 3.5 - 5.2 mmol/L Final         Passed - Ca in normal range and within 360 days    Calcium  Date Value Ref Range Status  03/15/2021 9.6 8.7 - 10.3 mg/dL Final         Passed - Na in normal range and within 360 days    Sodium  Date Value Ref Range Status  03/15/2021 139 134 - 144 mmol/L Final         Passed - Last BP in normal range    BP Readings from Last 1 Encounters:  03/15/21 116/74         Passed - Valid encounter within last 6 months    Recent Outpatient Visits          1 month ago Controlled type 2 diabetes mellitus with hyperglycemia, with long-term current use of insulin ([place])   [place], [place] W, NP   4 months ago Controlled type 2 diabetes mellitus with hyperglycemia, with long-term current use of insulin [facility])   [place], [place] W, NP   6 months ago Controlled type 2 diabetes mellitus with hyperglycemia, with long-term current use of insulin [facility])   [place], [place] W, NP   11 months ago Controlled type 2 diabetes mellitus with hyperglycemia, with long-term current use of insulin [facility])   [place], Vernia Buff, NP   1 year ago Need for influenza vaccination   [place], RPH-CPP      Future Appointments            In 1 month Gildardo Pounds, NP [place]   In 1 month [place], [place], [place] [facility], [facility]

## 2021-08-07 NOTE — Telephone Encounter (Signed)
Requested Prescriptions  Pending Prescriptions Disp Refills   DULoxetine (CYMBALTA) 30 MG capsule 180 capsule 0    Sig: Take 1 capsule (30 mg total) by mouth 2 (two) times daily.     Psychiatry: Antidepressants - SNRI Passed - 08/07/2021 10:42 AM      Passed - Last BP in normal range    BP Readings from Last 1 Encounters:  03/15/21 116/74         Passed - Valid encounter within last 6 months    Recent Outpatient Visits          1 month ago Controlled type 2 diabetes mellitus with hyperglycemia, with long-term current use of insulin [facility])   [place], [place] W, NP   4 months ago Controlled type 2 diabetes mellitus with hyperglycemia, with long-term current use of insulin [facility])   [place], [place] W, NP   6 months ago Controlled type 2 diabetes mellitus with hyperglycemia, with long-term current use of insulin [facility])   [place], [place] W, NP   11 months ago Controlled type 2 diabetes mellitus with hyperglycemia, with long-term current use of insulin [facility])   [place], Vernia Buff, NP   1 year ago Need for influenza vaccination   [place], RPH-CPP      Future Appointments            In 1 month Gildardo Pounds, NP [place]   In 1 month [place], [place], [facility], [facility]

## 2021-09-02 ENCOUNTER — Other Ambulatory Visit: Payer: Self-pay

## 2021-09-02 ENCOUNTER — Other Ambulatory Visit (HOSPITAL_COMMUNITY): Payer: Self-pay

## 2021-09-02 ENCOUNTER — Telehealth: Payer: Self-pay | Admitting: Nurse Practitioner

## 2021-09-02 ENCOUNTER — Encounter: Payer: Self-pay | Admitting: Nurse Practitioner

## 2021-09-02 ENCOUNTER — Other Ambulatory Visit: Payer: Self-pay | Admitting: Nurse Practitioner

## 2021-09-02 DIAGNOSIS — E1165 Type 2 diabetes mellitus with hyperglycemia: Secondary | ICD-10-CM

## 2021-09-02 MED ORDER — INSULIN DETEMIR 100 UNIT/ML FLEXPEN
26.0000 [IU] | PEN_INJECTOR | Freq: Every day | SUBCUTANEOUS | 3 refills | Status: DC
  Filled 2021-09-02: qty 7.8, 30d supply, fill #0

## 2021-09-02 NOTE — Telephone Encounter (Signed)
See mychart message from today regarding patient's [place], says insurance will not cover and needs an alternative option. Please advise

## 2021-09-03 ENCOUNTER — Other Ambulatory Visit: Payer: Self-pay

## 2021-09-03 ENCOUNTER — Other Ambulatory Visit (HOSPITAL_COMMUNITY): Payer: Self-pay

## 2021-09-03 ENCOUNTER — Other Ambulatory Visit: Payer: Self-pay | Admitting: Nurse Practitioner

## 2021-09-03 DIAGNOSIS — E1165 Type 2 diabetes mellitus with hyperglycemia: Secondary | ICD-10-CM

## 2021-09-03 DIAGNOSIS — Z794 Long term (current) use of insulin: Secondary | ICD-10-CM

## 2021-09-03 MED ORDER — INSULIN DETEMIR 100 UNIT/ML FLEXPEN
26.0000 [IU] | PEN_INJECTOR | Freq: Every day | SUBCUTANEOUS | 1 refills | Status: DC
  Filled 2021-09-03: qty 21, 80d supply, fill #0

## 2021-09-12 ENCOUNTER — Other Ambulatory Visit (HOSPITAL_COMMUNITY): Payer: Self-pay

## 2021-09-13 ENCOUNTER — Ambulatory Visit: Payer: 59 | Admitting: Nurse Practitioner

## 2021-09-17 ENCOUNTER — Encounter: Payer: Self-pay | Admitting: Nurse Practitioner

## 2021-09-20 ENCOUNTER — Other Ambulatory Visit (HOSPITAL_COMMUNITY): Payer: Self-pay

## 2021-09-24 ENCOUNTER — Ambulatory Visit: Payer: 59 | Admitting: Physician Assistant

## 2021-09-27 ENCOUNTER — Other Ambulatory Visit (HOSPITAL_COMMUNITY): Payer: Self-pay

## 2021-09-27 ENCOUNTER — Other Ambulatory Visit: Payer: Self-pay | Admitting: Nurse Practitioner

## 2021-09-27 DIAGNOSIS — M1A09X0 Idiopathic chronic gout, multiple sites, without tophus (tophi): Secondary | ICD-10-CM

## 2021-09-27 MED ORDER — ALLOPURINOL 100 MG PO TABS
200.0000 mg | ORAL_TABLET | Freq: Every day | ORAL | 1 refills | Status: DC
  Filled 2021-09-27: qty 180, 90d supply, fill #0
  Filled 2022-01-01: qty 180, 90d supply, fill #1

## 2021-09-27 NOTE — Telephone Encounter (Signed)
Called patient to clarify pharmacy for medication. Patient reports send medications to [facility] outpatient pharmacy due to [place] does not take her insurance.  ?

## 2021-09-27 NOTE — Telephone Encounter (Signed)
Requested Prescriptions  ?Pending Prescriptions Disp Refills  ?? allopurinol (ZYLOPRIM) 100 MG tablet 180 tablet 1  ?  Sig: Take 2 tablets (200 mg total) by mouth daily.  ?  ? Endocrinology:  Gout Agents - allopurinol Failed - 09/27/2021  3:05 PM  ?  ?  Failed - Uric Acid in normal range and within 360 days  ?  Uric Acid  ?Date Value Ref Range Status  ?04/12/2019 9.3 (H) 2.5 - 7.1 mg/dL Final  ?  Comment:  ?             Therapeutic target for gout patients: <6.0  ?   ?  ?  Failed - Cr in normal range and within 360 days  ?  Creatinine, Ser  ?Date Value Ref Range Status  ?03/15/2021 1.68 (H) 0.57 - 1.00 mg/dL Final  ?   ?  ?  Failed - CBC within normal limits and completed in the last 12 months  ?  WBC  ?Date Value Ref Range Status  ?03/15/2021 9.6 3.4 - 10.8 x10E3/uL Final  ? ?RBC  ?Date Value Ref Range Status  ?03/15/2021 4.20 3.77 - 5.28 x10E6/uL Final  ? ?Hemoglobin  ?Date Value Ref Range Status  ?03/15/2021 12.1 11.1 - 15.9 g/dL Final  ? ?Hematocrit  ?Date Value Ref Range Status  ?03/15/2021 37.0 34.0 - 46.6 % Final  ? ?MCHC  ?Date Value Ref Range Status  ?03/15/2021 32.7 31.5 - 35.7 g/dL Final  ? ?MCH  ?Date Value Ref Range Status  ?03/15/2021 28.8 26.6 - 33.0 pg Final  ? ?MCV  ?Date Value Ref Range Status  ?03/15/2021 88 79 - 97 fL Final  ? ?No results found for: PLTCOUNTKUC, LABPLAT, [place] ?RDW  ?Date Value Ref Range Status  ?03/15/2021 14.1 11.7 - 15.4 % Final  ? ?  ?  ?  Passed - Valid encounter within last 12 months  ?  Recent Outpatient Visits   ?      ? 3 months ago Controlled type 2 diabetes mellitus with hyperglycemia, with long-term current use of insulin ([place])  ? [place], [place] W, NP  ? 6 months ago Controlled type 2 diabetes mellitus with hyperglycemia, with long-term current use of insulin ([place])  ? [place], [place] W, NP  ? 8 months ago Controlled type 2 diabetes mellitus with hyperglycemia, with long-term current use  of insulin ([place])  ? [place], [place] W, NP  ? 1 year ago Controlled type 2 diabetes mellitus with hyperglycemia, with long-term current use of insulin ([place])  ? [place], Vernia Buff, NP  ? 1 year ago Need for influenza vaccination  ? [place], RPH-CPP  ?  ?  ?Future Appointments   ?        ? In 1 week Gildardo Pounds, NP [place]  ?  ? ?  ?  ?  ? ?

## 2021-09-29 ENCOUNTER — Encounter: Payer: Self-pay | Admitting: Nurse Practitioner

## 2021-09-29 ENCOUNTER — Other Ambulatory Visit: Payer: Self-pay | Admitting: Nurse Practitioner

## 2021-09-29 DIAGNOSIS — E1165 Type 2 diabetes mellitus with hyperglycemia: Secondary | ICD-10-CM

## 2021-09-29 DIAGNOSIS — Z794 Long term (current) use of insulin: Secondary | ICD-10-CM

## 2021-09-29 MED ORDER — GLIMEPIRIDE 4 MG PO TABS
4.0000 mg | ORAL_TABLET | Freq: Every day | ORAL | 0 refills | Status: DC
  Filled 2021-09-29: qty 90, 90d supply, fill #0

## 2021-09-30 ENCOUNTER — Other Ambulatory Visit (HOSPITAL_COMMUNITY): Payer: Self-pay

## 2021-10-07 ENCOUNTER — Other Ambulatory Visit: Payer: Self-pay | Admitting: Nurse Practitioner

## 2021-10-07 ENCOUNTER — Other Ambulatory Visit (HOSPITAL_COMMUNITY): Payer: Self-pay

## 2021-10-08 ENCOUNTER — Other Ambulatory Visit (HOSPITAL_COMMUNITY): Payer: Self-pay

## 2021-10-08 MED ORDER — FUROSEMIDE 20 MG PO TABS
20.0000 mg | ORAL_TABLET | ORAL | 0 refills | Status: DC
Start: 2021-10-08 — End: 2021-12-20
  Filled 2021-10-08: qty 15, 30d supply, fill #0
  Filled 2021-11-04: qty 15, 30d supply, fill #1

## 2021-10-08 NOTE — Telephone Encounter (Signed)
Courtesy refill. appt tomorrow. ?Requested Prescriptions  ?Pending Prescriptions Disp Refills  ?? furosemide (LASIX) 20 MG tablet 30 tablet 0  ?  Sig: Take 1 tablet (20 mg total) by mouth every other day.  ?  ? Cardiovascular:  Diuretics - Loop Failed - 10/07/2021  8:12 AM  ?  ?  Failed - K in normal range and within 180 days  ?  Potassium  ?Date Value Ref Range Status  ?03/15/2021 5.0 3.5 - 5.2 mmol/L Final  ?   ?  ?  Failed - Ca in normal range and within 180 days  ?  Calcium  ?Date Value Ref Range Status  ?03/15/2021 9.6 8.7 - 10.3 mg/dL Final  ?   ?  ?  Failed - Na in normal range and within 180 days  ?  Sodium  ?Date Value Ref Range Status  ?03/15/2021 139 134 - 144 mmol/L Final  ?   ?  ?  Failed - Cr in normal range and within 180 days  ?  Creatinine, Ser  ?Date Value Ref Range Status  ?03/15/2021 1.68 (H) 0.57 - 1.00 mg/dL Final  ?   ?  ?  Failed - Cl in normal range and within 180 days  ?  Chloride  ?Date Value Ref Range Status  ?03/15/2021 107 (H) 96 - 106 mmol/L Final  ?   ?  ?  Failed - Mg Level in normal range and within 180 days  ?  No results found for: MG   ?  ?  Failed - Valid encounter within last 6 months  ?  Recent Outpatient Visits   ?      ? 3 months ago Controlled type 2 diabetes mellitus with hyperglycemia, with long-term current use of insulin ([place])  ? [place], [place] W, NP  ? 6 months ago Controlled type 2 diabetes mellitus with hyperglycemia, with long-term current use of insulin ([place])  ? [place], [place] W, NP  ? 9 months ago Controlled type 2 diabetes mellitus with hyperglycemia, with long-term current use of insulin ([place])  ? [place], [place] W, NP  ? 1 year ago Controlled type 2 diabetes mellitus with hyperglycemia, with long-term current use of insulin ([place])  ? [place], Vernia Buff, NP  ? 1 year ago Need for influenza vaccination   ? [place], RPH-CPP  ?  ?  ?Future Appointments   ?        ? Tomorrow Gildardo Pounds, NP [place]  ?  ? ?  ?  ?  Passed - Last BP in normal range  ?  BP Readings from Last 1 Encounters:  ?03/15/21 116/74  ?   ?  ?  ? ?

## 2021-10-09 ENCOUNTER — Ambulatory Visit: Payer: 59 | Attending: Nurse Practitioner | Admitting: Nurse Practitioner

## 2021-10-09 ENCOUNTER — Encounter: Payer: Self-pay | Admitting: Nurse Practitioner

## 2021-10-09 ENCOUNTER — Other Ambulatory Visit: Payer: Self-pay

## 2021-10-09 ENCOUNTER — Other Ambulatory Visit: Payer: Self-pay | Admitting: Nurse Practitioner

## 2021-10-09 VITALS — BP 108/68 | HR 77 | Resp 18 | Ht 62.0 in | Wt 227.1 lb

## 2021-10-09 DIAGNOSIS — F32A Depression, unspecified: Secondary | ICD-10-CM

## 2021-10-09 DIAGNOSIS — E785 Hyperlipidemia, unspecified: Secondary | ICD-10-CM

## 2021-10-09 DIAGNOSIS — N183 Chronic kidney disease, stage 3 unspecified: Secondary | ICD-10-CM

## 2021-10-09 DIAGNOSIS — Z1211 Encounter for screening for malignant neoplasm of colon: Secondary | ICD-10-CM

## 2021-10-09 DIAGNOSIS — I10 Essential (primary) hypertension: Secondary | ICD-10-CM

## 2021-10-09 DIAGNOSIS — Z794 Long term (current) use of insulin: Secondary | ICD-10-CM

## 2021-10-09 DIAGNOSIS — E559 Vitamin D deficiency, unspecified: Secondary | ICD-10-CM

## 2021-10-09 DIAGNOSIS — E1165 Type 2 diabetes mellitus with hyperglycemia: Secondary | ICD-10-CM

## 2021-10-09 DIAGNOSIS — F419 Anxiety disorder, unspecified: Secondary | ICD-10-CM

## 2021-10-09 DIAGNOSIS — Z1231 Encounter for screening mammogram for malignant neoplasm of breast: Secondary | ICD-10-CM

## 2021-10-09 LAB — POCT GLYCOSYLATED HEMOGLOBIN (HGB A1C): Hemoglobin A1C: 5.9 % — AB (ref 4.0–5.6)

## 2021-10-09 LAB — GLUCOSE, POCT (MANUAL RESULT ENTRY): POC Glucose: 135 mg/dl — AB (ref 70–99)

## 2021-10-09 MED ORDER — VITAMIN D (ERGOCALCIFEROL) 1.25 MG (50000 UNIT) PO CAPS
50000.0000 [IU] | ORAL_CAPSULE | ORAL | 1 refills | Status: DC
  Filled 2021-10-09: qty 12, 84d supply, fill #0
  Filled 2022-01-01: qty 12, 84d supply, fill #1

## 2021-10-09 MED ORDER — LISINOPRIL 5 MG PO TABS
5.0000 mg | ORAL_TABLET | Freq: Every day | ORAL | 2 refills | Status: DC
  Filled 2021-10-09: qty 30, 30d supply, fill #0

## 2021-10-09 MED ORDER — LISINOPRIL 5 MG PO TABS
5.0000 mg | ORAL_TABLET | Freq: Every day | ORAL | 1 refills | Status: DC
  Filled 2021-10-09: qty 90, 90d supply, fill #0

## 2021-10-09 NOTE — Progress Notes (Signed)
? ?Assessment & Plan:  ?Kristy Flores was seen today for diabetes. ? ?Diagnoses and all orders for this visit: ? ?Controlled type 2 diabetes mellitus with hyperglycemia, with long-term current use of insulin (HCC) ?-     POCT glycosylated hemoglobin (Hb A1C) ?-     POCT glucose (manual entry) ?Referred to eye doctor today ? ?Essential hypertension ?-     lisinopril (ZESTRIL) 5 MG tablet; Take 1 tablet (5 mg total) by mouth daily. Please fill as a 90 day supply ? ?Anxiety and depression ?Declines referral or SSRI at this time ? ?Vitamin D deficiency ?-     Vitamin D, Ergocalciferol, (DRISDOL) 1.25 MG (50000 UNIT) CAPS capsule; Take 1 capsule (50,000 Units total) by mouth every 7 (seven) days. ? ? ? ?Patient has been counseled on age-appropriate routine health concerns for screening and prevention. These are reviewed and up-to-date. Referrals have been placed accordingly. Immunizations are up-to-date or declined.    ?Subjective:  ? ?Chief Complaint  ?Patient presents with  ? Diabetes  ? ?HPI ?Kristy Flores 66 y.o. female presents to office today for follow up to DM. ?She has a past medical history of Depression, DM2, Hyperlipidemia, Hypertension, Insomnia, and Restless leg syndrome.  ? ? ?DM 2 ?States levemir caused severe myalgias and joint pain so this was stopped. We increased her glimepiride to 4 mg daily. Diabetes is well controlled based on A1c however she does endorse readings as high as 190s. LDL not at goal with lovastatin 40 mg daily. Blood pressure is well controlled with lisinopril 5 mg daily.  ?Lab Results  ?Component Value Date  ? HGBA1C 5.9 (A) 10/09/2021  ? ?Lab Results  ?Component Value Date  ? [place] 115 (H) 03/15/2021  ? ?BP Readings from Last 3 Encounters:  ?10/09/21 108/68  ?03/15/21 116/74  ?04/25/20 111/68  ?   ? ?Depression and Anxiety ?Declines referral for psychiatry today. States Wednesdays "are rough for me" as this is the day of the week when she was told one of her sons died. We had prescribed  celexa and seroquel in the past however she had undesirable side effects with both.  ? ?  10/09/2021  ?  4:18 PM 03/15/2021  ?  4:14 PM 09/05/2020  ? 11:21 AM  ?Depression screen PHQ 2/9  ?Decreased Interest 3 2 0  ?Down, Depressed, Hopeless 3 3 2   ?PHQ - 2 Score 6 5 2   ?Altered sleeping 3 3 2   ?Tired, decreased energy 3 3 0  ?Change in appetite 2 3 0  ?Feeling bad or failure about yourself  3 3 0  ?Trouble concentrating 3 2 2   ?Moving slowly or fidgety/restless 3 3 0  ?Suicidal thoughts 2 2 0  ?PHQ-9 Score 25 24 6   ?Difficult doing work/chores Extremely dIfficult    ?  ? ?  10/09/2021  ?  4:18 PM 03/15/2021  ?  4:15 PM 09/05/2020  ? 11:21 AM 04/25/2020  ?  8:47 AM  ?GAD 7 : Generalized Anxiety Score  ?Nervous, Anxious, on Edge 3 3 2 3   ?Control/stop worrying 3 3 1 3   ?Worry too much - different things 3 3 1 3   ?Trouble relaxing 3 3 1 3   ?Restless 3 3 0 3  ?Easily annoyed or irritable 3 3 2 3   ?Afraid - awful might happen 3 3 0 3  ?Total GAD 7 Score 21 21 7 21   ?Anxiety Difficulty Extremely difficult     ? ?  ? ?Review of Systems  ?Constitutional:  Negative for fever, malaise/fatigue and weight loss.  ?HENT: Negative.  Negative for nosebleeds.   ?Eyes: Negative.  Negative for blurred vision, double vision and photophobia.  ?Respiratory: Negative.  Negative for cough and shortness of breath.   ?Cardiovascular: Negative.  Negative for chest pain, palpitations and leg swelling.  ?Gastrointestinal: Negative.  Negative for heartburn, nausea and vomiting.  ?Musculoskeletal:  Positive for joint pain. Negative for myalgias.  ?Neurological: Negative.  Negative for dizziness, focal weakness, seizures and headaches.  ?Psychiatric/Behavioral:  Positive for depression. Negative for suicidal ideas. The patient is nervous/anxious.   ? ?Past Medical History:  ?Diagnosis Date  ? Depression   ? Diabetes mellitus without complication ([place])   ? Hyperlipidemia   ? Hypertension   ? Insomnia   ? Restless leg syndrome   ? ? ?Past Surgical  History:  ?Procedure Laterality Date  ? ABDOMINAL HYSTERECTOMY    ? [place]  ? ? ?Family History  ?Problem Relation Age of Onset  ? Diabetes Mother   ? Cancer Father   ? ? ?Social History Reviewed with no changes to be made today.  ? ?Outpatient Medications Prior to Visit  ?Medication Sig Dispense Refill  ? allopurinol (ZYLOPRIM) 100 MG tablet Take 2 tablets (200 mg total) by mouth daily. 180 tablet 1  ? Blood Glucose Monitoring Suppl (TRUE METRIX METER) w/Device KIT Use as instructed. Check blood glucose level by fingerstick twice per day. 1 kit 0  ? DULoxetine (CYMBALTA) 30 MG capsule Take 1 capsule (30 mg total) by mouth 2 (two) times daily. 180 capsule 0  ? furosemide (LASIX) 20 MG tablet Take 1 tablet (20 mg total) by mouth every other day. 30 tablet 0  ? glimepiride (AMARYL) 4 MG tablet Take 1 tablet (4 mg total) by mouth daily before breakfast. 90 tablet 0  ? glucose blood (TRUE METRIX BLOOD GLUCOSE TEST) test strip Use as instructed 100 each 12  ? Insulin Pen Needle (UNIFINE PENTIPS) 31G X 8 MM MISC Use once nightly as directed 100 each 1  ? lovastatin (MEVACOR) 40 MG tablet Take 1 tablet (40 mg total) by mouth at bedtime. 90 tablet 2  ? TRUEplus Lancets 28G MISC Use as instructed. Check blood glucose level by fingerstick twice per day. 100 each 3  ? DULoxetine (CYMBALTA) 30 MG capsule Take 1 capsule (30 mg total) by mouth 2 (two) times daily. 180 capsule 0  ? lisinopril (ZESTRIL) 5 MG tablet Take 1 tablet (5 mg total) by mouth daily. 30 tablet 2  ? QUEtiapine (SEROQUEL) 50 MG tablet Take 1 tablet (50 mg total) by mouth at bedtime. (Patient not taking: Reported on 10/09/2021) 30 tablet 1  ? insulin detemir (LEVEMIR) 100 UNIT/ML FlexPen Inject 26 Units into the skin at bedtime. (Patient not taking: Reported on 10/09/2021) 24 mL 1  ? Vitamin D, Ergocalciferol, (DRISDOL) 1.25 MG (50000 UNIT) CAPS capsule Take 1 capsule (50,000 Units total) by mouth every 7 (seven) days. (Patient not taking:  Reported on 10/09/2021) 12 capsule 1  ? ?No facility-administered medications prior to visit.  ? ? ?Allergies  ?Allergen Reactions  ? Codeine   ?  Vomit  ? Gabapentin   ?  AKI  ? Penicillins   ?  Broke out, stomach upset.   ? Victoza [Liraglutide]   ?  creatinine  ? ? ?   ?Objective:  ?  ?BP 108/68   Pulse 77   Resp 18   Ht 5' 2"  (1.575 m)   Wt  227 lb 2 oz (103 kg)   SpO2 99%   BMI 41.54 kg/m?  ?Wt Readings from Last 3 Encounters:  ?10/09/21 227 lb 2 oz (103 kg)  ?03/15/21 229 lb 3.2 oz (104 kg)  ?04/25/20 245 lb (111.1 kg)  ? ? ?Physical Exam ?Vitals and nursing note reviewed.  ?Constitutional:   ?   Appearance: She is well-developed.  ?HENT:  ?   Head: Normocephalic and atraumatic.  ?Cardiovascular:  ?   Rate and Rhythm: Normal rate and regular rhythm.  ?   Heart sounds: Normal heart sounds. No murmur heard. ?  No friction rub. No gallop.  ?Pulmonary:  ?   Effort: Pulmonary effort is normal. No tachypnea or respiratory distress.  ?   Breath sounds: Normal breath sounds. No decreased breath sounds, wheezing, rhonchi or rales.  ?Chest:  ?   Chest wall: No tenderness.  ?Abdominal:  ?   General: Bowel sounds are normal.  ?   Palpations: Abdomen is soft.  ?Musculoskeletal:     ?   General: Normal range of motion.  ?   Cervical back: Normal range of motion.  ?Skin: ?   General: Skin is warm and dry.  ?Neurological:  ?   Mental Status: She is alert and oriented to person, place, and time.  ?   Coordination: Coordination normal.  ?Psychiatric:     ?   Behavior: Behavior normal. Behavior is cooperative.     ?   Thought Content: Thought content normal.     ?   Judgment: Judgment normal.  ? ? ? ? ?   ?Patient has been counseled extensively about nutrition and exercise as well as the importance of adherence with medications and regular follow-up. The patient was given clear instructions to go to ER or return to medical center if symptoms don't improve, worsen or new problems develop. The patient verbalized understanding.   ? ?Follow-up: Return in about 3 months (around 01/09/2022).  ? ?Gildardo Pounds, FNP-BC ?[place] ?[place], [place] ?[phone]   ?10/09/2021, 8:16 PM ?

## 2021-10-10 ENCOUNTER — Other Ambulatory Visit: Payer: Self-pay

## 2021-10-10 ENCOUNTER — Other Ambulatory Visit: Payer: Self-pay | Admitting: Nurse Practitioner

## 2021-10-10 DIAGNOSIS — N183 Chronic kidney disease, stage 3 unspecified: Secondary | ICD-10-CM

## 2021-10-10 DIAGNOSIS — R799 Abnormal finding of blood chemistry, unspecified: Secondary | ICD-10-CM

## 2021-10-10 LAB — CBC WITH DIFFERENTIAL/PLATELET
Basophils Absolute: 0.1 10*3/uL (ref 0.0–0.2)
Basos: 1 %
EOS (ABSOLUTE): 0.2 10*3/uL (ref 0.0–0.4)
Eos: 3 %
Hematocrit: 38.8 % (ref 34.0–46.6)
Hemoglobin: 12.7 g/dL (ref 11.1–15.9)
Immature Grans (Abs): 0 10*3/uL (ref 0.0–0.1)
Immature Granulocytes: 0 %
Lymphocytes Absolute: 2 10*3/uL (ref 0.7–3.1)
Lymphs: 21 %
MCH: 29.1 pg (ref 26.6–33.0)
MCHC: 32.7 g/dL (ref 31.5–35.7)
MCV: 89 fL (ref 79–97)
Monocytes Absolute: 0.7 10*3/uL (ref 0.1–0.9)
Monocytes: 7 %
Neutrophils Absolute: 6.6 10*3/uL (ref 1.4–7.0)
Neutrophils: 68 %
Platelets: 259 10*3/uL (ref 150–450)
RBC: 4.36 x10E6/uL (ref 3.77–5.28)
RDW: 14.6 % (ref 11.7–15.4)
WBC: 9.5 10*3/uL (ref 3.4–10.8)

## 2021-10-10 LAB — LIPID PANEL
Chol/HDL Ratio: 4.4 ratio (ref 0.0–4.4)
Cholesterol, Total: 190 mg/dL (ref 100–199)
HDL: 43 mg/dL (ref >39)
LDL Chol Calc (NIH): 104 mg/dL — ABNORMAL HIGH (ref 0–99)
Triglycerides: 251 mg/dL — ABNORMAL HIGH (ref 0–149)
VLDL Cholesterol Cal: 43 mg/dL — ABNORMAL HIGH (ref 5–40)

## 2021-10-10 LAB — CMP14+EGFR
ALT: 15 IU/L (ref 0–32)
AST: 18 IU/L (ref 0–40)
Albumin/Globulin Ratio: 1.8 (ref 1.2–2.2)
Albumin: 4.3 g/dL (ref 3.8–4.8)
Alkaline Phosphatase: 66 IU/L (ref 44–121)
BUN/Creatinine Ratio: 41 — ABNORMAL HIGH (ref 12–28)
BUN: 78 mg/dL (ref 8–27)
Bilirubin Total: <0.2 mg/dL (ref 0.0–1.2)
CO2: 17 mmol/L — ABNORMAL LOW (ref 20–29)
Calcium: 9.7 mg/dL (ref 8.7–10.3)
Chloride: 107 mmol/L — ABNORMAL HIGH (ref 96–106)
Creatinine, Ser: 1.92 mg/dL — ABNORMAL HIGH (ref 0.57–1.00)
Globulin, Total: 2.4 g/dL (ref 1.5–4.5)
Glucose: 119 mg/dL — ABNORMAL HIGH (ref 70–99)
Potassium: 4.8 mmol/L (ref 3.5–5.2)
Sodium: 139 mmol/L (ref 134–144)
Total Protein: 6.7 g/dL (ref 6.0–8.5)
eGFR: 29 mL/min/{1.73_m2} — ABNORMAL LOW (ref >59)

## 2021-10-18 ENCOUNTER — Telehealth: Payer: Self-pay | Admitting: Nurse Practitioner

## 2021-10-18 NOTE — Telephone Encounter (Signed)
Pt received a call from [employer] and her glimepiride (AMARYL) 4 MG tablet was recalled due to CGMP DEVIATIONS/ pt would like to know what she needs to do / please advise  ?

## 2021-10-18 NOTE — Telephone Encounter (Signed)
Kristy Flores,  ? ?Have y'all gotten any calls about a glimepiride recall? ?

## 2021-10-21 ENCOUNTER — Other Ambulatory Visit: Payer: Self-pay

## 2021-10-24 ENCOUNTER — Other Ambulatory Visit: Payer: Self-pay | Admitting: Nephrology

## 2021-10-24 DIAGNOSIS — I129 Hypertensive chronic kidney disease with stage 1 through stage 4 chronic kidney disease, or unspecified chronic kidney disease: Secondary | ICD-10-CM

## 2021-10-24 DIAGNOSIS — E1122 Type 2 diabetes mellitus with diabetic chronic kidney disease: Secondary | ICD-10-CM

## 2021-10-24 DIAGNOSIS — N179 Acute kidney failure, unspecified: Secondary | ICD-10-CM

## 2021-10-24 DIAGNOSIS — N1832 Chronic kidney disease, stage 3b: Secondary | ICD-10-CM

## 2021-10-29 ENCOUNTER — Other Ambulatory Visit: Payer: Self-pay

## 2021-10-29 MED ORDER — SODIUM BICARBONATE 650 MG PO TABS
ORAL_TABLET | ORAL | 11 refills | Status: DC
  Filled 2021-10-29: qty 60, 30d supply, fill #0

## 2021-10-31 ENCOUNTER — Ambulatory Visit (INDEPENDENT_AMBULATORY_CARE_PROVIDER_SITE_OTHER): Payer: 59

## 2021-10-31 DIAGNOSIS — N179 Acute kidney failure, unspecified: Secondary | ICD-10-CM | POA: Diagnosis not present

## 2021-10-31 DIAGNOSIS — I129 Hypertensive chronic kidney disease with stage 1 through stage 4 chronic kidney disease, or unspecified chronic kidney disease: Secondary | ICD-10-CM | POA: Diagnosis not present

## 2021-10-31 DIAGNOSIS — N1832 Chronic kidney disease, stage 3b: Secondary | ICD-10-CM | POA: Diagnosis not present

## 2021-10-31 DIAGNOSIS — E1122 Type 2 diabetes mellitus with diabetic chronic kidney disease: Secondary | ICD-10-CM

## 2021-11-04 ENCOUNTER — Other Ambulatory Visit: Payer: Self-pay | Admitting: Nurse Practitioner

## 2021-11-04 ENCOUNTER — Other Ambulatory Visit: Payer: Self-pay

## 2021-11-04 DIAGNOSIS — G894 Chronic pain syndrome: Secondary | ICD-10-CM

## 2021-11-04 NOTE — Telephone Encounter (Signed)
Requested medication (s) are due for refill today: yes ? ?Requested medication (s) are on the active medication list: yes ? ?Last refill:  08/07/21 #180 with 0 RF ? ?Future visit scheduled: 01/10/22 ? ?Notes to clinic:  Creatinine has risen with each draw over the past year, please assess. ? ? ?  ? ?Requested Prescriptions  ?Pending Prescriptions Disp Refills  ? DULoxetine (CYMBALTA) 30 MG capsule 180 capsule 0  ?  Sig: Take 1 capsule (30 mg total) by mouth 2 (two) times daily.  ?  ? Psychiatry: Antidepressants - SNRI - duloxetine Failed - 11/04/2021  8:30 AM  ?  ?  Failed - Cr in normal range and within 360 days  ?  Creatinine, Ser  ?Date Value Ref Range Status  ?10/09/2021 1.92 (H) 0.57 - 1.00 mg/dL Final  ?  ?  ?  ?  Failed - eGFR is 30 or above and within 360 days  ?  GFR calc Af Amer  ?Date Value Ref Range Status  ?09/12/2020 39 (L) >59 mL/min/1.73 Final  ?  Comment:  ?  **In accordance with recommendations from the NKF-ASN Task force,** ?  [facility] is in the process of updating its eGFR calculation to the ?  2021 CKD-EPI creatinine equation that estimates kidney function ?  without a race variable. ?  ? ?GFR calc non Af Amer  ?Date Value Ref Range Status  ?09/12/2020 34 (L) >59 mL/min/1.73 Final  ? ?eGFR  ?Date Value Ref Range Status  ?10/09/2021 29 (L) >59 mL/min/1.73 Final  ?  ?  ?  ?  Passed - Completed PHQ-2 or PHQ-9 in the last 360 days  ?  ?  Passed - Last BP in normal range  ?  BP Readings from Last 1 Encounters:  ?10/09/21 108/68  ?  ?  ?  ?  Passed - Valid encounter within last 6 months  ?  Recent Outpatient Visits   ? ?      ? 3 weeks ago Controlled type 2 diabetes mellitus with hyperglycemia, with long-term current use of insulin ([place])  ? [place], [place] W, NP  ? 4 months ago Controlled type 2 diabetes mellitus with hyperglycemia, with long-term current use of insulin ([place])  ? [place], [place] W, NP  ? 7 months ago  Controlled type 2 diabetes mellitus with hyperglycemia, with long-term current use of insulin ([place])  ? [place], [place] W, NP  ? 9 months ago Controlled type 2 diabetes mellitus with hyperglycemia, with long-term current use of insulin ([place])  ? [place], [place] W, NP  ? 1 year ago Controlled type 2 diabetes mellitus with hyperglycemia, with long-term current use of insulin ([place])  ? [place] Gildardo Pounds, NP  ? ?  ?  ?Future Appointments   ? ?        ? In 2 months Gildardo Pounds, NP [place]  ? ?  ? ? ?  ?  ?  ? ? ?

## 2021-11-05 ENCOUNTER — Encounter: Payer: Self-pay | Admitting: Nurse Practitioner

## 2021-11-05 ENCOUNTER — Other Ambulatory Visit: Payer: Self-pay

## 2021-11-05 MED ORDER — DULOXETINE HCL 30 MG PO CPEP
30.0000 mg | ORAL_CAPSULE | Freq: Two times a day (BID) | ORAL | 2 refills | Status: DC
  Filled 2021-11-05: qty 60, 30d supply, fill #0
  Filled 2021-12-04: qty 60, 30d supply, fill #1
  Filled 2022-01-01: qty 60, 30d supply, fill #2

## 2021-11-06 ENCOUNTER — Other Ambulatory Visit: Payer: Self-pay | Admitting: Nephrology

## 2021-11-06 ENCOUNTER — Encounter: Payer: Self-pay | Admitting: Nurse Practitioner

## 2021-11-06 DIAGNOSIS — N2889 Other specified disorders of kidney and ureter: Secondary | ICD-10-CM

## 2021-11-12 ENCOUNTER — Ambulatory Visit: Payer: 59

## 2021-11-12 DIAGNOSIS — N2889 Other specified disorders of kidney and ureter: Secondary | ICD-10-CM

## 2021-11-12 MED ORDER — GADOBUTROL 1 MMOL/ML IV SOLN
10.0000 mL | Freq: Once | INTRAVENOUS | Status: AC | PRN
  Administered 2021-11-12: 10 mL via INTRAVENOUS

## 2021-11-13 ENCOUNTER — Telehealth: Payer: Self-pay

## 2021-11-13 NOTE — Telephone Encounter (Signed)
Attempted to contact pt to schedule appt. But vm was full. Message sent via [place].  ?

## 2021-11-13 NOTE — Telephone Encounter (Signed)
This patient must be seen by one of our physicians who performs ERCP [place], [place], [place], [place]) ? ?Record review indicates that this common bile duct stone was discovered incidentally on an MRI of the abdomen being done evaluate for renal lesions, and that most recent liver labs on 10/09/2021 were normal. ? ?Therefore, this patient can be seen at the next available new patient appointment with one of the above physicians. ? ?HD ?

## 2021-11-13 NOTE — Telephone Encounter (Signed)
Hi Dr. Loletha Carrow, we have received an urgent referral from [facility] for this pt for biliary ductal dilation and 4 mm stone in the distal common bile duct. Records will be sent to you for review and advise on scheduling. Thank you.  ?

## 2021-11-15 ENCOUNTER — Encounter: Payer: Self-pay | Admitting: Gastroenterology

## 2021-12-04 ENCOUNTER — Other Ambulatory Visit: Payer: Self-pay

## 2021-12-10 ENCOUNTER — Ambulatory Visit: Payer: 59 | Admitting: Gastroenterology

## 2021-12-10 ENCOUNTER — Encounter: Payer: Self-pay | Admitting: Gastroenterology

## 2021-12-10 ENCOUNTER — Other Ambulatory Visit (INDEPENDENT_AMBULATORY_CARE_PROVIDER_SITE_OTHER): Payer: 59

## 2021-12-10 DIAGNOSIS — K8020 Calculus of gallbladder without cholecystitis without obstruction: Secondary | ICD-10-CM

## 2021-12-10 DIAGNOSIS — K8050 Calculus of bile duct without cholangitis or cholecystitis without obstruction: Secondary | ICD-10-CM | POA: Diagnosis not present

## 2021-12-10 DIAGNOSIS — E559 Vitamin D deficiency, unspecified: Secondary | ICD-10-CM

## 2021-12-10 LAB — COMPREHENSIVE METABOLIC PANEL
ALT: 15 U/L (ref 0–35)
AST: 13 U/L (ref 0–37)
Albumin: 3.9 g/dL (ref 3.5–5.2)
Alkaline Phosphatase: 57 U/L (ref 39–117)
BUN: 45 mg/dL — ABNORMAL HIGH (ref 6–23)
CO2: 26 mEq/L (ref 19–32)
Calcium: 9.9 mg/dL (ref 8.4–10.5)
Chloride: 105 mEq/L (ref 96–112)
Creatinine, Ser: 1.48 mg/dL — ABNORMAL HIGH (ref 0.40–1.20)
GFR: 37.23 mL/min — ABNORMAL LOW (ref >60.00)
Glucose, Bld: 190 mg/dL — ABNORMAL HIGH (ref 70–99)
Potassium: 4 mEq/L (ref 3.5–5.1)
Sodium: 139 mEq/L (ref 135–145)
Total Bilirubin: 0.3 mg/dL (ref 0.2–1.2)
Total Protein: 6.9 g/dL (ref 6.0–8.3)

## 2021-12-10 NOTE — Patient Instructions (Addendum)
If you are age 64 or younger, your body mass index should be between 19-25. Your Body mass index is 41.52 kg/m. If this is out of the aformentioned range listed, please consider follow up with your Primary Care Provider.  ________________________________________________________  The [facility] providers would like to encourage you to use [facility] to communicate with providers for non-urgent requests or questions.  Due to long hold times on the telephone, sending your provider a message by [facility] may be a faster and more efficient way to get a response.  Please allow 48 business hours for a response.  Please remember that this is for non-urgent requests.  _______________________________________________________  Your provider has requested that you go to the basement level for lab work before leaving today. Press "B" on the elevator. The lab is located at the first door on the left as you exit the elevator.  Due to recent changes in healthcare laws, you may see the results of your imaging and laboratory studies on MyChart before your provider has had a chance to review them.  We understand that in some cases there may be results that are confusing or concerning to you. Not all laboratory results come back in the same time frame and the provider may be waiting for multiple results in order to interpret others.  Please give [place] 48 hours in order for your provider to thoroughly review all the results before contacting the office for clarification of your results.   You have been scheduled for an ERCP. Please follow written instructions given to you at your visit today. If you use inhalers (even only as needed), please bring them with you on the day of your procedure.  You have been scheduled for an appointment with _______ at [facility]. Your appointment is on _______ at ______. Please arrive at _______ for registration. Make certain to bring a list of current medications, including any over  the counter medications or vitamins. Also bring your co-pay if you have one as well as your insurance cards. [facility] is located at [address], [address]. Should you need to reschedule your appointment, please contact them at [phone].  Thank you for entrusting me with your care and choosing [facility].  Dr Ardis Hughs

## 2021-12-10 NOTE — Progress Notes (Signed)
HPI: This is a very pleasant 66 year old woman who was referred to me by Gildardo Pounds, NP  to evaluate common bile duct stone, cholelithiasis.    She has chronic kidney disease and was getting ultrasound of her kidneys.  The ultrasound suggest that she might have tumors on her kidneys and so she underwent a follow-up MRI.  I reviewed the MRI report as well as the MRI images themselves and there is clearly mild bile duct dilation with a clear cuboidal bile duct stone in the mid to distal common bile duct.  Her liver tests 2 months ago are all completely normal  She had never known that she had gallstones before.  She does have intermittent epigastric pains after eating certain kinds of foods last for up to 2 or 3 days.  This happens 2-3 times year when she does have this she is quite nauseous but she does not have vomiting.  She has never been the emergency room for this or sought medical attention for it.  Review of systems: Pertinent positive and negative review of systems were noted in the above HPI section. All other review negative.   Past Medical History:  Diagnosis Date   Depression    Diabetes mellitus without complication ([place])    Hyperlipidemia    Hypertension    Insomnia    Restless leg syndrome     Past Surgical History:  Procedure Laterality Date   ABDOMINAL HYSTERECTOMY     CESAREAN SECTION     MELANOMA EXCISION  1985    Current Outpatient Medications  Medication Instructions   allopurinol (ZYLOPRIM) 200 mg, Oral, Daily   Blood Glucose Monitoring Suppl (TRUE METRIX METER) w/Device KIT Use as instructed. Check blood glucose level by fingerstick twice per day.   DULoxetine (CYMBALTA) 30 mg, Oral, 2 times daily   furosemide (LASIX) 20 mg, Oral, Every other day   glimepiride (AMARYL) 4 mg, Oral, Daily before breakfast   glucose blood (TRUE METRIX BLOOD GLUCOSE TEST) test strip Use as instructed   Insulin Pen Needle (UNIFINE PENTIPS) 31G X 8 MM MISC Use once nightly  as directed   lisinopril (ZESTRIL) 5 mg, Oral, Daily, Please fill as a 90 day supply   lovastatin (MEVACOR) 40 mg, Oral, Daily at bedtime   TRUEplus Lancets 28G MISC Use as instructed. Check blood glucose level by fingerstick twice per day.   Vitamin D (Ergocalciferol) (DRISDOL) 50,000 Units, Oral, Every 7 days    Allergies as of 12/10/2021 - Review Complete 12/10/2021  Allergen Reaction Noted   Codeine  09/29/2018   Gabapentin  09/29/2018   Nsaids Other (See Comments) 11/06/2021   Penicillins  09/29/2018   Victoza [liraglutide]  09/29/2018    Family History  Problem Relation Age of Onset   Kidney disease Mother    Diabetes Mother    Hypertension Mother    Cancer Father     Social History   Socioeconomic History   Marital status: Divorced    Spouse name: Not on file   Number of children: 3   Years of education: Not on file   Highest education level: Not on file  Occupational History   Not on file  Tobacco Use   Smoking status: Never   Smokeless tobacco: Never  Vaping Use   Vaping Use: Never used  Substance and Sexual Activity   Alcohol use: Not Currently   Drug use: Not Currently   Sexual activity: Not Currently  Other Topics Concern   Not on  file  Social History Narrative   Not on file   Social Determinants of Health   Financial Resource Strain: Not on file  Food Insecurity: Not on file  Transportation Needs: Not on file  Physical Activity: Not on file  Stress: Not on file  Social Connections: Not on file  Intimate Partner Violence: Not on file     Physical Exam: BP (!) 150/76   Pulse 100   Ht 5' 2"  (1.575 m)   Wt 227 lb (103 kg)   BMI 41.52 kg/m  Constitutional: generally well-appearing Psychiatric: alert and oriented x3 Eyes: extraocular movements intact Mouth: oral pharynx moist, no lesions Neck: supple no lymphadenopathy Cardiovascular: heart regular rate and rhythm Lungs: clear to auscultation bilaterally Abdomen: soft, nontender,  nondistended, no obvious ascites, no peritoneal signs, normal bowel sounds Extremities: no lower extremity edema bilaterally Skin: no lesions on visible extremities   Assessment and plan: 66 y.o. female with obesity, cholelithiasis, choledocholithiasis  She might be having symptoms from her gallstone disease however epigastric pain lasting 2 to 3 days is a bit out of proportion for symptomatic cholelithiasis.  Usually if gallstones cause pain for that long then cholecystitis would most likely be present and likely symptomatic enough to present to the emergency room for evaluation.  She does clearly have stones in her gallbladder and a single stone in her common bile duct on her MRI last month.  She has normal liver tests.  She is not jaundiced on exam.  I recommended ERCP in the next several weeks.  She understands the risks to ERCP including acute pancreatitis which occurs 6 to 8% of the time during ERCP.  Also perforation, bleeding.  I recommended we get her into see [facility] to consider elective cholecystectomy, likely sometime after her ERCP.  We will get repeat complete metabolic profile today.  She knows to be on the alert for chills, serious abdominal pain, fevers, jaundice and if she has any symptoms she should call our office immediately.  In that case would likely advise her to go to the emergency room for evaluation and possibly inpatient ERCP with biliary doc of the week  Please see the "Patient Instructions" section for addition details about the plan.   Owens Loffler, MD [facility] 12/10/2021, 1:37 PM  Cc: Gildardo Pounds, NP  Total time on date of encounter was 46  minutes (this included time spent preparing to see the patient reviewing records; obtaining and/or reviewing separately obtained history; performing a medically appropriate exam and/or evaluation; counseling and educating the patient and family if present; ordering medications, tests or  procedures if applicable; and documenting clinical information in the health record).

## 2021-12-20 ENCOUNTER — Other Ambulatory Visit: Payer: Self-pay | Admitting: Nurse Practitioner

## 2021-12-20 ENCOUNTER — Other Ambulatory Visit: Payer: Self-pay

## 2021-12-20 MED ORDER — FUROSEMIDE 20 MG PO TABS
20.0000 mg | ORAL_TABLET | ORAL | 0 refills | Status: DC
  Filled 2021-12-20: qty 15, 30d supply, fill #0
  Filled 2022-01-13: qty 15, 30d supply, fill #1

## 2021-12-26 ENCOUNTER — Other Ambulatory Visit: Payer: Self-pay | Admitting: Nurse Practitioner

## 2021-12-26 ENCOUNTER — Other Ambulatory Visit: Payer: Self-pay

## 2021-12-26 DIAGNOSIS — E1165 Type 2 diabetes mellitus with hyperglycemia: Secondary | ICD-10-CM

## 2021-12-26 MED ORDER — GLIMEPIRIDE 4 MG PO TABS
4.0000 mg | ORAL_TABLET | Freq: Every day | ORAL | 0 refills | Status: DC
  Filled 2021-12-26: qty 90, 90d supply, fill #0

## 2021-12-26 NOTE — Telephone Encounter (Signed)
Requested Prescriptions  Pending Prescriptions Disp Refills  . glimepiride (AMARYL) 4 MG tablet 90 tablet 0    Sig: Take 1 tablet (4 mg total) by mouth daily before breakfast.     Endocrinology:  Diabetes - Sulfonylureas Failed - 12/26/2021  9:27 AM      Failed - Cr in normal range and within 360 days    Creatinine, Ser  Date Value Ref Range Status  12/10/2021 1.48 (H) 0.40 - 1.20 mg/dL Final         Passed - HBA1C is between 0 and 7.9 and within 180 days    Hemoglobin A1C  Date Value Ref Range Status  10/09/2021 5.9 (A) 4.0 - 5.6 % Final   HbA1c, POC (controlled diabetic range)  Date Value Ref Range Status  03/15/2021 5.8 0.0 - 7.0 % Final         Passed - Valid encounter within last 6 months    Recent Outpatient Visits          2 months ago Controlled type 2 diabetes mellitus with hyperglycemia, with long-term current use of insulin ([place])   [place], [place] W, NP   6 months ago Controlled type 2 diabetes mellitus with hyperglycemia, with long-term current use of insulin [facility])   [place], [place] W, NP   9 months ago Controlled type 2 diabetes mellitus with hyperglycemia, with long-term current use of insulin [facility])   [place], [place] W, NP   11 months ago Controlled type 2 diabetes mellitus with hyperglycemia, with long-term current use of insulin [facility])   [place], [place] W, NP   1 year ago Controlled type 2 diabetes mellitus with hyperglycemia, with long-term current use of insulin [facility])   [place], Vernia Buff, NP      Future Appointments            In 2 weeks Gildardo Pounds, NP [place]

## 2022-01-01 ENCOUNTER — Other Ambulatory Visit: Payer: Self-pay

## 2022-01-10 ENCOUNTER — Other Ambulatory Visit: Payer: Self-pay

## 2022-01-10 ENCOUNTER — Ambulatory Visit: Payer: 59 | Attending: Nurse Practitioner | Admitting: Nurse Practitioner

## 2022-01-10 ENCOUNTER — Encounter: Payer: Self-pay | Admitting: Nurse Practitioner

## 2022-01-10 VITALS — BP 148/79 | HR 101 | Temp 98.4°F | Ht 62.0 in | Wt 226.8 lb

## 2022-01-10 DIAGNOSIS — E785 Hyperlipidemia, unspecified: Secondary | ICD-10-CM | POA: Diagnosis not present

## 2022-01-10 DIAGNOSIS — E1165 Type 2 diabetes mellitus with hyperglycemia: Secondary | ICD-10-CM | POA: Diagnosis not present

## 2022-01-10 DIAGNOSIS — Z794 Long term (current) use of insulin: Secondary | ICD-10-CM

## 2022-01-10 DIAGNOSIS — I10 Essential (primary) hypertension: Secondary | ICD-10-CM

## 2022-01-10 MED ORDER — BASAGLAR KWIKPEN 100 UNIT/ML ~~LOC~~ SOPN
10.0000 [IU] | PEN_INJECTOR | Freq: Every day | SUBCUTANEOUS | 0 refills | Status: DC
  Filled 2022-01-10: qty 3, 30d supply, fill #0
  Filled 2022-02-03: qty 3, 30d supply, fill #1
  Filled 2022-03-03: qty 3, 30d supply, fill #2

## 2022-01-10 MED ORDER — SEMGLEE 100 UNIT/ML ~~LOC~~ SOPN
PEN_INJECTOR | Freq: Every day | SUBCUTANEOUS | 0 refills | Status: DC
  Filled 2022-01-10: qty 3, 30d supply, fill #0

## 2022-01-10 NOTE — Progress Notes (Signed)
Assessment & Plan:  Kristy Flores was seen today for hypertension.  Diagnoses and all orders for this visit:   Controlled type 2 diabetes mellitus with hyperglycemia, with long-term current use of insulin (HCC) -     Hemoglobin A1c -     Microalbumin / creatinine urine ratio -     Insulin Glargine (BASAGLAR KWIKPEN) 100 UNIT/ML; Inject 10 Units into the skin at bedtime.  Dyslipidemia, goal LDL below 70 Continue lovastatin as prescribed  Elevated Blood pressure reading May need to start on carvedilol She has appt with nephrology next week.    Patient has been counseled on age-appropriate routine health concerns for screening and prevention. These are reviewed and up-to-date. Referrals have been placed accordingly. Immunizations are up-to-date or declined.    Subjective:   Chief Complaint  Patient presents with   Diabetes   HPI Kristy Flores 66 y.o. female presents to office today for follow up to DM.  She has a past medical history of Depression, DM2, Hyperlipidemia, elevated blood pressure, Insomnia,  CKD stage 3, and Restless leg syndrome  DM 2 Diabetes not controlled. A1c up from 5.9 to 7.5. She has not been using her insulin. Only taking glimepiride 4 mg daily.  Lab Results  Component Value Date   HGBA1C 7.5 (H) 01/10/2022      She was taken off lisinopril 5mg  by her nephrologist but can not recall why. Blood pressure is elevated today. Will recheck at next  visit if continues elevated will need to start beta blocker.  BP Readings from Last 3 Encounters:  01/10/22 (!) 148/79  12/10/21 (!) 150/76  10/09/21 108/68     Dyslipidemia LDL not at goal. She has not picked up her lovastatin from the pharmacy in quite some time.    Lab Results  Component Value Date   LDLCALC 104 (H) 10/09/2021     Lab Results  Component Value Date   HGBA1C 7.5 (H) 01/10/2022     Kristy Flores   Blood glucose levels 180-240 psot prandial.  ROS  Past Medical History:  Diagnosis Date    Depression    Diabetes mellitus without complication (HCC)    Hyperlipidemia    Hypertension    Insomnia    Restless leg syndrome     Past Surgical History:  Procedure Laterality Date   ABDOMINAL HYSTERECTOMY     CESAREAN SECTION     MELANOMA EXCISION  1985    Family History  Problem Relation Age of Onset   Kidney disease Mother    Diabetes Mother    Hypertension Mother    Cancer Father     Social History Reviewed with no changes to be made today.   Outpatient Medications Prior to Visit  Medication Sig Dispense Refill   allopurinol (ZYLOPRIM) 100 MG tablet Take 2 tablets (200 mg total) by mouth daily. 180 tablet 1   Blood Glucose Monitoring Suppl (TRUE METRIX METER) w/Device KIT Use as instructed. Check blood glucose level by fingerstick twice per day. 1 kit 0   DULoxetine (CYMBALTA) 30 MG capsule Take 1 capsule (30 mg total) by mouth 2 (two) times daily. 60 capsule 2   furosemide (LASIX) 20 MG tablet Take 1 tablet (20 mg total) by mouth every other day. (Must keep upcoming office visit for refills) 30 tablet 0   glimepiride (AMARYL) 4 MG tablet Take 1 tablet (4 mg total) by mouth daily before breakfast. 90 tablet 0   glucose blood (TRUE METRIX BLOOD GLUCOSE TEST) test strip  Use as instructed 100 each 12   lovastatin (MEVACOR) 40 MG tablet Take 1 tablet (40 mg total) by mouth at bedtime. 90 tablet 2   TRUEplus Lancets 28G MISC Use as instructed. Check blood glucose level by fingerstick twice per day. 100 each 3   Vitamin D, Ergocalciferol, (DRISDOL) 1.25 MG (50000 UNIT) CAPS capsule Take 1 capsule (50,000 Units total) by mouth every 7 (seven) days. 12 capsule 1   lisinopril (ZESTRIL) 5 MG tablet Take 1 tablet (5 mg total) by mouth daily. Please fill as a 90 day supply 90 tablet 1   Insulin Pen Needle (UNIFINE PENTIPS) 31G X 8 MM MISC Use once nightly as directed (Patient not taking: Reported on 01/10/2022) 100 each 1   No facility-administered medications prior to visit.     Allergies  Allergen Reactions   Codeine     Vomit   Gabapentin     AKI   Nsaids Other (See Comments)    CKD   Penicillins     Broke out, stomach upset.    Victoza [Liraglutide]     creatinine       Objective:    BP (!) 148/79   Pulse (!) 101   Temp 98.4 F (36.9 C) (Oral)   Ht 5\' 2"  (1.575 m)   Wt 226 lb 12.8 oz (102.9 kg)   SpO2 95%   BMI 41.48 kg/m  Wt Readings from Last 3 Encounters:  01/10/22 226 lb 12.8 oz (102.9 kg)  12/10/21 227 lb (103 kg)  10/09/21 227 lb 2 oz (103 kg)    Physical Exam       Patient has been counseled extensively about nutrition and exercise as well as the importance of adherence with medications and regular follow-up. The patient was given clear instructions to go to ER or return to medical center if symptoms don't improve, worsen or new problems develop. The patient verbalized understanding.   Follow-up: Return in about 3 months (around 04/12/2022).   Claiborne Rigg, FNP-BC [facility] and [facility] [place], [place] [phone]   01/12/2022, 8:16 PM

## 2022-01-12 LAB — HEMOGLOBIN A1C
Est. average glucose Bld gHb Est-mCnc: 169 mg/dL
Hgb A1c MFr Bld: 7.5 % — ABNORMAL HIGH (ref 4.8–5.6)

## 2022-01-12 LAB — MICROALBUMIN / CREATININE URINE RATIO
Creatinine, Urine: 102.4 mg/dL
Microalb/Creat Ratio: 1499 mg/g creat — ABNORMAL HIGH (ref 0–29)
Microalbumin, Urine: 1534.8 ug/mL

## 2022-01-12 MED ORDER — CARVEDILOL 6.25 MG PO TABS
6.2500 mg | ORAL_TABLET | Freq: Two times a day (BID) | ORAL | 3 refills | Status: DC
  Filled 2022-01-12: qty 60, 30d supply, fill #0

## 2022-01-12 MED ORDER — LOVASTATIN 40 MG PO TABS
40.0000 mg | ORAL_TABLET | Freq: Every day | ORAL | 2 refills | Status: DC
  Filled 2022-01-12: qty 90, 90d supply, fill #0
  Filled 2022-03-25: qty 30, 30d supply, fill #0
  Filled 2022-04-21: qty 30, 30d supply, fill #1
  Filled 2022-06-02: qty 30, 30d supply, fill #2
  Filled 2022-07-03: qty 30, 30d supply, fill #3
  Filled 2022-08-04: qty 30, 30d supply, fill #4
  Filled 2022-09-05: qty 30, 30d supply, fill #5
  Filled 2022-10-01: qty 30, 30d supply, fill #6

## 2022-01-13 ENCOUNTER — Other Ambulatory Visit: Payer: Self-pay

## 2022-01-13 MED ORDER — SODIUM BICARBONATE 650 MG PO TABS
650.0000 mg | ORAL_TABLET | Freq: Two times a day (BID) | ORAL | 11 refills | Status: DC
  Filled 2022-01-13: qty 60, 30d supply, fill #0
  Filled 2022-07-15: qty 60, 30d supply, fill #1
  Filled 2022-09-11 (×2): qty 60, 30d supply, fill #2
  Filled 2022-11-19: qty 60, 30d supply, fill #3

## 2022-02-03 ENCOUNTER — Other Ambulatory Visit: Payer: Self-pay | Admitting: Family Medicine

## 2022-02-03 ENCOUNTER — Other Ambulatory Visit: Payer: Self-pay

## 2022-02-03 DIAGNOSIS — G894 Chronic pain syndrome: Secondary | ICD-10-CM

## 2022-02-03 MED ORDER — DULOXETINE HCL 30 MG PO CPEP
30.0000 mg | ORAL_CAPSULE | Freq: Two times a day (BID) | ORAL | 2 refills | Status: DC
  Filled 2022-02-03: qty 60, 30d supply, fill #0
  Filled 2022-03-03: qty 60, 30d supply, fill #1
  Filled 2022-04-03: qty 60, 30d supply, fill #2

## 2022-02-04 ENCOUNTER — Other Ambulatory Visit: Payer: Self-pay

## 2022-02-04 MED ORDER — LISINOPRIL 5 MG PO TABS
ORAL_TABLET | ORAL | 3 refills | Status: DC
  Filled 2022-02-04: qty 30, 30d supply, fill #0

## 2022-02-04 MED ORDER — SODIUM BICARBONATE 650 MG PO TABS
ORAL_TABLET | ORAL | 11 refills | Status: DC
  Filled 2022-02-04: qty 30, 30d supply, fill #0

## 2022-02-06 ENCOUNTER — Encounter (HOSPITAL_COMMUNITY): Payer: Self-pay | Admitting: Gastroenterology

## 2022-02-13 ENCOUNTER — Ambulatory Visit (HOSPITAL_BASED_OUTPATIENT_CLINIC_OR_DEPARTMENT_OTHER): Payer: Commercial Managed Care - HMO | Admitting: Certified Registered Nurse Anesthetist

## 2022-02-13 ENCOUNTER — Other Ambulatory Visit: Payer: Self-pay

## 2022-02-13 ENCOUNTER — Ambulatory Visit (HOSPITAL_COMMUNITY): Payer: Commercial Managed Care - HMO

## 2022-02-13 ENCOUNTER — Ambulatory Visit (HOSPITAL_COMMUNITY): Payer: Commercial Managed Care - HMO | Admitting: Certified Registered Nurse Anesthetist

## 2022-02-13 ENCOUNTER — Ambulatory Visit (HOSPITAL_COMMUNITY)
Admission: RE | Admit: 2022-02-13 | Discharge: 2022-02-13 | Disposition: A | Discharge status: Home / Self Care | Payer: Commercial Managed Care - HMO | Attending: Gastroenterology | Admitting: Gastroenterology

## 2022-02-13 ENCOUNTER — Telehealth: Payer: Self-pay

## 2022-02-13 ENCOUNTER — Encounter (HOSPITAL_COMMUNITY)
Admission: RE | Disposition: A | Discharge status: Home / Self Care | Payer: Self-pay | Source: Home / Self Care | Attending: Gastroenterology

## 2022-02-13 ENCOUNTER — Encounter (HOSPITAL_COMMUNITY): Payer: Self-pay | Admitting: Gastroenterology

## 2022-02-13 DIAGNOSIS — K8050 Calculus of bile duct without cholangitis or cholecystitis without obstruction: Secondary | ICD-10-CM | POA: Diagnosis not present

## 2022-02-13 DIAGNOSIS — Z7984 Long term (current) use of oral hypoglycemic drugs: Secondary | ICD-10-CM | POA: Diagnosis not present

## 2022-02-13 DIAGNOSIS — I129 Hypertensive chronic kidney disease with stage 1 through stage 4 chronic kidney disease, or unspecified chronic kidney disease: Secondary | ICD-10-CM | POA: Diagnosis not present

## 2022-02-13 DIAGNOSIS — N189 Chronic kidney disease, unspecified: Secondary | ICD-10-CM | POA: Diagnosis not present

## 2022-02-13 DIAGNOSIS — E119 Type 2 diabetes mellitus without complications: Secondary | ICD-10-CM

## 2022-02-13 DIAGNOSIS — Z87891 Personal history of nicotine dependence: Secondary | ICD-10-CM

## 2022-02-13 DIAGNOSIS — E1122 Type 2 diabetes mellitus with diabetic chronic kidney disease: Secondary | ICD-10-CM | POA: Insufficient documentation

## 2022-02-13 DIAGNOSIS — Z6841 Body Mass Index (BMI) 40.0 and over, adult: Secondary | ICD-10-CM | POA: Insufficient documentation

## 2022-02-13 DIAGNOSIS — K8020 Calculus of gallbladder without cholecystitis without obstruction: Secondary | ICD-10-CM

## 2022-02-13 DIAGNOSIS — K8070 Calculus of gallbladder and bile duct without cholecystitis without obstruction: Secondary | ICD-10-CM | POA: Diagnosis present

## 2022-02-13 DIAGNOSIS — I10 Essential (primary) hypertension: Secondary | ICD-10-CM | POA: Diagnosis not present

## 2022-02-13 HISTORY — PX: REMOVAL OF STONES: SHX5545

## 2022-02-13 HISTORY — PX: ERCP: SHX5425

## 2022-02-13 HISTORY — PX: SPHINCTEROTOMY: SHX5544

## 2022-02-13 LAB — GLUCOSE, CAPILLARY: Glucose-Capillary: 113 mg/dL — ABNORMAL HIGH (ref 70–99)

## 2022-02-13 SURGERY — ERCP, WITH INTERVENTION IF INDICATED
Anesthesia: General

## 2022-02-13 MED ORDER — PROPOFOL 10 MG/ML IV BOLUS
INTRAVENOUS | Status: DC | PRN
  Administered 2022-02-13: 50 mg via INTRAVENOUS
  Administered 2022-02-13: 150 mg via INTRAVENOUS

## 2022-02-13 MED ORDER — FENTANYL CITRATE (PF) 100 MCG/2ML IJ SOLN
INTRAMUSCULAR | Status: AC
  Filled 2022-02-13: qty 2

## 2022-02-13 MED ORDER — CIPROFLOXACIN IN D5W 400 MG/200ML IV SOLN
INTRAVENOUS | Status: AC
  Filled 2022-02-13: qty 200

## 2022-02-13 MED ORDER — ONDANSETRON HCL 4 MG/2ML IJ SOLN
INTRAMUSCULAR | Status: DC | PRN
  Administered 2022-02-13: 4 mg via INTRAVENOUS

## 2022-02-13 MED ORDER — LACTATED RINGERS IV SOLN: INTRAVENOUS | Status: DC

## 2022-02-13 MED ORDER — FENTANYL CITRATE (PF) 100 MCG/2ML IJ SOLN
INTRAMUSCULAR | Status: DC | PRN
  Administered 2022-02-13: 50 ug via INTRAVENOUS

## 2022-02-13 MED ORDER — SUGAMMADEX SODIUM 500 MG/5ML IV SOLN
INTRAVENOUS | Status: DC | PRN
  Administered 2022-02-13: 300 mg via INTRAVENOUS

## 2022-02-13 MED ORDER — CIPROFLOXACIN IN D5W 400 MG/200ML IV SOLN
INTRAVENOUS | Status: DC | PRN
  Administered 2022-02-13: 400 mg via INTRAVENOUS

## 2022-02-13 MED ORDER — INDOMETHACIN 50 MG RE SUPP
RECTAL | Status: DC | PRN
  Administered 2022-02-13: 100 mg via RECTAL

## 2022-02-13 MED ORDER — DEXAMETHASONE SODIUM PHOSPHATE 10 MG/ML IJ SOLN
INTRAMUSCULAR | Status: DC | PRN
  Administered 2022-02-13: 8 mg via INTRAVENOUS

## 2022-02-13 MED ORDER — SODIUM CHLORIDE 0.9 % IV SOLN
INTRAVENOUS | Status: DC | PRN
  Administered 2022-02-13: 30 mL

## 2022-02-13 MED ORDER — SUCCINYLCHOLINE CHLORIDE 200 MG/10ML IV SOSY
PREFILLED_SYRINGE | INTRAVENOUS | Status: DC | PRN
  Administered 2022-02-13: 100 mg via INTRAVENOUS

## 2022-02-13 MED ORDER — LIDOCAINE 2% (20 MG/ML) 5 ML SYRINGE
INTRAMUSCULAR | Status: DC | PRN
  Administered 2022-02-13: 60 mg via INTRAVENOUS

## 2022-02-13 MED ORDER — EPHEDRINE SULFATE-NACL 50-0.9 MG/10ML-% IV SOSY
PREFILLED_SYRINGE | INTRAVENOUS | Status: DC | PRN
  Administered 2022-02-13: 15 mg via INTRAVENOUS

## 2022-02-13 MED ORDER — GLUCAGON HCL RDNA (DIAGNOSTIC) 1 MG IJ SOLR
INTRAMUSCULAR | Status: DC | PRN
  Administered 2022-02-13: .5 mg via INTRAVENOUS

## 2022-02-13 MED ORDER — ROCURONIUM BROMIDE 10 MG/ML (PF) SYRINGE
PREFILLED_SYRINGE | INTRAVENOUS | Status: DC | PRN
  Administered 2022-02-13: 50 mg via INTRAVENOUS

## 2022-02-13 MED ORDER — GLUCAGON HCL RDNA (DIAGNOSTIC) 1 MG IJ SOLR
INTRAMUSCULAR | Status: AC
  Filled 2022-02-13: qty 1

## 2022-02-13 MED ORDER — DICLOFENAC SUPPOSITORY 100 MG
RECTAL | Status: AC
  Filled 2022-02-13: qty 1

## 2022-02-13 MED ORDER — SODIUM CHLORIDE 0.9 % IV SOLN: INTRAVENOUS | Status: DC

## 2022-02-13 MED ORDER — INDOMETHACIN 50 MG RE SUPP
RECTAL | Status: AC
  Filled 2022-02-13: qty 2

## 2022-02-13 NOTE — Telephone Encounter (Signed)
Attempted to reach patient by phone to discuss referral to [facility].  There was no answer and the voicemail box was full.    Patient is scheduled to see Dr Redmond Pulling on 03-05-22 at [facility].  Patient should arrive at 8:30am.  Will continue efforts.

## 2022-02-13 NOTE — Anesthesia Postprocedure Evaluation (Signed)
Anesthesia Post Note  Patient: Lyla Jasek  Procedure(s) Performed: ENDOSCOPIC RETROGRADE CHOLANGIOPANCREATOGRAPHY (ERCP) SPHINCTEROTOMY REMOVAL OF STONES     Patient location during evaluation: PACU Anesthesia Type: General Level of consciousness: awake and alert Pain management: pain level controlled Vital Signs Assessment: post-procedure vital signs reviewed and stable Respiratory status: spontaneous breathing, nonlabored ventilation, respiratory function stable and patient connected to nasal cannula oxygen Cardiovascular status: blood pressure returned to baseline and stable Postop Assessment: no apparent nausea or vomiting Anesthetic complications: no   No notable events documented.  Last Vitals:  Vitals:   02/13/22 1100 02/13/22 1110  BP: (!) 154/60 (!) 138/38  Pulse: 80 75  Resp: 20 (!) 21  Temp:    SpO2: 91% 96%    Last Pain:  Vitals:   02/13/22 1110  TempSrc:   PainSc: 0-No pain                 Tiajuana Amass

## 2022-02-13 NOTE — H&P (Signed)
HPI: This is a woman with gallstones in GB and CBD  ROS: complete GI ROS as described in HPI, all other review negative.  Constitutional:  No unintentional weight loss   Past Medical History:  Diagnosis Date   Depression    Diabetes mellitus without complication ([place])    Hyperlipidemia    Hypertension    Insomnia    Restless leg syndrome     Past Surgical History:  Procedure Laterality Date   ABDOMINAL HYSTERECTOMY     CESAREAN SECTION     MELANOMA EXCISION  1985    Current Outpatient Medications  Medication Instructions   acetaminophen (TYLENOL) 1,000 mg, Oral, Every 6 hours PRN   allopurinol (ZYLOPRIM) 200 mg, Oral, Daily   Basaglar KwikPen 10 Units, Subcutaneous, Daily at bedtime   Blood Glucose Monitoring Suppl (TRUE METRIX METER) w/Device KIT Use as instructed. Check blood glucose level by fingerstick twice per day.   DULoxetine (CYMBALTA) 30 mg, Oral, 2 times daily   furosemide (LASIX) 20 MG tablet Take 1 tablet (20 mg total) by mouth every other day. (Must keep upcoming office visit for refills)   glimepiride (AMARYL) 4 mg, Oral, Daily before breakfast   glucose blood (TRUE METRIX BLOOD GLUCOSE TEST) test strip Use as instructed   lisinopril (ZESTRIL) 5 MG tablet TAKE 1 TABLET BY MOUTH DAILY   lisinopril (ZESTRIL) 5 mg, Oral, Daily, Please fill as a 90 day supply   lovastatin (MEVACOR) 40 mg, Oral, Daily at bedtime   sodium bicarbonate 650 MG tablet TAKE 1 TABLET BY MOUTH DAILY   sodium bicarbonate 650 mg, Oral, 2 times daily   TRUEplus Lancets 28G MISC Use as instructed. Check blood glucose level by fingerstick twice per day.   Vitamin D (Ergocalciferol) (DRISDOL) 50,000 Units, Oral, Every 7 days    Allergies as of 12/10/2021 - Review Complete 12/10/2021  Allergen Reaction Noted   Codeine  09/29/2018   Gabapentin  09/29/2018   Nsaids Other (See Comments) 11/06/2021   Penicillins  09/29/2018   Victoza [liraglutide]  09/29/2018    Family History  Problem  Relation Age of Onset   Kidney disease Mother    Diabetes Mother    Hypertension Mother    Cancer Father     Social History   Socioeconomic History   Marital status: Divorced    Spouse name: Not on file   Number of children: 3   Years of education: Not on file   Highest education level: Not on file  Occupational History   Not on file  Tobacco Use   Smoking status: Never   Smokeless tobacco: Never  Vaping Use   Vaping Use: Never used  Substance and Sexual Activity   Alcohol use: Not Currently   Drug use: Not Currently   Sexual activity: Not Currently  Other Topics Concern   Not on file  Social History Narrative   Not on file   Social Determinants of Health   Financial Resource Strain: Not on file  Food Insecurity: Not on file  Transportation Needs: Not on file  Physical Activity: Not on file  Stress: Not on file  Social Connections: Not on file  Intimate Partner Violence: Not on file     Physical Exam:  Constitutional: generally well-appearing Psychiatric: alert and oriented x3 Abdomen: soft, nontender, nondistended, no obvious ascites, no peritoneal signs, normal bowel sounds No peripheral edema noted in lower extremities  Assessment and plan: 66 y.o. female with gallstone disease  Stones in GB and CBD,  ERCP today.  Please see the "Patient Instructions" section for addition details about the plan.  Owens Loffler, MD [facility] 02/13/2022, 9:24 AM

## 2022-02-13 NOTE — Transfer of Care (Signed)
Immediate Anesthesia Transfer of Care Note  Patient: Kristy Flores  Procedure(s) Performed: ENDOSCOPIC RETROGRADE CHOLANGIOPANCREATOGRAPHY (ERCP) SPHINCTEROTOMY REMOVAL OF STONES  Patient Location: PACU and Endoscopy Unit  Anesthesia Type:General  Level of Consciousness: awake and patient cooperative  Airway & Oxygen Therapy: Patient Spontanous Breathing and Patient connected to face mask oxygen  Post-op Assessment: Report given to RN and Post -op Vital signs reviewed and stable  Post vital signs: Reviewed and stable  Last Vitals:  Vitals Value Taken Time  BP    Temp    Pulse 84 02/13/22 1054  Resp 23 02/13/22 1054  SpO2 93 % 02/13/22 1054  Vitals shown include unvalidated device data.  Last Pain:  Vitals:   02/13/22 0933  TempSrc: Temporal  PainSc: 0-No pain         Complications: No notable events documented.

## 2022-02-13 NOTE — Anesthesia Procedure Notes (Signed)
Procedure Name: Intubation Date/Time: 02/13/2022 10:06 AM  Performed by: West Pugh, CRNAPre-anesthesia Checklist: Patient identified, Emergency Drugs available, Suction available, Patient being monitored and Timeout performed Patient Re-evaluated:Patient Re-evaluated prior to induction Oxygen Delivery Method: Circle system utilized Preoxygenation: Pre-oxygenation with 100% oxygen Induction Type: IV induction Ventilation: Mask ventilation without difficulty Laryngoscope Size: 3 and Glidescope Grade View: Grade I Tube type: Oral Tube size: 7.0 mm Number of attempts: 1 Airway Equipment and Method: Rigid stylet and Video-laryngoscopy Placement Confirmation: ETT inserted through vocal cords under direct vision, positive ETCO2, CO2 detector and breath sounds checked- equal and bilateral Secured at: 22 cm Tube secured with: Tape Dental Injury: Teeth and Oropharynx as per pre-operative assessment  Difficulty Due To: Difficult Airway- due to reduced neck mobility Comments: DL x 1 with MAC #3 Grade 3 with BURP, no attempt made. Glidescope utilized with lopro #3 and grade 1 view with successful intubation.

## 2022-02-13 NOTE — Telephone Encounter (Signed)
-----   Message from Milus Banister, MD sent at 02/13/2022 10:42 AM EDT ----- Just compelted her ERCP. She needs referral to [facility] to consider elective cholecystectomy.  Thanks

## 2022-02-13 NOTE — Discharge Instructions (Signed)
YOU HAD AN ENDOSCOPIC PROCEDURE TODAY: Refer to the procedure report and other information in the discharge instructions given to you for any specific questions about what was found during the examination. If this information does not answer your questions, please call [place] office at [phone] to clarify.   YOU SHOULD EXPECT: Some feelings of bloating in the abdomen. Passage of more gas than usual. Walking can help get rid of the air that was put into your GI tract during the procedure and reduce the bloating. If you had a lower endoscopy (such as a colonoscopy or flexible sigmoidoscopy) you may notice spotting of blood in your stool or on the toilet paper. Some abdominal soreness may be present for a day or two, also.  DIET: Your first meal following the procedure should be a light meal and then it is ok to progress to your normal diet. A half-sandwich or bowl of soup is an example of a good first meal. Heavy or fried foods are harder to digest and may make you feel nauseous or bloated. Drink plenty of fluids but you should avoid alcoholic beverages for 24 hours. If you had a esophageal dilation, please see attached instructions for diet.    ACTIVITY: Your care partner should take you home directly after the procedure. You should plan to take it easy, moving slowly for the rest of the day. You can resume normal activity the day after the procedure however YOU SHOULD NOT DRIVE, use power tools, machinery or perform tasks that involve climbing or major physical exertion for 24 hours (because of the sedation medicines used during the test).   SYMPTOMS TO REPORT IMMEDIATELY: A gastroenterologist can be reached at any hour. Please call [phone]  for any of the following symptoms:   Following upper endoscopy (EGD, EUS, ERCP, esophageal dilation) Vomiting of blood or coffee ground material  New, significant abdominal pain  New, significant chest pain or pain under the shoulder blades  Painful or  persistently difficult swallowing  New shortness of breath  Black, tarry-looking or red, bloody stools  FOLLOW UP:  If any biopsies were taken you will be contacted by phone or by letter within the next 1-3 weeks. Call [phone]  if you have not heard about the biopsies in 3 weeks.  Please also call with any specific questions about appointments or follow up tests.  

## 2022-02-13 NOTE — Op Note (Signed)
[facility] Patient Name: Kristy Flores Procedure Date: 02/13/2022 MRN: [id number] Attending MD: Milus Banister , MD Date of Birth: [birth year] CSN: [id number] Age: 66 Admit Type: Outpatient Procedure:                ERCP Indications:              Cholelithiasis and choledocholithiasis on recent MR Providers:                Milus Banister, MD, Carlyn Reichert, RN, Janee Morn, Technician, Christell Faith, CRNA Referring MD:             Milus Banister, MD Medicines:                General Anesthesia, Cipro 400 mg IV, Indomethacin                            762 mg PR Complications:            No immediate complications. Estimated blood loss:                            None Estimated Blood Loss:     Estimated blood loss: none. Procedure:                Pre-Anesthesia Assessment:                           - Prior to the procedure, a History and Physical                            was performed, and patient medications and                            allergies were reviewed. The patient's tolerance of                            previous anesthesia was also reviewed. The risks                            and benefits of the procedure and the sedation                            options and risks were discussed with the patient.                            All questions were answered, and informed consent                            was obtained. Prior Anticoagulants: The patient has                            taken no previous anticoagulant or antiplatelet  agents. ASA Grade Assessment: II - A patient with                            mild systemic disease. After reviewing the risks                            and benefits, the patient was deemed in                            satisfactory condition to undergo the procedure.                           After obtaining informed consent, the scope was                             passed under direct vision. Throughout the                            procedure, the patient's blood pressure, pulse, and                            oxygen saturations were monitored continuously. The                            [id number]-Q190V ([id number]) Olympus duodenoscope was                            introduced through the mouth, and used to inject                            contrast into and used to cannulate the bile duct.                            The ERCP was accomplished without difficulty. The                            patient tolerated the procedure well. Scope In: Scope Out: Findings:      Scout film was normal. The duodenoscope was advanced to the region of       the major papilla without detailed examination of the upper GI tract.       The major papilla was normal and there was some yellow bile present. I       used a 63 autotome over a .035 Hydra wire to cannulate the bile duct and       then injected contrast. Cholangiogram showed a diffusely slightly       dilated extrahepatic biliary tree. The CBD was 8 or 9 mm. There was a       very obvious medium sized geometrically shaped mobile filling defect       within the bile duct. The cystic duct did not opacify. There were no       strictures. I performed an adequate biliary sphincterotomy over the wire       and then used a 9 to 12 mm biliary retrieval balloon to sweep the duct  several times. This led to delivery of a single white geometric       gallstone into the duodenum. Completion, occlusion cholangiogram was       normal. The main pancreatic duct was never cannulated with a wire or       injected with dye. Impression:               - Choledocholithiasis. This was treated with                            biliary sphincterotomy and balloon sweeping.                           - Cholelithiasis seen on recent MR Moderate Sedation:      Not Applicable - Patient had care per Anesthesia. Recommendation:           -  Discharge patient to home.                           -Dr. Ardis Hughs office will arrange referral to [facility] surgery to consider elective outpatient                            cholecystectomy. Procedure Code(s):        --- Professional ---                           [phone], Endoscopic retrograde                            cholangiopancreatography (ERCP); with                            sphincterotomy/papillotomy Diagnosis Code(s):        --- Professional ---                           K80.50, Calculus of bile duct without cholangitis                            or cholecystitis without obstruction CPT copyright 2019 American Medical Association. All rights reserved. The codes documented in this report are preliminary and upon coder review may  be revised to meet current compliance requirements. Milus Banister, MD 02/13/2022 10:40:41 AM This report has been signed electronically. Number of Addenda: 0

## 2022-02-13 NOTE — Anesthesia Preprocedure Evaluation (Signed)
Anesthesia Evaluation  Patient identified by MRN, date of birth, ID band Patient awake    Reviewed: Allergy & Precautions, NPO status , Patient's Chart, lab work & pertinent test results  Airway Mallampati: II  TM Distance: >3 FB Neck ROM: Full    Dental  (+) Dental Advisory Given   Pulmonary neg pulmonary ROS,    breath sounds clear to auscultation       Cardiovascular hypertension, Pt. on medications  Rhythm:Regular Rate:Normal     Neuro/Psych negative neurological ROS     GI/Hepatic negative GI ROS, Neg liver ROS,   Endo/Other  diabetes, Type 2, Oral Hypoglycemic AgentsMorbid obesity  Renal/GU CRFRenal disease     Musculoskeletal   Abdominal   Peds  Hematology negative hematology ROS (+)   Anesthesia Other Findings   Reproductive/Obstetrics                             Anesthesia Physical Anesthesia Plan  ASA: 3  Anesthesia Plan: General   Post-op Pain Management: Minimal or no pain anticipated   Induction: Intravenous  PONV Risk Score and Plan: 2 and Dexamethasone and Ondansetron  Airway Management Planned: Oral ETT  Additional Equipment: None  Intra-op Plan:   Post-operative Plan: Extubation in OR  Informed Consent: I have reviewed the patients History and Physical, chart, labs and discussed the procedure including the risks, benefits and alternatives for the proposed anesthesia with the patient or authorized representative who has indicated his/her understanding and acceptance.     Dental advisory given  Plan Discussed with: CRNA  Anesthesia Plan Comments:         Anesthesia Quick Evaluation

## 2022-02-14 ENCOUNTER — Other Ambulatory Visit: Payer: Self-pay

## 2022-02-14 ENCOUNTER — Other Ambulatory Visit: Payer: Self-pay | Admitting: Family Medicine

## 2022-02-14 ENCOUNTER — Other Ambulatory Visit: Payer: Self-pay | Admitting: Nurse Practitioner

## 2022-02-14 ENCOUNTER — Encounter (HOSPITAL_COMMUNITY): Payer: Self-pay | Admitting: Gastroenterology

## 2022-02-14 MED ORDER — FUROSEMIDE 20 MG PO TABS
20.0000 mg | ORAL_TABLET | ORAL | 2 refills | Status: DC
  Filled 2022-02-14: qty 15, 30d supply, fill #0
  Filled 2022-03-18: qty 15, 30d supply, fill #1
  Filled 2022-04-21: qty 15, 30d supply, fill #2

## 2022-02-14 MED ORDER — BD PEN NEEDLE SHORT U/F 31G X 8 MM MISC
1 refills | Status: DC
  Filled 2022-02-14: qty 100, 30d supply, fill #0
  Filled 2022-06-11: qty 100, 30d supply, fill #1

## 2022-02-17 LAB — HM DIABETES EYE EXAM

## 2022-02-18 NOTE — Telephone Encounter (Signed)
Patient is aware of appointment date and time at [place] on 03-05-22 at 9:00am.  Patient agreed to plan and verbalized understanding.  No further questions.

## 2022-02-20 ENCOUNTER — Other Ambulatory Visit: Payer: Self-pay

## 2022-02-20 MED ORDER — SODIUM BICARBONATE 650 MG PO TABS
650.0000 mg | ORAL_TABLET | Freq: Two times a day (BID) | ORAL | 11 refills | Status: DC
  Filled 2022-02-20: qty 60, 30d supply, fill #0

## 2022-03-03 ENCOUNTER — Other Ambulatory Visit: Payer: Self-pay

## 2022-03-18 ENCOUNTER — Other Ambulatory Visit: Payer: Self-pay | Admitting: Nurse Practitioner

## 2022-03-18 ENCOUNTER — Other Ambulatory Visit: Payer: Self-pay

## 2022-03-18 DIAGNOSIS — E559 Vitamin D deficiency, unspecified: Secondary | ICD-10-CM

## 2022-03-18 NOTE — Telephone Encounter (Signed)
Requested medication (s) are due for refill today: yes  Requested medication (s) are on the active medication list: yes  Last refill:  10/09/21 #12/1  Future visit scheduled: yes  Notes to clinic:  Unable to refill per protocol, cannot delegate.    Requested Prescriptions  Pending Prescriptions Disp Refills   Vitamin D, Ergocalciferol, (DRISDOL) 1.25 MG (50000 UNIT) CAPS capsule 12 capsule 1    Sig: Take 1 capsule (50,000 Units total) by mouth every 7 (seven) days.     Endocrinology:  Vitamins - Vitamin D Supplementation 2 Failed - 03/18/2022  1:33 PM      Failed - Manual Review: Route requests for 50,000 IU strength to the provider      Failed - Vitamin D in normal range and within 360 days    Vit D, 25-Hydroxy  Date Value Ref Range Status  04/25/2020 23.2 (L) 30.0 - 100.0 ng/mL Final    Comment:    Vitamin D deficiency has been defined by the [place] practice guideline as a level of serum 25-OH vitamin D less than 20 ng/mL (1,2). The Endocrine Society went on to further define vitamin D insufficiency as a level between 21 and 29 ng/mL (2). 1. IOM (Institute of Medicine). 2010. Dietary reference    intakes for calcium and D. [place]: The    [employer]. 2. Holick MF, Binkley Snyder, Bischoff-Ferrari HA, et al.    Evaluation, treatment, and prevention of vitamin D    deficiency: an Endocrine Society clinical practice    guideline. JCEM. 2011 Jul; 96(7):1911-30.          Passed - Ca in normal range and within 360 days    Calcium  Date Value Ref Range Status  12/10/2021 9.9 8.4 - 10.5 mg/dL Final         Passed - Valid encounter within last 12 months    Recent Outpatient Visits           2 months ago Controlled type 2 diabetes mellitus with hyperglycemia, with long-term current use of insulin [facility])   [place], [place] W, NP   5 months ago Controlled type 2 diabetes mellitus  with hyperglycemia, with long-term current use of insulin [facility])   [place], [place] W, NP   9 months ago Controlled type 2 diabetes mellitus with hyperglycemia, with long-term current use of insulin [facility])   [place], [place] W, NP   1 year ago Controlled type 2 diabetes mellitus with hyperglycemia, with long-term current use of insulin [facility])   [place], [place] W, NP   1 year ago Controlled type 2 diabetes mellitus with hyperglycemia, with long-term current use of insulin [facility])   [place], Vern[place] Buff, NP       Future Appointments             In 3 weeks Gildardo Pounds, NP [place]

## 2022-03-20 ENCOUNTER — Other Ambulatory Visit: Payer: Self-pay

## 2022-03-25 ENCOUNTER — Other Ambulatory Visit: Payer: Self-pay | Admitting: Nurse Practitioner

## 2022-03-25 ENCOUNTER — Other Ambulatory Visit: Payer: Self-pay

## 2022-03-25 DIAGNOSIS — E1165 Type 2 diabetes mellitus with hyperglycemia: Secondary | ICD-10-CM

## 2022-03-26 ENCOUNTER — Other Ambulatory Visit: Payer: Self-pay

## 2022-03-26 ENCOUNTER — Encounter: Payer: Self-pay | Admitting: Nurse Practitioner

## 2022-03-26 MED ORDER — GLIMEPIRIDE 4 MG PO TABS
4.0000 mg | ORAL_TABLET | Freq: Every day | ORAL | 0 refills | Status: DC
  Filled 2022-03-26 (×2): qty 30, 30d supply, fill #0

## 2022-04-03 ENCOUNTER — Other Ambulatory Visit: Payer: Self-pay

## 2022-04-03 ENCOUNTER — Other Ambulatory Visit: Payer: Self-pay | Admitting: Nurse Practitioner

## 2022-04-03 DIAGNOSIS — M1A09X0 Idiopathic chronic gout, multiple sites, without tophus (tophi): Secondary | ICD-10-CM

## 2022-04-03 DIAGNOSIS — E1165 Type 2 diabetes mellitus with hyperglycemia: Secondary | ICD-10-CM

## 2022-04-03 MED ORDER — BASAGLAR KWIKPEN 100 UNIT/ML ~~LOC~~ SOPN
10.0000 [IU] | PEN_INJECTOR | Freq: Every day | SUBCUTANEOUS | 0 refills | Status: DC
  Filled 2022-04-03: qty 3, 30d supply, fill #0

## 2022-04-03 MED ORDER — ALLOPURINOL 100 MG PO TABS
200.0000 mg | ORAL_TABLET | Freq: Every day | ORAL | 0 refills | Status: DC
  Filled 2022-04-03: qty 60, 30d supply, fill #0

## 2022-04-04 ENCOUNTER — Other Ambulatory Visit: Payer: Self-pay

## 2022-04-09 NOTE — Progress Notes (Signed)
Sent message, via epic in basket, requesting order in epic from surgeon     04/09/22 1445  Preop Orders  Has preop orders? No  Name of staff/physician contacted for orders(Indicate phone or IB message) E. Redmond Pulling, MD.

## 2022-04-09 NOTE — Progress Notes (Signed)
Sent message, via epic in basket, requesting orders in epic from surgeon.  

## 2022-04-10 ENCOUNTER — Ambulatory Visit: Payer: Self-pay | Admitting: General Surgery

## 2022-04-10 DIAGNOSIS — K8020 Calculus of gallbladder without cholecystitis without obstruction: Secondary | ICD-10-CM

## 2022-04-11 NOTE — Patient Instructions (Addendum)
SURGICAL WAITING ROOM VISITATION Patients having surgery or a procedure may have no more than 2 support people in the waiting area - these visitors may rotate.   Children under the age of 18 must have an adult with them who is not the patient. If the patient needs to stay at the hospital during part of their recovery, the visitor guidelines for inpatient rooms apply. Pre-op nurse will coordinate an appropriate time for 1 support person to accompany patient in pre-op.  This support person may not rotate.    Please refer to the [facility] website for the visitor guidelines for Inpatients (after your surgery is over and you are in a regular room).      Your procedure is scheduled on: 04-25-22   Report to [facility] Main Entrance    Report to admitting at 5:15 AM   Call this number if you have problems the morning of surgery [phone]   Do not eat food :After Midnight.   After Midnight you may have the following liquids until 4:30 AM DAY OF SURGERY  Water Non-Citrus Juices (without pulp, NO RED) Carbonated Beverages Black Coffee (NO MILK/CREAM OR CREAMERS, sugar ok)  Clear Tea (NO MILK/CREAM OR CREAMERS, sugar ok) regular and decaf                             Plain Jell-O (NO RED)                                           Fruit ices (not with fruit pulp, NO RED)                                     Popsicles (NO RED)                                                               Sports drinks like Gatorade (NO RED)                   The day of surgery:  Drink ONE (1) Pre-Surgery G2 at 4:30 AM the morning of surgery. Drink in one sitting. Do not sip.  This drink was given to you during your hospital  pre-op appointment visit. Nothing else to drink after completing the Pre-Surgery G2.          If you have questions, please contact your surgeon's office.   FOLLOW  ANY ADDITIONAL PRE OP INSTRUCTIONS YOU RECEIVED FROM YOUR SURGEON'S OFFICE!!!     Oral Hygiene is also  important to reduce your risk of infection.                                    Remember - BRUSH YOUR TEETH THE MORNING OF SURGERY WITH YOUR REGULAR TOOTHPASTE   Do NOT smoke after Midnight   Take these medicines the morning of surgery with A SIP OF WATER:   Allopurinol  Duloxetine  Tylenol if needed  How to Manage Your  Diabetes Before and After Surgery  Why is it important to control my blood sugar before and after surgery? Improving blood sugar levels before and after surgery helps healing and can limit problems. A way of improving blood sugar control is eating a healthy diet by:  Eating less sugar and carbohydrates  Increasing activity/exercise  Talking with your doctor about reaching your blood sugar goals High blood sugars (greater than 180 mg/dL) can raise your risk of infections and slow your recovery, so you will need to focus on controlling your diabetes during the weeks before surgery. Make sure that the doctor who takes care of your diabetes knows about your planned surgery including the date and location.  How do I manage my blood sugar before surgery? Check your blood sugar at least 4 times a day, starting 2 days before surgery, to make sure that the level is not too high or low. Check your blood sugar the morning of your surgery when you wake up and every 2 hours until you get to the Short Stay unit. If your blood sugar is less than 70 mg/dL, you will need to treat for low blood sugar: Do not take insulin. Treat a low blood sugar (less than 70 mg/dL) with  cup of clear juice (cranberry or apple), 4 glucose tablets, OR glucose gel. Recheck blood sugar in 15 minutes after treatment (to make sure it is greater than 70 mg/dL). If your blood sugar is not greater than 70 mg/dL on recheck, call [phone] for further instructions. Report your blood sugar to the short stay nurse when you get to Short Stay.  If you are admitted to the hospital after surgery: Your blood sugar  will be checked by the staff and you will probably be given insulin after surgery (instead of oral diabetes medicines) to make sure you have good blood sugar levels. The goal for blood sugar control after surgery is 80-180 mg/dL.   WHAT DO I DO ABOUT MY DIABETES MEDICATION?  Do not take oral diabetes medicines (pills) the morning of surgery.  THE NIGHT BEFORE SURGERY:  Take 5 units of Insulin Glargine.       THE MORNING OF SURGERY:  Do not take any diabetes medications.  Reviewed and Endorsed by [facility] Patient Education Committee, August 2015   Bring CPAP mask and tubing day of surgery.                              You may not have any metal on your body including hair pins, jewelry, and body piercing             Do not wear make-up, lotions, powders, perfumes or deodorant  Do not wear nail polish including gel and S&S, artificial/acrylic nails, or any other type of covering on natural nails including finger and toenails. If you have artificial nails, gel coating, etc. that needs to be removed by a nail salon please have this removed prior to surgery or surgery may need to be canceled/ delayed if the surgeon/ anesthesia feels like they are unable to be safely monitored.   Do not shave  48 hours prior to surgery.    Do not bring valuables to the hospital. [place].   Contacts, dentures or bridgework may not be worn into surgery.  DO NOT [place]. PHARMACY WILL DISPENSE MEDICATIONS LISTED ON YOUR MEDICATION LIST  TO YOU DURING YOUR ADMISSION [place]!   Patients discharged on the day of surgery will not be allowed to drive home.  Someone NEEDS to stay with you for the first 24 hours after anesthesia.  Special Instructions: Bring a copy of your healthcare power of attorney and living will documents the day of surgery if you haven't scanned them before.  Please read over the following fact sheets you  were given: IF Register [place]  If you received a COVID test during your pre-op visit  it is requested that you wear a mask when out in public, stay away from anyone that may not be feeling well and notify your surgeon if you develop symptoms. If you test positive for Covid or have been in contact with anyone that has tested positive in the last 10 days please notify you surgeon.  [place] - Preparing for Surgery Before surgery, you can play an important role.  Because skin is not sterile, your skin needs to be as free of germs as possible.  You can reduce the number of germs on your skin by washing with CHG (chlorahexidine gluconate) soap before surgery.  CHG is an antiseptic cleaner which kills germs and bonds with the skin to continue killing germs even after washing. Please DO NOT use if you have an allergy to CHG or antibacterial soaps.  If your skin becomes reddened/irritated stop using the CHG and inform your nurse when you arrive at Short Stay. Do not shave (including legs and underarms) for at least 48 hours prior to the first CHG shower.  You may shave your face/neck.  Please follow these instructions carefully:  1.  Shower with CHG Soap the night before surgery and the  morning of surgery.  2.  If you choose to wash your hair, wash your hair first as usual with your normal  shampoo.  3.  After you shampoo, rinse your hair and body thoroughly to remove the shampoo.                             4.  Use CHG as you would any other liquid soap.  You can apply chg directly to the skin and wash.  Gently with a scrungie or clean washcloth.  5.  Apply the CHG Soap to your body ONLY FROM THE NECK DOWN.   Do   not use on face/ open                           Wound or open sores. Avoid contact with eyes, ears mouth and   genitals (private parts).                       Wash face,  Genitals (private parts) with your normal soap.              6.  Wash thoroughly, paying special attention to the area where your    surgery  will be performed.  7.  Thoroughly rinse your body with warm water from the neck down.  8.  DO NOT shower/wash with your normal soap after using and rinsing off the CHG Soap.                9.  Pat yourself dry with a clean towel.  10.  Wear clean pajamas.            11.  Place clean sheets on your bed the night of your first shower and do not  sleep with pets. Day of Surgery : Do not apply any lotions/deodorants the morning of surgery.  Please wear clean clothes to the hospital/surgery center.  FAILURE TO FOLLOW THESE INSTRUCTIONS MAY RESULT IN THE CANCELLATION OF YOUR SURGERY  PATIENT SIGNATURE_________________________________  NURSE SIGNATURE__________________________________  ________________________________________________________________________

## 2022-04-14 ENCOUNTER — Other Ambulatory Visit: Payer: Self-pay

## 2022-04-14 ENCOUNTER — Encounter: Payer: Self-pay | Admitting: Nurse Practitioner

## 2022-04-14 ENCOUNTER — Ambulatory Visit: Payer: Commercial Managed Care - HMO | Attending: Nurse Practitioner | Admitting: Nurse Practitioner

## 2022-04-14 VITALS — BP 144/80 | HR 80 | Temp 98.0°F | Ht 61.0 in | Wt 223.8 lb

## 2022-04-14 DIAGNOSIS — I10 Essential (primary) hypertension: Secondary | ICD-10-CM | POA: Diagnosis not present

## 2022-04-14 DIAGNOSIS — N8110 Cystocele, unspecified: Secondary | ICD-10-CM | POA: Diagnosis not present

## 2022-04-14 DIAGNOSIS — Z23 Encounter for immunization: Secondary | ICD-10-CM

## 2022-04-14 DIAGNOSIS — E785 Hyperlipidemia, unspecified: Secondary | ICD-10-CM

## 2022-04-14 DIAGNOSIS — Z794 Long term (current) use of insulin: Secondary | ICD-10-CM | POA: Diagnosis not present

## 2022-04-14 DIAGNOSIS — N1832 Chronic kidney disease, stage 3b: Secondary | ICD-10-CM

## 2022-04-14 DIAGNOSIS — E1165 Type 2 diabetes mellitus with hyperglycemia: Secondary | ICD-10-CM

## 2022-04-14 LAB — GLUCOSE, POCT (MANUAL RESULT ENTRY): POC Glucose: 133 mg/dl — AB (ref 70–99)

## 2022-04-14 LAB — POCT GLYCOSYLATED HEMOGLOBIN (HGB A1C): Hemoglobin A1C: 7.2 % — AB (ref 4.0–5.6)

## 2022-04-14 MED ORDER — LISINOPRIL 10 MG PO TABS
10.0000 mg | ORAL_TABLET | Freq: Every day | ORAL | 1 refills | Status: DC
  Filled 2022-04-14: qty 30, 30d supply, fill #0
  Filled 2022-05-20: qty 30, 30d supply, fill #1
  Filled 2022-06-19: qty 30, 30d supply, fill #2

## 2022-04-14 MED ORDER — TRUE METRIX BLOOD GLUCOSE TEST VI STRP
ORAL_STRIP | 12 refills | Status: DC
  Filled 2022-04-14: qty 100, fill #0

## 2022-04-14 MED ORDER — BASAGLAR KWIKPEN 100 UNIT/ML ~~LOC~~ SOPN
15.0000 [IU] | PEN_INJECTOR | Freq: Every day | SUBCUTANEOUS | 6 refills | Status: DC
  Filled 2022-04-14: qty 3, 20d supply, fill #0
  Filled 2022-05-20: qty 3, 20d supply, fill #1
  Filled 2022-06-11: qty 3, 20d supply, fill #2
  Filled 2022-07-02: qty 3, 20d supply, fill #3

## 2022-04-14 NOTE — Patient Instructions (Signed)
Increase basaglar by 2 units every 3 days if fasting blood glucose level greater than 130

## 2022-04-14 NOTE — Progress Notes (Signed)
Assessment & Plan:  Kristy Flores was seen today for diabetes.  Diagnoses and all orders for this visit:  Essential hypertension -     lisinopril (ZESTRIL) 10 MG tablet; Take 1 tablet (10 mg total) by mouth daily. Please fill as a 90 day supply  Controlled type 2 diabetes mellitus with hyperglycemia, with long-term current use of insulin (HCC) -     Discontinue: glucose blood (TRUE METRIX BLOOD GLUCOSE TEST) test strip; Use as instructed -     POCT glycosylated hemoglobin (Hb A1C) -     POCT glucose (manual entry) -     CMP14+EGFR -     Insulin Glargine (BASAGLAR KWIKPEN) 100 UNIT/ML; Inject 15 Units into the skin at bedtime.  Prolapse of female bladder, acquired -     Ambulatory referral to Gynecology  Dyslipidemia, goal LDL below 70 -     Lipid panel  Chronic kidney disease (CKD) stage G3b/A1, moderately decreased glomerular filtration rate (GFR) between 30-44 mL/min/1.73 square meter and albuminuria creatinine ratio less than 30 mg/g (HCC) -     CMP14+EGFR -     CBC  Need for immunization against influenza -     Flu Vaccine QUAD 11moIM (Fluarix, Fluzone & Alfiuria Quad PF)    Patient has been counseled on age-appropriate routine health concerns for screening and prevention. These are reviewed and up-to-date. Referrals have been placed accordingly. Immunizations are up-to-date or declined.    Subjective:   Chief Complaint  Patient presents with   Diabetes   HPI Kristy Deiter89y.o. female presents to office today for follow up to DM and HTN She has lap chole scheduled for 04-25-2022. Needs colonoscopy but would like to hold off on this for right now.    She has a past medical history of Depression, DM2, Hyperlipidemia, Hypertension, Insomnia, and Restless leg syndrome.     HTN Blood pressure is elevated. Will increase lisinopril to 10 mg  BP Readings from Last 3 Encounters:  04/14/22 (!) 144/80  02/13/22 (!) 138/38  01/10/22 (!) 148/79      DM 2 Blood glucose  levels averaging 130-200s still using basaglar 10 units nightly. Will increase to 15 units today and she will continue on glimepiride 4 mg daily.  Lab Results  Component Value Date   HGBA1C 7.2 (A) 04/14/2022    Lab Results  Component Value Date   HGBA1C 7.5 (H) 01/10/2022   LDL not at goal with lovastatin 40 mg daily. Weight is stable.  Lab Results  Component Value Date   LDLCALC 104 (H) 10/09/2021    GU Feels something hanging in her vagina when she wipes. She had a hysterectomy many years ago so likely bladder prolapse although she does endorse a history of "Polyps" in her vaginal area in the past over 30 years ago which required surgical excision.    Review of Systems  Constitutional:  Negative for fever, malaise/fatigue and weight loss.  HENT: Negative.  Negative for nosebleeds.   Eyes: Negative.  Negative for blurred vision, double vision and photophobia.  Respiratory: Negative.  Negative for cough and shortness of breath.   Cardiovascular: Negative.  Negative for chest pain, palpitations and leg swelling.  Gastrointestinal: Negative.  Negative for heartburn, nausea and vomiting.  Genitourinary:        SEE HPI  Musculoskeletal: Negative.  Negative for myalgias.  Neurological: Negative.  Negative for dizziness, focal weakness, seizures and headaches.  Psychiatric/Behavioral: Negative.  Negative for suicidal ideas.     Past  Medical History:  Diagnosis Date   Depression    Diabetes mellitus without complication ([place])    Hyperlipidemia    Hypertension    Insomnia    Restless leg syndrome     Past Surgical History:  Procedure Laterality Date   ABDOMINAL HYSTERECTOMY     CESAREAN SECTION     ERCP N/A 02/13/2022   Procedure: ENDOSCOPIC RETROGRADE CHOLANGIOPANCREATOGRAPHY (ERCP);  Surgeon: Milus Banister, MD;  Location: [facility];  Service: Gastroenterology;  Laterality: N/A;   MELANOMA EXCISION  1985   REMOVAL OF STONES  02/13/2022   Procedure: REMOVAL OF STONES;   Surgeon: Milus Banister, MD;  Location: [facility];  Service: Gastroenterology;;   Joan Mayans  02/13/2022   Procedure: Joan Mayans;  Surgeon: Milus Banister, MD;  Location: [facility];  Service: Gastroenterology;;    Family History  Problem Relation Age of Onset   Kidney disease Mother    Diabetes Mother    Hypertension Mother    Cancer Father     Social History Reviewed with no changes to be made today.   Outpatient Medications Prior to Visit  Medication Sig Dispense Refill   acetaminophen (TYLENOL) 500 MG tablet Take 1,000 mg by mouth every 6 (six) hours as needed for moderate pain.     allopurinol (ZYLOPRIM) 100 MG tablet Take 2 tablets (200 mg total) by mouth daily. 60 tablet 0   Blood Glucose Monitoring Suppl (TRUE METRIX METER) w/Device KIT Use as instructed. Check blood glucose level by fingerstick twice per day. 1 kit 0   DULoxetine (CYMBALTA) 30 MG capsule Take 1 capsule (30 mg total) by mouth 2 (two) times daily. 60 capsule 2   furosemide (LASIX) 20 MG tablet Take 1 tablet (20 mg total) by mouth every other day. 15 tablet 2   glimepiride (AMARYL) 4 MG tablet Take 1 tablet (4 mg total) by mouth daily before breakfast. 30 tablet 0   Insulin Pen Needle (B-D ULTRAFINE III SHORT PEN) 31G X 8 MM MISC USE AS INSTRUCTED. INJECT INTO THE SKIN ONCE NIGHTLY. 100 each 1   lovastatin (MEVACOR) 40 MG tablet Take 1 tablet (40 mg total) by mouth at bedtime. 90 tablet 2   sodium bicarbonate 650 MG tablet Take 1 tablet (650 mg total) by mouth 2 (two) times daily. 60 tablet 11   TRUEplus Lancets 28G MISC Use as instructed. Check blood glucose level by fingerstick twice per day. 100 each 3   Vitamin D, Ergocalciferol, (DRISDOL) 1.25 MG (50000 UNIT) CAPS capsule Take 1 capsule (50,000 Units total) by mouth every 7 (seven) days. 12 capsule 1   glucose blood (TRUE METRIX BLOOD GLUCOSE TEST) test strip Use as instructed 100 each 12   Insulin Glargine (BASAGLAR KWIKPEN) 100 UNIT/ML Inject  10 Units into the skin at bedtime. 3 mL 0   lisinopril (ZESTRIL) 5 MG tablet Take 1 tablet (5 mg total) by mouth daily. Please fill as a 90 day supply (Patient not taking: Reported on 04/10/2022) 90 tablet 1   lisinopril (ZESTRIL) 5 MG tablet TAKE 1 TABLET BY MOUTH DAILY 90 tablet 3   sodium bicarbonate 650 MG tablet TAKE 1 TABLET BY MOUTH DAILY (Patient not taking: Reported on 04/10/2022) 30 tablet 11   sodium bicarbonate 650 MG tablet Take 1 tablet (650 mg total) by mouth 2 (two) times daily. 60 tablet 11   No facility-administered medications prior to visit.    Allergies  Allergen Reactions   Chlorzoxazone Nausea And Vomiting   Floxuridine  Hypoglycemia / pass out   Codeine Nausea And Vomiting   Gabapentin Swelling    AKI   Nsaids Other (See Comments)    CKD   Penicillins     Broke out, stomach upset.    Victoza [Liraglutide]     Shut down kidneys        Objective:    BP (!) 144/80   Pulse 80   Temp 98 F (36.7 C) (Oral)   Ht 5' 1"  (1.549 m)   Wt 223 lb 12.8 oz (101.5 kg)   SpO2 99%   BMI 42.29 kg/m  Wt Readings from Last 3 Encounters:  04/14/22 223 lb 12.8 oz (101.5 kg)  02/13/22 221 lb (100.2 kg)  01/10/22 226 lb 12.8 oz (102.9 kg)    Physical Exam Vitals and nursing note reviewed.  Constitutional:      Appearance: She is well-developed.  HENT:     Head: Normocephalic and atraumatic.  Cardiovascular:     Rate and Rhythm: Normal rate and regular rhythm.     Heart sounds: Normal heart sounds. No murmur heard.    No friction rub. No gallop.  Pulmonary:     Effort: Pulmonary effort is normal. No tachypnea or respiratory distress.     Breath sounds: Normal breath sounds. No decreased breath sounds, wheezing, rhonchi or rales.  Chest:     Chest wall: No tenderness.  Abdominal:     General: Bowel sounds are normal.     Palpations: Abdomen is soft.  Musculoskeletal:        General: Normal range of motion.     Cervical back: Normal range of motion.   Skin:    General: Skin is warm and dry.  Neurological:     Mental Status: She is alert and oriented to person, place, and time.     Coordination: Coordination normal.  Psychiatric:        Behavior: Behavior normal. Behavior is cooperative.        Thought Content: Thought content normal.        Judgment: Judgment normal.          Patient has been counseled extensively about nutrition and exercise as well as the importance of adherence with medications and regular follow-up. The patient was given clear instructions to go to ER or return to medical center if symptoms don't improve, worsen or new problems develop. The patient verbalized understanding.   Follow-up: Return in about 3 months (around 07/14/2022).   Gildardo Pounds, FNP-BC [facility] [place], [place]   04/14/2022, 2:10 PM

## 2022-04-15 LAB — CMP14+EGFR
ALT: 13 IU/L (ref 0–32)
AST: 13 IU/L (ref 0–40)
Albumin/Globulin Ratio: 1.6 (ref 1.2–2.2)
Albumin: 4 g/dL (ref 3.9–4.9)
Alkaline Phosphatase: 58 IU/L (ref 44–121)
BUN/Creatinine Ratio: 26 (ref 12–28)
BUN: 36 mg/dL — ABNORMAL HIGH (ref 8–27)
Bilirubin Total: 0.2 mg/dL (ref 0.0–1.2)
CO2: 24 mmol/L (ref 20–29)
Calcium: 10 mg/dL (ref 8.7–10.3)
Chloride: 106 mmol/L (ref 96–106)
Creatinine, Ser: 1.41 mg/dL — ABNORMAL HIGH (ref 0.57–1.00)
Globulin, Total: 2.5 g/dL (ref 1.5–4.5)
Glucose: 122 mg/dL — ABNORMAL HIGH (ref 70–99)
Potassium: 4.2 mmol/L (ref 3.5–5.2)
Sodium: 146 mmol/L — ABNORMAL HIGH (ref 134–144)
Total Protein: 6.5 g/dL (ref 6.0–8.5)
eGFR: 42 mL/min/{1.73_m2} — ABNORMAL LOW (ref >59)

## 2022-04-15 LAB — LIPID PANEL
Chol/HDL Ratio: 4 ratio (ref 0.0–4.4)
Cholesterol, Total: 198 mg/dL (ref 100–199)
HDL: 50 mg/dL (ref >39)
LDL Chol Calc (NIH): 118 mg/dL — ABNORMAL HIGH (ref 0–99)
Triglycerides: 172 mg/dL — ABNORMAL HIGH (ref 0–149)
VLDL Cholesterol Cal: 30 mg/dL (ref 5–40)

## 2022-04-15 LAB — CBC
Hematocrit: 36.9 % (ref 34.0–46.6)
Hemoglobin: 12.2 g/dL (ref 11.1–15.9)
MCH: 28.9 pg (ref 26.6–33.0)
MCHC: 33.1 g/dL (ref 31.5–35.7)
MCV: 87 fL (ref 79–97)
Platelets: 270 10*3/uL (ref 150–450)
RBC: 4.22 x10E6/uL (ref 3.77–5.28)
RDW: 13.5 % (ref 11.7–15.4)
WBC: 7.6 10*3/uL (ref 3.4–10.8)

## 2022-04-15 NOTE — Progress Notes (Signed)
COVID Vaccine Completed:  Date of COVID positive in last 90 days:  PCP - Geryl Rankins, NP Cardiologist -   Chest x-ray -  EKG -  Stress Test -  ECHO -  Cardiac Cath -  Pacemaker/ICD device last checked: Spinal Cord Stimulator:  Bowel Prep -   Sleep Study -  CPAP -   Fasting Blood Sugar -  Checks Blood Sugar _____ times a day  Blood Thinner Instructions: Aspirin Instructions: Last Dose:  Activity level:  Can go up a flight of stairs and perform activities of daily living without stopping and without symptoms of chest pain or shortness of breath.  Able to exercise without symptoms  Unable to go up a flight of stairs without symptoms of     Anesthesia review:   Patient denies shortness of breath, fever, cough and chest pain at PAT appointment  Patient verbalized understanding of instructions that were given to them at the PAT appointment. Patient was also instructed that they will need to review over the PAT instructions again at home before surgery.

## 2022-04-16 ENCOUNTER — Encounter (HOSPITAL_COMMUNITY)
Admission: RE | Admit: 2022-04-16 | Discharge: 2022-04-16 | Disposition: A | Discharge status: Home / Self Care | Payer: Commercial Managed Care - HMO | Source: Ambulatory Visit | Attending: General Surgery | Admitting: General Surgery

## 2022-04-16 ENCOUNTER — Other Ambulatory Visit: Payer: Self-pay

## 2022-04-16 ENCOUNTER — Encounter (HOSPITAL_COMMUNITY): Payer: Self-pay

## 2022-04-16 VITALS — BP 166/78 | HR 85 | Temp 98.2°F | Resp 18 | Ht 61.0 in | Wt 223.6 lb

## 2022-04-16 DIAGNOSIS — Z01818 Encounter for other preprocedural examination: Secondary | ICD-10-CM | POA: Diagnosis not present

## 2022-04-16 DIAGNOSIS — E119 Type 2 diabetes mellitus without complications: Secondary | ICD-10-CM | POA: Diagnosis not present

## 2022-04-16 DIAGNOSIS — K8020 Calculus of gallbladder without cholecystitis without obstruction: Secondary | ICD-10-CM | POA: Diagnosis not present

## 2022-04-16 DIAGNOSIS — Z794 Long term (current) use of insulin: Secondary | ICD-10-CM | POA: Diagnosis not present

## 2022-04-16 HISTORY — DX: Headache, unspecified: R51.9

## 2022-04-16 HISTORY — DX: Anemia, unspecified: D64.9

## 2022-04-16 HISTORY — DX: Anxiety disorder, unspecified: F41.9

## 2022-04-16 HISTORY — DX: Pneumonia, unspecified organism: J18.9

## 2022-04-16 HISTORY — DX: Malignant melanoma of skin, unspecified: C43.9

## 2022-04-16 LAB — CBC WITH DIFFERENTIAL/PLATELET
Abs Immature Granulocytes: 0.02 10*3/uL (ref 0.00–0.07)
Basophils Absolute: 0.1 10*3/uL (ref 0.0–0.1)
Basophils Relative: 1 %
Eosinophils Absolute: 0.2 10*3/uL (ref 0.0–0.5)
Eosinophils Relative: 3 %
HCT: 38.3 % (ref 36.0–46.0)
Hemoglobin: 11.7 g/dL — ABNORMAL LOW (ref 12.0–15.0)
Immature Granulocytes: 0 %
Lymphocytes Relative: 24 %
Lymphs Abs: 1.8 10*3/uL (ref 0.7–4.0)
MCH: 28.3 pg (ref 26.0–34.0)
MCHC: 30.5 g/dL (ref 30.0–36.0)
MCV: 92.5 fL (ref 80.0–100.0)
Monocytes Absolute: 0.6 10*3/uL (ref 0.1–1.0)
Monocytes Relative: 8 %
Neutro Abs: 5 10*3/uL (ref 1.7–7.7)
Neutrophils Relative %: 64 %
Platelets: 265 10*3/uL (ref 150–400)
RBC: 4.14 MIL/uL (ref 3.87–5.11)
RDW: 14.3 % (ref 11.5–15.5)
WBC: 7.7 10*3/uL (ref 4.0–10.5)
nRBC: 0 % (ref 0.0–0.2)

## 2022-04-16 LAB — GLUCOSE, CAPILLARY: Glucose-Capillary: 127 mg/dL — ABNORMAL HIGH (ref 70–99)

## 2022-04-21 ENCOUNTER — Other Ambulatory Visit: Payer: Self-pay

## 2022-04-24 ENCOUNTER — Other Ambulatory Visit: Payer: Self-pay

## 2022-04-24 ENCOUNTER — Other Ambulatory Visit: Payer: Self-pay | Admitting: Family Medicine

## 2022-04-24 DIAGNOSIS — E1165 Type 2 diabetes mellitus with hyperglycemia: Secondary | ICD-10-CM

## 2022-04-24 MED ORDER — GLIMEPIRIDE 4 MG PO TABS
4.0000 mg | ORAL_TABLET | Freq: Every day | ORAL | 1 refills | Status: DC
  Filled 2022-04-24: qty 30, 30d supply, fill #0
  Filled 2022-05-20: qty 30, 30d supply, fill #1
  Filled 2022-06-19: qty 30, 30d supply, fill #2
  Filled 2022-07-22: qty 30, 30d supply, fill #3
  Filled 2022-08-19: qty 30, 30d supply, fill #4
  Filled 2022-09-11 (×2): qty 30, 30d supply, fill #5

## 2022-04-25 ENCOUNTER — Ambulatory Visit (HOSPITAL_COMMUNITY): Payer: Commercial Managed Care - HMO | Admitting: Physician Assistant

## 2022-04-25 ENCOUNTER — Other Ambulatory Visit: Payer: Self-pay

## 2022-04-25 ENCOUNTER — Ambulatory Visit (HOSPITAL_COMMUNITY)
Admission: RE | Admit: 2022-04-25 | Discharge: 2022-04-25 | Disposition: A | Discharge status: Home / Self Care | Payer: Commercial Managed Care - HMO | Source: Intra-hospital | Attending: General Surgery | Admitting: General Surgery

## 2022-04-25 ENCOUNTER — Encounter (HOSPITAL_COMMUNITY)
Admission: RE | Disposition: A | Discharge status: Home / Self Care | Payer: Self-pay | Source: Intra-hospital | Attending: General Surgery

## 2022-04-25 ENCOUNTER — Encounter (HOSPITAL_COMMUNITY): Payer: Self-pay | Admitting: General Surgery

## 2022-04-25 ENCOUNTER — Ambulatory Visit (HOSPITAL_BASED_OUTPATIENT_CLINIC_OR_DEPARTMENT_OTHER): Payer: Commercial Managed Care - HMO | Admitting: Anesthesiology

## 2022-04-25 DIAGNOSIS — Z7984 Long term (current) use of oral hypoglycemic drugs: Secondary | ICD-10-CM | POA: Diagnosis not present

## 2022-04-25 DIAGNOSIS — E119 Type 2 diabetes mellitus without complications: Secondary | ICD-10-CM | POA: Diagnosis not present

## 2022-04-25 DIAGNOSIS — N189 Chronic kidney disease, unspecified: Secondary | ICD-10-CM | POA: Diagnosis not present

## 2022-04-25 DIAGNOSIS — Z90710 Acquired absence of both cervix and uterus: Secondary | ICD-10-CM | POA: Diagnosis not present

## 2022-04-25 DIAGNOSIS — I129 Hypertensive chronic kidney disease with stage 1 through stage 4 chronic kidney disease, or unspecified chronic kidney disease: Secondary | ICD-10-CM | POA: Insufficient documentation

## 2022-04-25 DIAGNOSIS — E1122 Type 2 diabetes mellitus with diabetic chronic kidney disease: Secondary | ICD-10-CM | POA: Diagnosis not present

## 2022-04-25 DIAGNOSIS — I10 Essential (primary) hypertension: Secondary | ICD-10-CM | POA: Diagnosis not present

## 2022-04-25 DIAGNOSIS — K8010 Calculus of gallbladder with chronic cholecystitis without obstruction: Secondary | ICD-10-CM

## 2022-04-25 DIAGNOSIS — Z794 Long term (current) use of insulin: Secondary | ICD-10-CM

## 2022-04-25 DIAGNOSIS — K8020 Calculus of gallbladder without cholecystitis without obstruction: Secondary | ICD-10-CM

## 2022-04-25 DIAGNOSIS — K8012 Calculus of gallbladder with acute and chronic cholecystitis without obstruction: Secondary | ICD-10-CM | POA: Insufficient documentation

## 2022-04-25 HISTORY — PX: CHOLECYSTECTOMY: SHX55

## 2022-04-25 LAB — GLUCOSE, CAPILLARY
Glucose-Capillary: 127 mg/dL — ABNORMAL HIGH (ref 70–99)
Glucose-Capillary: 216 mg/dL — ABNORMAL HIGH (ref 70–99)

## 2022-04-25 SURGERY — LAPAROSCOPIC CHOLECYSTECTOMY WITH INTRAOPERATIVE CHOLANGIOGRAM
Anesthesia: General | Site: Abdomen

## 2022-04-25 MED ORDER — FENTANYL CITRATE PF 50 MCG/ML IJ SOSY
25.0000 ug | PREFILLED_SYRINGE | INTRAMUSCULAR | Status: DC | PRN
  Administered 2022-04-25 (×2): 25 ug via INTRAVENOUS

## 2022-04-25 MED ORDER — LACTATED RINGERS IR SOLN
Status: DC | PRN
  Administered 2022-04-25: 1000 mL

## 2022-04-25 MED ORDER — LIDOCAINE 2% (20 MG/ML) 5 ML SYRINGE
INTRAMUSCULAR | Status: DC | PRN
  Administered 2022-04-25: 60 mg via INTRAVENOUS

## 2022-04-25 MED ORDER — CHLORHEXIDINE GLUCONATE CLOTH 2 % EX PADS: 6.0000 | MEDICATED_PAD | Freq: Once | CUTANEOUS | Status: DC

## 2022-04-25 MED ORDER — PROPOFOL 10 MG/ML IV BOLUS
INTRAVENOUS | Status: DC | PRN
  Administered 2022-04-25: 120 mg via INTRAVENOUS

## 2022-04-25 MED ORDER — 0.9 % SODIUM CHLORIDE (POUR BTL) OPTIME
TOPICAL | Status: DC | PRN
  Administered 2022-04-25: 1000 mL

## 2022-04-25 MED ORDER — PHENYLEPHRINE 80 MCG/ML (10ML) SYRINGE FOR IV PUSH (FOR BLOOD PRESSURE SUPPORT)
PREFILLED_SYRINGE | INTRAVENOUS | Status: DC | PRN
  Administered 2022-04-25: 160 ug via INTRAVENOUS

## 2022-04-25 MED ORDER — FENTANYL CITRATE (PF) 100 MCG/2ML IJ SOLN
INTRAMUSCULAR | Status: DC | PRN
  Administered 2022-04-25: 50 ug via INTRAVENOUS
  Administered 2022-04-25: 100 ug via INTRAVENOUS

## 2022-04-25 MED ORDER — ROCURONIUM BROMIDE 10 MG/ML (PF) SYRINGE
PREFILLED_SYRINGE | INTRAVENOUS | Status: DC | PRN
  Administered 2022-04-25: 60 mg via INTRAVENOUS

## 2022-04-25 MED ORDER — ONDANSETRON HCL 4 MG/2ML IJ SOLN
INTRAMUSCULAR | Status: AC
  Filled 2022-04-25: qty 2

## 2022-04-25 MED ORDER — MIDAZOLAM HCL 2 MG/2ML IJ SOLN
INTRAMUSCULAR | Status: AC
  Filled 2022-04-25: qty 2

## 2022-04-25 MED ORDER — ACETAMINOPHEN 160 MG/5ML PO SOLN: 325.0000 mg | ORAL | Status: DC | PRN

## 2022-04-25 MED ORDER — CIPROFLOXACIN IN D5W 400 MG/200ML IV SOLN
400.0000 mg | INTRAVENOUS | Status: AC
  Administered 2022-04-25: 400 mg via INTRAVENOUS
  Filled 2022-04-25: qty 200

## 2022-04-25 MED ORDER — MIDAZOLAM HCL 5 MG/5ML IJ SOLN
INTRAMUSCULAR | Status: DC | PRN
  Administered 2022-04-25: 2 mg via INTRAVENOUS

## 2022-04-25 MED ORDER — BUPIVACAINE-EPINEPHRINE 0.25% -1:200000 IJ SOLN
INTRAMUSCULAR | Status: DC | PRN
  Administered 2022-04-25: 18 mL

## 2022-04-25 MED ORDER — LACTATED RINGERS IV SOLN: INTRAVENOUS | Status: DC

## 2022-04-25 MED ORDER — ACETAMINOPHEN 325 MG PO TABS: 325.0000 mg | ORAL_TABLET | ORAL | Status: DC | PRN

## 2022-04-25 MED ORDER — SUGAMMADEX SODIUM 200 MG/2ML IV SOLN
INTRAVENOUS | Status: DC | PRN
  Administered 2022-04-25: 200 mg via INTRAVENOUS

## 2022-04-25 MED ORDER — ACETAMINOPHEN 500 MG PO TABS
1000.0000 mg | ORAL_TABLET | ORAL | Status: AC
  Administered 2022-04-25: 1000 mg via ORAL
  Filled 2022-04-25: qty 2

## 2022-04-25 MED ORDER — BUPIVACAINE-EPINEPHRINE (PF) 0.25% -1:200000 IJ SOLN
INTRAMUSCULAR | Status: AC
  Filled 2022-04-25: qty 30

## 2022-04-25 MED ORDER — FENTANYL CITRATE (PF) 250 MCG/5ML IJ SOLN
INTRAMUSCULAR | Status: AC
  Filled 2022-04-25: qty 5

## 2022-04-25 MED ORDER — ONDANSETRON HCL 4 MG/2ML IJ SOLN
INTRAMUSCULAR | Status: DC | PRN
  Administered 2022-04-25: 4 mg via INTRAVENOUS

## 2022-04-25 MED ORDER — ONDANSETRON HCL 4 MG/2ML IJ SOLN: 4.0000 mg | Freq: Once | INTRAMUSCULAR | Status: DC | PRN

## 2022-04-25 MED ORDER — FENTANYL CITRATE PF 50 MCG/ML IJ SOSY
PREFILLED_SYRINGE | INTRAMUSCULAR | Status: AC
  Filled 2022-04-25: qty 1

## 2022-04-25 MED ORDER — ORAL CARE MOUTH RINSE: 15.0000 mL | Freq: Once | OROMUCOSAL | Status: AC

## 2022-04-25 MED ORDER — HEMOSTATIC AGENTS (NO CHARGE) OPTIME
TOPICAL | Status: DC | PRN
  Administered 2022-04-25: 1

## 2022-04-25 MED ORDER — CHLORHEXIDINE GLUCONATE 0.12 % MT SOLN
15.0000 mL | Freq: Once | OROMUCOSAL | Status: AC
  Administered 2022-04-25: 15 mL via OROMUCOSAL

## 2022-04-25 MED ORDER — STERILE WATER FOR INJECTION IV SOLN
INTRAMUSCULAR | Status: DC | PRN
  Administered 2022-04-25: 2 mL

## 2022-04-25 MED ORDER — OXYCODONE HCL 5 MG/5ML PO SOLN: 5.0000 mg | Freq: Once | ORAL | Status: AC | PRN

## 2022-04-25 MED ORDER — OXYCODONE HCL 5 MG PO TABS
ORAL_TABLET | ORAL | Status: AC
  Filled 2022-04-25: qty 1

## 2022-04-25 MED ORDER — OXYCODONE HCL 5 MG PO TABS
5.0000 mg | ORAL_TABLET | Freq: Once | ORAL | Status: AC | PRN
  Administered 2022-04-25: 5 mg via ORAL

## 2022-04-25 MED ORDER — LABETALOL HCL 5 MG/ML IV SOLN
INTRAVENOUS | Status: AC
  Filled 2022-04-25: qty 4

## 2022-04-25 MED ORDER — OXYCODONE HCL 5 MG PO TABS
5.0000 mg | ORAL_TABLET | Freq: Four times a day (QID) | ORAL | 0 refills | Status: DC | PRN
  Filled 2022-04-25: qty 15, 4d supply, fill #0

## 2022-04-25 MED ORDER — PROPOFOL 10 MG/ML IV BOLUS
INTRAVENOUS | Status: AC
  Filled 2022-04-25: qty 20

## 2022-04-25 MED ORDER — LABETALOL HCL 5 MG/ML IV SOLN
5.0000 mg | Freq: Once | INTRAVENOUS | Status: AC
  Administered 2022-04-25: 5 mg via INTRAVENOUS

## 2022-04-25 MED ORDER — DEXAMETHASONE SODIUM PHOSPHATE 10 MG/ML IJ SOLN
INTRAMUSCULAR | Status: DC | PRN
  Administered 2022-04-25: 10 mg via INTRAVENOUS

## 2022-04-25 MED ORDER — MEPERIDINE HCL 50 MG/ML IJ SOLN: 6.2500 mg | INTRAMUSCULAR | Status: DC | PRN

## 2022-04-25 SURGICAL SUPPLY — 51 items
APPLICATOR ARISTA FLEXITIP XL (MISCELLANEOUS) IMPLANT
APPLIER CLIP 5 13 M/L LIGAMAX5 (MISCELLANEOUS)
APPLIER CLIP ROT 10 11.4 M/L (STAPLE)
BAG COUNTER SPONGE SURGICOUNT (BAG) IMPLANT
CABLE HIGH FREQUENCY MONO STRZ (ELECTRODE) ×2 IMPLANT
CHLORAPREP W/TINT 26 (MISCELLANEOUS) ×2 IMPLANT
CLIP APPLIE 5 13 M/L LIGAMAX5 (MISCELLANEOUS) IMPLANT
CLIP APPLIE ROT 10 11.4 M/L (STAPLE) IMPLANT
CLIP LIGATING HEMO O LOK GREEN (MISCELLANEOUS) IMPLANT
COVER MAYO STAND XLG (MISCELLANEOUS) IMPLANT
COVER SURGICAL LIGHT HANDLE (MISCELLANEOUS) ×2 IMPLANT
DERMABOND ADVANCED .7 DNX12 (GAUZE/BANDAGES/DRESSINGS) IMPLANT
DRAPE C-ARM 42X120 X-RAY (DRAPES) IMPLANT
DRSG TEGADERM 2-3/8X2-3/4 SM (GAUZE/BANDAGES/DRESSINGS) ×6 IMPLANT
DRSG TEGADERM 4X4.75 (GAUZE/BANDAGES/DRESSINGS) ×2 IMPLANT
ELECT REM PT RETURN 15FT ADLT (MISCELLANEOUS) ×2 IMPLANT
GAUZE SPONGE 2X2 8PLY STRL LF (GAUZE/BANDAGES/DRESSINGS) ×2 IMPLANT
GLOVE BIO SURGEON STRL SZ7.5 (GLOVE) ×2 IMPLANT
GLOVE INDICATOR 8.0 STRL GRN (GLOVE) ×2 IMPLANT
GOWN STRL REUS W/ TWL XL LVL3 (GOWN DISPOSABLE) ×2 IMPLANT
GOWN STRL REUS W/TWL XL LVL3 (GOWN DISPOSABLE) ×2
GRASPER SUT TROCAR 14GX15 (MISCELLANEOUS) IMPLANT
HEMOSTAT ARISTA ABSORB 3G PWDR (HEMOSTASIS) IMPLANT
HEMOSTAT SNOW SURGICEL 2X4 (HEMOSTASIS) IMPLANT
IRRIG SUCT STRYKERFLOW 2 WTIP (MISCELLANEOUS) ×2
IRRIGATION SUCT STRKRFLW 2 WTP (MISCELLANEOUS) ×2 IMPLANT
KIT BASIN OR (CUSTOM PROCEDURE TRAY) ×2 IMPLANT
KIT TURNOVER KIT A (KITS) IMPLANT
L-HOOK LAP DISP 36CM (ELECTROSURGICAL)
LHOOK LAP DISP 36CM (ELECTROSURGICAL) IMPLANT
POUCH RETRIEVAL ECOSAC 10 (ENDOMECHANICALS) ×2 IMPLANT
POUCH RETRIEVAL ECOSAC 10MM (ENDOMECHANICALS) ×2
SCISSORS LAP 5X35 DISP (ENDOMECHANICALS) ×2 IMPLANT
SET CHOLANGIOGRAPH MIX (MISCELLANEOUS) IMPLANT
SET TUBE SMOKE EVAC HIGH FLOW (TUBING) ×2 IMPLANT
SLEEVE Z-THREAD 12X100MM (TROCAR) IMPLANT
SLEEVE Z-THREAD 5X100MM (TROCAR) ×4 IMPLANT
SPIKE FLUID TRANSFER (MISCELLANEOUS) ×2 IMPLANT
STRIP CLOSURE SKIN 1/2X4 (GAUZE/BANDAGES/DRESSINGS) ×2 IMPLANT
SUT MNCRL AB 4-0 PS2 18 (SUTURE) ×2 IMPLANT
SUT VIC AB 0 UR5 27 (SUTURE) IMPLANT
SUT VICRYL 0 TIES 12 18 (SUTURE) IMPLANT
SUT VICRYL 0 UR6 27IN ABS (SUTURE) IMPLANT
TOWEL OR 17X26 10 PK STRL BLUE (TOWEL DISPOSABLE) ×2 IMPLANT
TOWEL OR NON WOVEN STRL DISP B (DISPOSABLE) ×2 IMPLANT
TRAY LAPAROSCOPIC (CUSTOM PROCEDURE TRAY) ×2 IMPLANT
TROCAR 11X100 Z THREAD (TROCAR) IMPLANT
TROCAR BALLN 12MMX100 BLUNT (TROCAR) ×2 IMPLANT
TROCAR XCEL NON-BLD 5MMX100MML (ENDOMECHANICALS) IMPLANT
TROCAR Z THREAD OPTICAL 12X100 (TROCAR) IMPLANT
TROCAR Z-THREAD OPTICAL 5X100M (TROCAR) ×2 IMPLANT

## 2022-04-25 NOTE — Transfer of Care (Signed)
Immediate Anesthesia Transfer of Care Note  Patient: Tineka Uriegas  Procedure(s) Performed: LAPAROSCOPIC CHOLECYSTECTOMY WITH ICG DYE (Abdomen) INDOCYANINE GREEN FLUORESCENCE IMAGING (ICG)  Patient Location: PACU  Anesthesia Type:General  Level of Consciousness: awake, alert  and oriented  Airway & Oxygen Therapy: Patient Spontanous Breathing and Patient connected to face mask oxygen  Post-op Assessment: Report given to RN and Post -op Vital signs reviewed and stable  Post vital signs: Reviewed and stable  Last Vitals:  Vitals Value Taken Time  BP 182/61 04/25/22 0918  Temp    Pulse 73 04/25/22 0921  Resp 24 04/25/22 0921  SpO2 100 % 04/25/22 0921  Vitals shown include unvalidated device data.  Last Pain:  Vitals:   04/25/22 0549  TempSrc:   PainSc: 5       Patients Stated Pain Goal: 4 (23/53/61 4431)  Complications: No notable events documented.

## 2022-04-25 NOTE — Anesthesia Procedure Notes (Signed)
Procedure Name: Intubation Date/Time: 04/25/2022 7:59 AM  Performed by: Gean Maidens, CRNAPre-anesthesia Checklist: Patient identified, Emergency Drugs available, Suction available and Timeout performed Patient Re-evaluated:Patient Re-evaluated prior to induction Oxygen Delivery Method: Circle system utilized Preoxygenation: Pre-oxygenation with 100% oxygen Induction Type: IV induction Ventilation: Mask ventilation without difficulty Laryngoscope Size: Mac and 4 Grade View: Grade II Tube type: Oral Tube size: 7.0 mm Number of attempts: 1 Airway Equipment and Method: Stylet Placement Confirmation: ETT inserted through vocal cords under direct vision, positive ETCO2 and breath sounds checked- equal and bilateral Secured at: 21 cm Tube secured with: Tape Dental Injury: Teeth and Oropharynx as per pre-operative assessment

## 2022-04-25 NOTE — Op Note (Signed)
Kristy Flores [id number] 1956-04-27 04/25/2022  Laparoscopic Cholecystectomy with near infrared immunofluorescent cholangiography procedure Note  Indications: This patient presents with symptomatic gallbladder disease and will undergo laparoscopic cholecystectomy. Patient had previously undergone a ERCP with sphincterotomy due to choledocholithiasis.  She was referred to our office for consideration of cholecystectomy  Pre-operative Diagnosis: History of choledocholithiasis status post ERCP with sphincterotomy; symptomatic cholelithiasis  Post-operative Diagnosis: Same, chronic cholecystitis  Surgeon: Greer Pickerel MD FACS  Surgical Resident: Lossie Faes PGY-3  Anesthesia: General endotracheal anesthesia  Procedure Details  The patient was seen again in the Holding Room. The risks, benefits, complications, treatment options, and expected outcomes were discussed with the patient. The possibilities of reaction to medication, pulmonary aspiration, perforation of viscus, bleeding, recurrent infection, finding a normal gallbladder, the need for additional procedures, failure to diagnose a condition, the possible need to convert to an open procedure, and creating a complication requiring transfusion or operation were discussed with the patient. The likelihood of improving the patient's symptoms with return to their baseline status is good.  The patient and/or family concurred with the proposed plan, giving informed consent. The site of surgery properly noted. The patient was taken to Operating Room, identified as Kristy Flores and the procedure verified as Laparoscopic Cholecystectomy with ICG dye.  A Time Out was held and the above information confirmed. Antibiotic prophylaxis was administered.   Prior to the induction of general anesthesia, antibiotic prophylaxis was administered. General endotracheal anesthesia was then administered and tolerated well. After the induction, the abdomen was prepped with  Chloraprep and draped in the sterile fashion. The patient was positioned in the supine position.  Patient has a history of a lower midline incision and has severe diastases of her abdominal wall so I decided to gain access to the abdomen using the Optiview technique in the left upper quadrant.  A small incision was made in the left upper quadrant near Palmer's point.  A 0 degree 5 mm laparoscope through a 5 mm trocar was then advanced through the abdominal wall carefully and the abdominal cavity was entered.  Pneumoperitoneum was smoothly established up to a patient pressure of 15 mmHg without any change in patient vital signs.  The patient was placed in reverse Trendelenburg and rotated to the left.  There was some omental adhesions in the lower abdomen.  A 5 mm trocar was placed in the supraumbilical position.  Two 5-mm ports were placed in the right upper quadrant. All skin incisions were infiltrated with a local anesthetic agent before making the incision and placing the trocars.  The optical entry trocar was upsized to a 12 mm trocar.  The gallbladder was identified, the fundus grasped and retracted cephalad.  The patient had a severely contracted gallbladder.  There was a large stone in the dome of the gallbladder.  It was very difficult to grasp the fundus since the gallbladder was contracted around the gallstone.  I ended up exchanging the lateral 5 mm trocar for a 12 mm trocar so I could place a tenaculum grasper through it and grabbed the fundus of the gallbladder and lifted up.  Adhesions were lysed bluntly and with the electrocautery where indicated, taking care not to injure any adjacent organs or viscus. The infundibulum was grasped and retracted laterally, exposing the peritoneum overlying the triangle of Calot. This was then divided and exposed in a blunt fashion. A critical view of the cystic duct and cystic artery was obtained.  The cystic duct was clearly identified and bluntly  dissected  circumferentially.  Utilizing the Stryker camera system near infrared immunofluorescent activity was visualized in the liver, cystic duct, common hepatic duct and common bile duct and small bowel.  this served as a secondary confirmation of our anatomy.  The cystic duct was then ligated with clips and divided by the resident. The cystic artery which had been identified & dissected free was ligated with clips and divided as well.   The gallbladder was dissected from the liver bed in retrograde fashion with the electrocautery by the resident. The gallbladder was removed and placed in an Ecco sac.  Hemostasis was achieved with the electrocautery. Copious irrigation was utilized and was repeatedly aspirated until clear. The liver bed was irrigated and inspected.   I placed a piece of surgical snow in the gallbladder fossa.  The gallbladder and Ecco sac were then removed through the left upper quadrant port site.  The left upper quadrant port site was closed with 3 interrupted 0 Vicryl's using a PMI suture passer with laparoscopic guidance.  There was no air leak.  There was nothing trapped within our closure.  Additional local was infiltrated.  We again inspected the right upper quadrant for hemostasis. . Pneumoperitoneum was released as we removed the trocars.  4-0 Monocryl was used to close the skin.    steri-strips, and clean dressings were applied. The patient was then extubated and brought to the recovery room in stable condition. Instrument, sponge, and needle counts were correct at closure and at the conclusion of the case.   Findings: Chronic cholecystitis with Cholelithiasis Contracted gallbladder Near infrared fluorescent activity seen within the cystic duct, common hepatic duct, common bile duct and small bowel Positive snow  Estimated Blood Loss: Minimal         Drains: none         Specimens: Gallbladder           Complications: None; patient tolerated the procedure well.          Disposition: PACU - hemodynamically stable.         Condition: stable  I was personally present and scrubbed and performed the dissection of the cystic duct and cystic artery and I was present for the remainder of the case except for skin closure but was immediately available, as documented in my operative note.   Leighton Ruff. Redmond Pulling, MD, FACS General, Bariatric, & Minimally Invasive Surgery [facility],  [place]

## 2022-04-25 NOTE — Anesthesia Preprocedure Evaluation (Addendum)
Anesthesia Evaluation  Patient identified by MRN, date of birth, ID band Patient awake    Reviewed: Allergy & Precautions, NPO status , Patient's Chart, lab work & pertinent test results  Airway Mallampati: II  TM Distance: >3 FB Neck ROM: Full    Dental  (+) Teeth Intact, Dental Advisory Given   Pulmonary    breath sounds clear to auscultation       Cardiovascular hypertension, Pt. on medications  Rhythm:Regular Rate:Normal     Neuro/Psych  Headaches, PSYCHIATRIC DISORDERS Anxiety Depression    GI/Hepatic negative GI ROS,   Endo/Other  diabetes, Type 2, Oral Hypoglycemic Agents  Renal/GU      Musculoskeletal negative musculoskeletal ROS (+)   Abdominal Normal abdominal exam  (+)   Peds  Hematology negative hematology ROS (+)   Anesthesia Other Findings   Reproductive/Obstetrics                            Anesthesia Physical Anesthesia Plan  ASA: 3  Anesthesia Plan: General   Post-op Pain Management: Tylenol PO (pre-op)*   Induction: Intravenous  PONV Risk Score and Plan: 4 or greater and Ondansetron, Dexamethasone, Midazolam and Scopolamine patch - Pre-op  Airway Management Planned: Oral ETT  Additional Equipment: None  Intra-op Plan:   Post-operative Plan: Extubation in OR  Informed Consent: I have reviewed the patients History and Physical, chart, labs and discussed the procedure including the risks, benefits and alternatives for the proposed anesthesia with the patient or authorized representative who has indicated his/her understanding and acceptance.     Dental advisory given  Plan Discussed with: CRNA  Anesthesia Plan Comments:        Anesthesia Quick Evaluation

## 2022-04-25 NOTE — Discharge Instructions (Signed)
[facility] LAPAROSCOPIC SURGERY: POST OP INSTRUCTIONS Always review your discharge instruction sheet given to you by the facility where your surgery was performed. IF YOU HAVE DISABILITY OR FAMILY LEAVE FORMS, YOU MUST BRING THEM TO THE OFFICE FOR PROCESSING.   DO NOT GIVE THEM TO YOUR DOCTOR.  PAIN CONTROL  First take acetaminophen (Tylenol) AND/or ibuprofen (Advil) to control your pain after surgery.  Follow directions on package.  Taking acetaminophen (Tylenol) and/or ibuprofen (Advil) regularly after surgery will help to control your pain and lower the amount of prescription pain medication you may need.  You should not take more than 3,000 mg (3 grams) of acetaminophen (Tylenol) in 24 hours.  You should not take ibuprofen (Advil), aleve, motrin, naprosyn or other NSAIDS if you have a history of stomach ulcers or chronic kidney disease.  A prescription for pain medication may be given to you upon discharge.  Take your pain medication as prescribed, if you still have uncontrolled pain after taking acetaminophen (Tylenol) or ibuprofen (Advil). Use ice packs to help control pain. If you need a refill on your pain medication, please contact your pharmacy.  They will contact our office to request authorization. Prescriptions will not be filled after 5pm or on week-ends.  HOME MEDICATIONS Take your usually prescribed medications unless otherwise directed.  DIET You should follow a light diet the first few days after arrival home.  Be sure to include lots of fluids daily. Avoid fatty, fried foods.   CONSTIPATION It is common to experience some constipation after surgery and if you are taking pain medication.  Increasing fluid intake and taking a stool softener (such as Colace) will usually help or prevent this problem from occurring.  A mild laxative (Milk of Magnesia or Miralax) should be taken according to package instructions if there are no bowel movements after 48  hours.  WOUND/INCISION CARE Most patients will experience some swelling and bruising in the area of the incisions.  Ice packs will help.  Swelling and bruising can take several days to resolve.  Unless discharge instructions indicate otherwise, follow guidelines below  STERI-STRIPS - you may remove your outer bandages 48 hours after surgery, and you may shower at that time.  You have steri-strips (small skin tapes) in place directly over the incision.  These strips should be left on the skin for 7-10 days.   DERMABOND/SKIN GLUE - you may shower in 24 hours.  The glue will flake off over the next 2-3 weeks. Any sutures or staples will be removed at the office during your follow-up visit.  ACTIVITIES You may resume regular (light) daily activities beginning the next day--such as daily self-care, walking, climbing stairs--gradually increasing activities as tolerated.  You may have sexual intercourse when it is comfortable.  Refrain from any heavy lifting or straining until approved by your doctor. You may drive when you are no longer taking prescription pain medication, you can comfortably wear a seatbelt, and you can safely maneuver your car and apply brakes.  FOLLOW-UP You should see your doctor in the office for a follow-up appointment approximately 2-3 weeks after your surgery.  You should have been given your post-op/follow-up appointment when your surgery was scheduled.  If you did not receive a post-op/follow-up appointment, make sure that you call for this appointment within a day or two after you arrive home to insure a convenient appointment time.  OTHER INSTRUCTIONS   WHEN TO CALL YOUR DOCTOR: Fever over 101.0 Inability to urinate Continued   bleeding from incision. Increased pain, redness, or drainage from the incision. Increasing abdominal pain  The clinic staff is available to answer your questions during regular business hours.  Please don't hesitate to call and ask to speak to  one of the nurses for clinical concerns.  If you have a medical emergency, go to the nearest emergency room or call 911.  A surgeon from [facility] is always on call at the hospital. [address], [address], [place], [place]  [address] ? [address] [address], [place], [place]   [address] [phone] ? [phone] ? FAX [phone] Web site: [email or web address]  

## 2022-04-25 NOTE — H&P (Signed)
CC: herefor surgery  Requesting provider: n/a  HPI: Kristy Flores is an 66 y.o. female who is here for lap chole with icg,possioc.denieschanges sinceseen in clinic.   Old hpi She is referred by Dr. Ardis Hughs because she underwent imaging that showed gallstones as well as choledocholithiasis. She underwent ERCP last month and had a stone extraction and sphincterotomy. She is referred here to discuss cholecystectomy. She states that she has known she has had gallstones since she was in her teen years. She had 1 bad attack back in her teenage years since then her attacks have been very intermittently. Since undergoing ERCP she has had a few attacks which have been transient. She describes her attacks as epigastric radiating to her right side. They are uncomfortable and last generally less than an hour. Not really associate with nausea or vomiting. She has been mindful of avoiding certain foods. No melena or hematochezia or acholic stools. Prior abdominal surgery was an abdominal hysterectomy. Takes medicine for diabetes and has chronic kidney disease but is not on dialysis. Makes urine  Past Medical History:  Diagnosis Date   Anemia    Anxiety    Depression    Diabetes mellitus without complication ([place])    Headache    Hyperlipidemia    Hypertension    Insomnia    Melanoma ([place])    Back   Pneumonia    Restless leg syndrome     Past Surgical History:  Procedure Laterality Date   ABDOMINAL HYSTERECTOMY     CESAREAN SECTION     ERCP N/A 02/13/2022   Procedure: ENDOSCOPIC RETROGRADE CHOLANGIOPANCREATOGRAPHY (ERCP);  Surgeon: Milus Banister, MD;  Location: [facility];  Service: Gastroenterology;  Laterality: N/A;   MELANOMA EXCISION  1985   REMOVAL OF STONES  02/13/2022   Procedure: REMOVAL OF STONES;  Surgeon: Milus Banister, MD;  Location: [facility];  Service: Gastroenterology;;   Joan Mayans  02/13/2022   Procedure: Joan Mayans;  Surgeon: Milus Banister, MD;  Location:  [facility];  Service: Gastroenterology;;   UPPER GI ENDOSCOPY      Family History  Problem Relation Age of Onset   Kidney disease Mother    Diabetes Mother    Hypertension Mother    Cancer Father     Social:  reports that she has never smoked. She has never used smokeless tobacco. She reports that she does not currently use alcohol. She reports that she does not currently use drugs.  Allergies:  Allergies  Allergen Reactions   Chlorzoxazone Nausea And Vomiting   Floxuridine     Hypoglycemia / pass out   Codeine Nausea And Vomiting   Gabapentin Swelling    AKI   Nsaids Other (See Comments)    CKD   Penicillins     Broke out, stomach upset.    Victoza [Liraglutide]     Shut down kidneys     Medications: I have reviewed the patient's current medications.   ROS - all of the below systems have been reviewed with the patient and positives are indicated with bold text General: chills, fever or night sweats Eyes: blurry vision or double vision ENT: epistaxis or sore throat Allergy/Immunology: itchy/watery eyes or nasal congestion Hematologic/Lymphatic: bleeding problems, blood clots or swollen lymph nodes Endocrine: temperature intolerance or unexpected weight changes Breast: new or changing breast lumps or nipple discharge Resp: cough, shortness of breath, or wheezing CV: chest pain or dyspnea on exertion GI: as per HPI GU: dysuria, trouble voiding, or hematuria MSK: joint  pain or joint stiffness Neuro: TIA or stroke symptoms Derm: pruritus and skin lesion changes Psych: anxiety and depression  PE Blood pressure (!) 186/76, pulse 80, temperature 98.3 F (36.8 C), temperature source Oral, resp. rate 17, height '5\' 1"'$  (1.549 m), weight 102.1 kg, SpO2 97 %. Constitutional: NAD; conversant; no deformities Eyes: Moist conjunctiva; no lid lag; anicteric; PERRL Neck: Trachea midline; no thyromegaly Lungs: Normal respiratory effort; no tactile fremitus CV: RRR; no palpable  thrills; no pitting edema GI: Abd soft, truncal obesity, lower midline incision central abdominal wide bulge most consistent with either severe diastases or potentially loss of domain; no scar over the central abdominal bulge;; no palpable hepatosplenomegaly MSK: Normal gait; no clubbing/cyanosis Psychiatric: Appropriate affect; alert and oriented x3 Lymphatic: No palpable cervical or axillary lymphadenopathy Skin:no rash  Results for orders placed or performed during the hospital encounter of 04/25/22 (from the past 48 hour(s))  Glucose, capillary     Status: Abnormal   Collection Time: 04/25/22  5:44 AM  Result Value Ref Range   Glucose-Capillary 127 (H) 70 - 99 mg/dL    Comment: Glucose reference range applies only to samples taken after fasting for at least 8 hours.    No results found.  Imaging: reviewed  A/P: Kristy Flores is an 66 y.o. female with  Symptomatic cholelithiasis  Primary hypertension  Hyperlipidemia, unspecified hyperlipidemia type  Chronic kidney disease (CKD) stage G3b/A1, moderately decreased glomerular filtration rate (GFR) between 30-44 mL/min/1.73 square meter and albuminuria creatinine ratio less than 30 mg/g (CMS-HCC)  S/P ERCP  Diastasis recti  TOor for lap chole with icg, poss ioc  All questions asked and answered   Discussed surgical resident in volvementin case  Leighton Ruff. Redmond Pulling, MD, FACS General, Bariatric, & Minimally Invasive Surgery [facility]

## 2022-04-25 NOTE — Anesthesia Postprocedure Evaluation (Signed)
Anesthesia Post Note  Patient: Kristy Flores  Procedure(s) Performed: LAPAROSCOPIC CHOLECYSTECTOMY WITH ICG DYE (Abdomen) INDOCYANINE GREEN FLUORESCENCE IMAGING (ICG)     Patient location during evaluation: PACU Anesthesia Type: General Level of consciousness: awake and alert Pain management: pain level controlled Vital Signs Assessment: post-procedure vital signs reviewed and stable Respiratory status: spontaneous breathing, nonlabored ventilation, respiratory function stable and patient connected to nasal cannula oxygen Cardiovascular status: blood pressure returned to baseline and stable Postop Assessment: no apparent nausea or vomiting Anesthetic complications: no   No notable events documented.  Last Vitals:  Vitals:   04/25/22 1200 04/25/22 1215  BP: (!) 182/85 (!) 178/84  Pulse: 70 74  Resp:    Temp:    SpO2: 95% 91%    Last Pain:  Vitals:   04/25/22 1215  TempSrc:   PainSc: 3                  Effie Berkshire

## 2022-04-26 ENCOUNTER — Encounter (HOSPITAL_COMMUNITY): Payer: Self-pay | Admitting: General Surgery

## 2022-04-28 LAB — SURGICAL PATHOLOGY

## 2022-05-02 ENCOUNTER — Other Ambulatory Visit: Payer: Self-pay | Admitting: Family Medicine

## 2022-05-02 ENCOUNTER — Other Ambulatory Visit: Payer: Self-pay

## 2022-05-02 DIAGNOSIS — M1A09X0 Idiopathic chronic gout, multiple sites, without tophus (tophi): Secondary | ICD-10-CM

## 2022-05-02 DIAGNOSIS — G894 Chronic pain syndrome: Secondary | ICD-10-CM

## 2022-05-02 MED ORDER — ALLOPURINOL 100 MG PO TABS
200.0000 mg | ORAL_TABLET | Freq: Every day | ORAL | 2 refills | Status: DC
  Filled 2022-05-02: qty 60, 30d supply, fill #0
  Filled 2022-06-02: qty 60, 30d supply, fill #1
  Filled 2022-07-03: qty 60, 30d supply, fill #2

## 2022-05-02 MED ORDER — DULOXETINE HCL 30 MG PO CPEP
30.0000 mg | ORAL_CAPSULE | Freq: Two times a day (BID) | ORAL | 2 refills | Status: DC
  Filled 2022-05-02: qty 60, 30d supply, fill #0
  Filled 2022-06-11: qty 60, 30d supply, fill #1

## 2022-05-07 ENCOUNTER — Telehealth: Payer: Self-pay | Admitting: Surgery

## 2022-05-09 ENCOUNTER — Other Ambulatory Visit (HOSPITAL_COMMUNITY): Payer: Self-pay | Admitting: Student

## 2022-05-09 ENCOUNTER — Other Ambulatory Visit: Payer: Self-pay

## 2022-05-09 DIAGNOSIS — R1011 Right upper quadrant pain: Secondary | ICD-10-CM

## 2022-05-09 MED ORDER — OXYCODONE HCL 5 MG PO TABS
5.0000 mg | ORAL_TABLET | ORAL | 0 refills | Status: DC | PRN
  Filled 2022-05-09: qty 5, 1d supply, fill #0

## 2022-05-09 MED ORDER — CIPROFLOXACIN HCL 500 MG PO TABS
500.0000 mg | ORAL_TABLET | Freq: Two times a day (BID) | ORAL | 0 refills | Status: DC
  Filled 2022-05-09: qty 14, 7d supply, fill #0

## 2022-05-09 MED ORDER — METRONIDAZOLE 500 MG PO TABS
500.0000 mg | ORAL_TABLET | Freq: Three times a day (TID) | ORAL | 0 refills | Status: DC
  Filled 2022-05-09: qty 21, 7d supply, fill #0

## 2022-05-10 ENCOUNTER — Ambulatory Visit (HOSPITAL_COMMUNITY)
Admission: RE | Admit: 2022-05-10 | Discharge: 2022-05-10 | Disposition: A | Discharge status: Home / Self Care | Payer: Commercial Managed Care - HMO | Source: Ambulatory Visit | Attending: Student | Admitting: Student

## 2022-05-10 DIAGNOSIS — R1011 Right upper quadrant pain: Secondary | ICD-10-CM | POA: Insufficient documentation

## 2022-05-10 MED ORDER — IOHEXOL 9 MG/ML PO SOLN
ORAL | Status: AC
  Filled 2022-05-10: qty 1000

## 2022-05-14 ENCOUNTER — Other Ambulatory Visit: Payer: Self-pay

## 2022-05-14 MED ORDER — CIPROFLOXACIN HCL 500 MG PO TABS
500.0000 mg | ORAL_TABLET | Freq: Two times a day (BID) | ORAL | 0 refills | Status: DC
  Filled 2022-05-14: qty 14, 7d supply, fill #0

## 2022-05-14 MED ORDER — METRONIDAZOLE 500 MG PO TABS
500.0000 mg | ORAL_TABLET | Freq: Three times a day (TID) | ORAL | 0 refills | Status: DC
  Filled 2022-05-14: qty 21, 7d supply, fill #0

## 2022-05-20 ENCOUNTER — Other Ambulatory Visit: Payer: Self-pay

## 2022-05-20 ENCOUNTER — Other Ambulatory Visit: Payer: Self-pay | Admitting: Family Medicine

## 2022-05-20 MED ORDER — FUROSEMIDE 20 MG PO TABS
20.0000 mg | ORAL_TABLET | ORAL | 1 refills | Status: DC
  Filled 2022-05-20: qty 15, 30d supply, fill #0
  Filled 2022-06-19: qty 15, 30d supply, fill #1

## 2022-05-27 NOTE — Telephone Encounter (Signed)
Error

## 2022-06-02 ENCOUNTER — Other Ambulatory Visit: Payer: Self-pay

## 2022-06-03 ENCOUNTER — Other Ambulatory Visit: Payer: Self-pay

## 2022-06-11 ENCOUNTER — Other Ambulatory Visit: Payer: Self-pay

## 2022-06-17 ENCOUNTER — Other Ambulatory Visit: Payer: Self-pay | Admitting: Obstetrics and Gynecology

## 2022-06-17 DIAGNOSIS — N819 Female genital prolapse, unspecified: Secondary | ICD-10-CM

## 2022-06-19 ENCOUNTER — Other Ambulatory Visit: Payer: Self-pay

## 2022-06-19 ENCOUNTER — Ambulatory Visit: Payer: Commercial Managed Care - HMO | Admitting: Obstetrics and Gynecology

## 2022-06-19 ENCOUNTER — Ambulatory Visit: Payer: Commercial Managed Care - HMO | Admitting: Clinical

## 2022-06-19 ENCOUNTER — Ambulatory Visit: Payer: Self-pay | Admitting: *Deleted

## 2022-06-19 ENCOUNTER — Encounter: Payer: Self-pay | Admitting: Obstetrics and Gynecology

## 2022-06-19 ENCOUNTER — Other Ambulatory Visit (HOSPITAL_COMMUNITY)
Admission: RE | Admit: 2022-06-19 | Discharge: 2022-06-19 | Disposition: A | Discharge status: Home / Self Care | Payer: Commercial Managed Care - HMO | Source: Ambulatory Visit | Attending: Obstetrics and Gynecology | Admitting: Obstetrics and Gynecology

## 2022-06-19 VITALS — BP 185/75 | HR 73 | Wt 218.9 lb

## 2022-06-19 DIAGNOSIS — F4321 Adjustment disorder with depressed mood: Secondary | ICD-10-CM

## 2022-06-19 DIAGNOSIS — F411 Generalized anxiety disorder: Secondary | ICD-10-CM

## 2022-06-19 DIAGNOSIS — L292 Pruritus vulvae: Secondary | ICD-10-CM

## 2022-06-19 DIAGNOSIS — F332 Major depressive disorder, recurrent severe without psychotic features: Secondary | ICD-10-CM

## 2022-06-19 MED ORDER — CLOTRIMAZOLE-BETAMETHASONE 1-0.05 % EX CREA
1.0000 | TOPICAL_CREAM | Freq: Two times a day (BID) | CUTANEOUS | 0 refills | Status: DC
  Filled 2022-06-19: qty 30, 30d supply, fill #0

## 2022-06-19 NOTE — Progress Notes (Signed)
Roselyn Reef in to see pt

## 2022-06-19 NOTE — Telephone Encounter (Signed)
  Chief Complaint: elevated BP 185/74 while at [facility] office today. No hx HTN Symptoms: headaches everyday Frequency: today and since having gallbladder surgery  Pertinent Negatives: Patient denies chest pain no difficulty breathing no blurred vision, no balance issues, no weakness on either side of body no N/T. Disposition: '[x]'$ ED /'[]'$ Urgent Care (no appt availability in office) / '[]'$ Appointment(In office/virtual)/ '[]'$  [facility]/ '[]'$ Home Care/ '[x]'$ Refused Recommended Disposition /'[]'$ [facility]/ '[]'$  Follow-up with PCP Additional Notes:   Recommended ED due to protocol and no hx HTN. Patient does not want to go to ED. Recommended to go to [facility] to have BP rechecked if she does not want to go to ED. Please advise.     Reason for Disposition  [5] Systolic BP  >= 038 OR Diastolic >= 882 AND [8] cardiac (e.g., breathing difficulty, chest pain) or neurologic symptoms (e.g., new-onset blurred or double vision, unsteady gait)  Answer Assessment - Initial Assessment Questions 1. BLOOD PRESSURE: "What is the blood pressure?" "Did you take at least two measurements 5 minutes apart?"     BP 185/74 at Dr office  2. ONSET: "When did you take your blood pressure?"     Today in Dr. Gabriel Carina told to call PCP 3. HOW: "How did you take your blood pressure?" (e.g., automatic home BP monitor, visiting nurse)     At Dr. Gabriel Carina  4. HISTORY: "Do you have a history of high blood pressure?"     No  5. MEDICINES: "Are you taking any medicines for blood pressure?" "Have you missed any doses recently?"     No but is taking lisinopril  6. OTHER SYMPTOMS: "Do you have any symptoms?" (e.g., blurred vision, chest pain, difficulty breathing, headache, weakness)     Headaches, everyday does not have BP monitor  7. PREGNANCY: "Is there any chance you are pregnant?" "When was your last menstrual period?"     na  Protocols used: Blood Pressure - High-A-AH

## 2022-06-19 NOTE — Telephone Encounter (Signed)
Pt has mobile bus address and  didn't state if she will go or not.

## 2022-06-19 NOTE — Addendum Note (Signed)
Addended by: Cindi Carbon on: 06/19/2022 08:37 AM   Modules accepted: Orders

## 2022-06-19 NOTE — Patient Instructions (Addendum)
Healthy vulval hygiene practices Avoid Substitute  Clothing  Pantyhose Thigh-high or knee-high stockings Stay-up thigh-high stockings with silicone adhesive strips Stockings with a garter belt  Synthetic underwear Cotton underwear or no underwear  Jeans and other tight pants Loose pants, skirts, dresses  Swimsuits, leotards, thongs, Lycra garments for prolonged periods Loose-fitting cotton garments or prompt removal of swimsuits, leotards, etc, after completion of exercise activity  Cleansing products  Scented soaps or shampoos Fragrance-free, pH-neutral, soap-free cleanser or bland emollient moisturizer  Bubble bath Warm (not hot) plain water bath once daily. A bland, unfragranced, pH-neutral emollient moisturizer can be used as a bath additive.  Scented detergents Unscented detergents  Baby wipes or flushable wipes Rinse with water using sports water bottle or perineal irrigation bottle  Feminine sprays, douches, powders These are not necessary products and can be omitted from personal practices  Other  Washcloths Use fingertips for washing; pat dry, do not rub dry  Panty liners Tampons, cotton pads, washable cotton-based "period underwear," menstrual cups  Dyed toilet articles Toilet articles without dyes  Hair dryers to dry vulva skin without contact Dry vulva by gentle patting with a soft towel  Graphic 64538 Version 8.0  2023 UpToDate, Inc. and/or its affiliates. All Rights Reserved.    [facility] [place], [place] [address] [phone] (main office) [phone] [facility] office)  [facility]  [address] [place], [place] [address] [phone] or [phone] [facility] 24/7 FOR ANYONE [address] [place], [place] Fax: [phone] [email or web address] *Interpreters available *Accepts all insurance and uninsured for Urgent Care needs *Accepts Medicaid and  uninsured for outpatient treatment (below)    ONLY FOR [facility]  Below:   Outpatient New Patient Assessment/Therapy Walk-ins:        Monday -Thursday 8am until slots are full.        Every Friday 1pm-4pm  (first come, first served)                   New Patient Psychiatry/Medication Management        Monday-Friday 8am-11am (first come, first served)              For all walk-ins we ask that you arrive by 7:15am, because patients will be seen in the order of arrival.    [employer] and Websites Here are a few free apps meant to help you to help yourself.  To find, try searching on the internet to see if the app is offered on Apple/Android devices. If your first choice doesn't come up on your device, the good news is that there are many choices! Play around with different apps to see which ones are helpful to you.    Calm This is an app meant to help increase calm feelings. Includes info, strategies, and tools for tracking your feelings.      Calm Harm  This app is meant to help with self-harm. Provides many 5-minute or 15-min coping strategies for doing instead of hurting yourself.       [place] is a problem-solving tool to help deal with emotions and cope with stress you encounter wherever you are.      MindShift This app can help people cope with anxiety. Rather than trying to avoid anxiety, you can make an important shift and face it.      MY3  MY3 features a support system, safety plan and  resources with the goal of offering a tool to use in a time of need.       My Life My Voice  This mood journal offers a simple solution for tracking your thoughts, feelings and moods. Animated emoticons can help identify your mood.       Relax Melodies Designed to help with sleep, on this app you can mix sounds and meditations for relaxation.      Smiling Mind Smiling Mind is meditation made easy: it's a simple tool that  helps put a smile on your mind.        Stop, Breathe & Think  A friendly, simple guide for people through meditations for mindfulness and compassion.  Stop, Breathe and Think Kids Enter your current feelings and choose a "mission" to help you cope. Offers videos for certain moods instead of just sound recordings.       Team Orange The goal of this tool is to help teens change how they think, act, and react. This app helps you focus on your own good feelings and experiences.      The [place] Box The [place] Box (VHB) contains simple tools to help patients with coping, relaxation, distraction, and positive thinking.

## 2022-06-19 NOTE — Progress Notes (Signed)
NEW GYNECOLOGY PATIENT Patient name: Kristy Flores MRN [id number]  Date of birth: 01-23-1956 Chief Complaint:   Vaginal Bumps     History:  Kristy Flores is a 66 y.o.  being seen today for concern for vaginal bumps.    Recent cholecystectomy with new vaginal bumps noted afterwards. Initially noticed a bump following gallstone removal and then more following gallbladder removal. First noticed itching then the bumps. No other skin changes anywhere else. Bumps noted on bilateral labia majora and between labial folds. No abnormal discharge. No sharp or shooting pain in vulva. Has never had anything like this after prior surgeries. Has not noticed increase sweating. Uses ivory soap to clean herself. She typically shaves to remove pubic hair. Denies fever and chills.   Patient disclosed to RN that she has SI during initial intake - Roselyn Reef LCSW        Gynecologic History No LMP recorded. Patient has had a hysterectomy. Contraception: status post hysterectomy Last Pap: n/a Last Mammogram: ordered Last Colonoscopy:   Obstetric History OB History  No obstetric history on file.    Past Medical History:  Diagnosis Date   Anemia    Anxiety    Depression    Diabetes mellitus without complication ([place])    Headache    Hyperlipidemia    Hypertension    Insomnia    Melanoma ([place])    Back   Pneumonia    Restless leg syndrome     Past Surgical History:  Procedure Laterality Date   ABDOMINAL HYSTERECTOMY     CESAREAN SECTION     CHOLECYSTECTOMY N/A 04/25/2022   Procedure: LAPAROSCOPIC CHOLECYSTECTOMY WITH ICG DYE;  Surgeon: Greer Pickerel, MD;  Location: [facility];  Service: General;  Laterality: N/A;   ERCP N/A 02/13/2022   Procedure: ENDOSCOPIC RETROGRADE CHOLANGIOPANCREATOGRAPHY (ERCP);  Surgeon: Milus Banister, MD;  Location: [facility];  Service: Gastroenterology;  Laterality: N/A;   MELANOMA EXCISION  1985   REMOVAL OF STONES  02/13/2022   Procedure: REMOVAL OF STONES;  Surgeon:  Milus Banister, MD;  Location: [facility];  Service: Gastroenterology;;   Joan Mayans  02/13/2022   Procedure: Joan Mayans;  Surgeon: Milus Banister, MD;  Location: [facility];  Service: Gastroenterology;;   UPPER GI ENDOSCOPY      Current Outpatient Medications on File Prior to Visit  Medication Sig Dispense Refill   acetaminophen (TYLENOL) 500 MG tablet Take 1,000 mg by mouth every 6 (six) hours as needed for moderate pain.     allopurinol (ZYLOPRIM) 100 MG tablet Take 2 tablets (200 mg total) by mouth daily. 60 tablet 2   DULoxetine (CYMBALTA) 30 MG capsule Take 1 capsule (30 mg total) by mouth 2 (two) times daily. 60 capsule 2   furosemide (LASIX) 20 MG tablet Take 1 tablet (20 mg total) by mouth every other day. 15 tablet 1   glimepiride (AMARYL) 4 MG tablet Take 1 tablet (4 mg total) by mouth daily before breakfast. 90 tablet 1   Insulin Pen Needle (B-D ULTRAFINE III SHORT PEN) 31G X 8 MM MISC USE AS INSTRUCTED. INJECT INTO THE SKIN ONCE NIGHTLY. 100 each 1   lisinopril (ZESTRIL) 10 MG tablet Take 1 tablet (10 mg total) by mouth daily. 90 tablet 1   lovastatin (MEVACOR) 40 MG tablet Take 1 tablet (40 mg total) by mouth at bedtime. 90 tablet 2   sodium bicarbonate 650 MG tablet Take 1 tablet (650 mg total) by mouth 2 (two) times daily. 60 tablet 11  TRUEplus Lancets 28G MISC Use as instructed. Check blood glucose level by fingerstick twice per day. 100 each 3   Blood Glucose Monitoring Suppl (TRUE METRIX METER) w/Device KIT Use as instructed. Check blood glucose level by fingerstick twice per day. 1 kit 0   ciprofloxacin (CIPRO) 500 MG tablet Take 1 tablet (500 mg total) by mouth 2 (two) times daily for 7 days 14 tablet 0   Insulin Glargine (BASAGLAR KWIKPEN) 100 UNIT/ML Inject 15 Units into the skin at bedtime. 3 mL 6   metroNIDAZOLE (FLAGYL) 500 MG tablet Take 1 tablet (500 mg total) by mouth 3 (three) times daily. (Patient not taking: Reported on 06/19/2022) 21 tablet 0    oxyCODONE (OXY IR/ROXICODONE) 5 MG immediate release tablet Take 1 tablet (5 mg total) by mouth every 6 (six) hours as needed for severe pain. (Patient not taking: Reported on 06/19/2022) 15 tablet 0   oxyCODONE (OXY IR/ROXICODONE) 5 MG immediate release tablet Take 1 tablet (5 mg total) by mouth every 4 (four) hours as needed for Pain (Patient not taking: Reported on 06/19/2022) 5 tablet 0   Vitamin D, Ergocalciferol, (DRISDOL) 1.25 MG (50000 UNIT) CAPS capsule Take 1 capsule (50,000 Units total) by mouth every 7 (seven) days. (Patient not taking: Reported on 06/19/2022) 12 capsule 1   [DISCONTINUED] citalopram (CELEXA) 20 MG tablet Take 1 tablet (20 mg total) by mouth daily. (Patient not taking: Reported on 09/05/2020) 90 tablet 0   No current facility-administered medications on file prior to visit.    Allergies  Allergen Reactions   Chlorzoxazone Nausea And Vomiting   Floxuridine     Hypoglycemia / pass out   Codeine Nausea And Vomiting   Gabapentin Swelling    AKI   Nsaids Other (See Comments)    CKD   Penicillins     Broke out, stomach upset.    Victoza [Liraglutide]     Shut down kidneys     Social History:  reports that she has never smoked. She has never used smokeless tobacco. She reports that she does not currently use alcohol. She reports that she does not currently use drugs.  Family History  Problem Relation Age of Onset   Kidney disease Mother    Diabetes Mother    Hypertension Mother    Cancer Father     The following portions of the patient's history were reviewed and updated as appropriate: allergies, current medications, past family history, past medical history, past social history, past surgical history and problem list.  Review of Systems Pertinent items noted in HPI and remainder of comprehensive ROS otherwise negative.  Physical Exam:  BP (!) 185/75   Pulse 73   Wt 218 lb 14.4 oz (99.3 kg)   BMI 41.36 kg/m  Physical Exam Vitals and nursing note  reviewed. Exam conducted with a chaperone present.  Constitutional:      Appearance: Normal appearance.  Pulmonary:     Effort: Pulmonary effort is normal.  Genitourinary:      Comments: Multiple small inclusion cysts within bilateral labia minora 34m slightly erythematous region on right lower labia majora, nontender, similar one on contralateral labia with evidence of prior skin break Slight erythema on labia majora though not clearly demarcated Nonteder throughout No lesions noted at introitus or perineum No skin changes at perineum or perianal skin  Neurological:     General: No focal deficit present.     Mental Status: She is alert and oriented to person, place, and time.  Psychiatric:  Mood and Affect: Mood normal.        Behavior: Behavior normal.        Thought Content: Thought content normal.        Judgment: Judgment normal.      Assessment and Plan:   1. Vulvar itching Small areas on vulva likely erythematous due to excoriations with benign appearing inclusion cyst. Swabs collected to assess for possible infection. Ddx includes yeast, atopic dermatitis, early lichen simplex. Trial of lotrisone and return in 4-6 weeks to assess symptoms  - clotrimazole-betamethasone (LOTRISONE) cream; Apply 1 Application topically 2 (two) times daily.  Dispense: 30 g; Refill: 0   Routine preventative health maintenance measures emphasized. Please refer to After Visit Summary for other counseling recommendations.      Darliss Cheney, MD Obstetrician & Gynecologist, Faculty Practice [facility] Gynec[facility] for [employer], [place]

## 2022-06-19 NOTE — BH Specialist Note (Signed)
Integrated Behavioral Health via Telemedicine Visit  06/19/2022 Kristy Flores [id number]  Number of [place] Clinician visits: 1- Initial Visit  Session Start time: [id number]   Session End time: 0909  Total time in minutes: 15   Referring Provider: Margart Sickles, MD Patient/Family location: [facility] Provider location: [facility] [place] [facility]  All persons participating in visit: Patient Kristy Flores and Kristy Flores   Types of Service: Individual psychotherapy  I connected with Kristy Flores and/or Kristy Flores's  n/a  via  Telephone or Video Enabled Telemedicine Application  (Video is Caregility application) and verified that I am speaking with the correct person using two identifiers. Discussed confidentiality: Yes   I discussed the limitations of telemedicine and the availability of in person appointments.  Discussed there is a possibility of technology failure and discussed alternative modes of communication if that failure occurs.  I discussed that engaging in this telemedicine visit, they consent to the provision of behavioral healthcare and the services will be billed under their insurance.  Patient and/or legal guardian expressed understanding and consented to Telemedicine visit: Yes   Presenting Concerns: Patient and/or family reports the following symptoms/concerns: Depression, anxiety, grief (lost adult son about 1.5 years ago), insomnia, and life stress; took Xanax to cope with anxiety for years, has not taken in a long time; declines referral to psychiatry, declines medication at this time; open to brief therapy. Pt experiences passive SI with no intent and no plan; looks forward to time with grandchildren daily.  Duration of problem: Increase symptoms after loss of son; Severity of problem: severe  Patient and/or Family's Strengths/Protective Factors: Concrete supports in place (healthy food, safe  environments, etc.) and Sense of purpose  Goals Addressed: Patient will:  Reduce symptoms of: anxiety, depression, insomnia, and stress   Safety  Progress towards Goals: Ongoing  Interventions: Interventions utilized:  Supportive Counseling, Functional Assessment of ADLs, and Safety Standardized Assessments completed: GAD-7 and PHQ 9  Patient and/or Family Response: Patient agrees with treatment plan.   Assessment: Patient currently experiencing Major depressive disorder, currently active, recurrent, without psychotic features and Generalized anxiety disorder   Patient may benefit from psychoeducation and brief therapeutic interventions regarding coping with symptoms of anxiety, depression, life stress, grief .  Plan: Follow up with behavioral health clinician on : Two weeks Behavioral recommendations:  -Continue spending time with grandchildren daily  -Use [facility] as needed (information on After Visit Summary medical visit today) -Continue using fan sound at night to help with sleep; consider additional sleep sounds as needed for improved sleep (will discuss additional strategies at follow up visit) Referral(s): Integrated Orthoptist (In Clinic) and [place] (LME/Outside Clinic)  I discussed the assessment and treatment plan with the patient and/or parent/guardian. They were provided an opportunity to ask questions and all were answered. They agreed with the plan and demonstrated an understanding of the instructions.   They were advised to call back or seek an in-person evaluation if the symptoms worsen or if the condition fails to improve as anticipated.  Kristy Fair, LCSW     06/19/2022    8:43 AM 04/14/2022    9:30 AM 01/10/2022    3:50 PM 10/09/2021    4:18 PM 03/15/2021    4:14 PM  Depression screen PHQ 2/9  Decreased Interest '1 1 1 3 2  '$ Down, Depressed, Hopeless '2 2 1 3 3  '$ PHQ - 2 Score 3  $'3 2 6 5  'm$ Altered  sleeping '3 3 1 3 3  '$ Tired, decreased energy '3 3 3 3 3  '$ Change in appetite '3 1 2 2 3  '$ Feeling bad or failure about yourself  '2 3 1 3 3  '$ Trouble concentrating '2 3 1 3 2  '$ Moving slowly or fidgety/restless 3 2 0 3 3  Suicidal thoughts 2 0 0 2 2  PHQ-9 Score '21 18 10 25 24  '$ Difficult doing work/chores    Extremely dIfficult       06/19/2022    8:44 AM 04/14/2022    9:30 AM 01/10/2022    3:51 PM 10/09/2021    4:18 PM  GAD 7 : Generalized Anxiety Score  Nervous, Anxious, on Edge '2 2 1 3  '$ Control/stop worrying '3 3 2 3  '$ Worry too much - different things '3 3 3 3  '$ Trouble relaxing '3 3 1 3  '$ Restless '3 3 3 3  '$ Easily annoyed or irritable '3 2 1 3  '$ Afraid - awful might happen '3 2 3 3  '$ Total GAD 7 Score '20 18 14 21  '$ Anxiety Difficulty    Extremely difficult

## 2022-06-20 ENCOUNTER — Other Ambulatory Visit: Payer: Self-pay

## 2022-06-20 LAB — CERVICOVAGINAL ANCILLARY ONLY
Bacterial Vaginitis (gardnerella): NEGATIVE
Candida Glabrata: NEGATIVE
Candida Vaginitis: POSITIVE — AB
Comment: NEGATIVE
Comment: NEGATIVE
Comment: NEGATIVE

## 2022-06-23 ENCOUNTER — Other Ambulatory Visit: Payer: Self-pay | Admitting: Obstetrics and Gynecology

## 2022-06-23 DIAGNOSIS — B3731 Acute candidiasis of vulva and vagina: Secondary | ICD-10-CM

## 2022-06-23 MED ORDER — CLOTRIMAZOLE 1 % VA CREA
1.0000 | TOPICAL_CREAM | Freq: Every day | VAGINAL | 2 refills | Status: AC
  Filled 2022-06-23: qty 45, 7d supply, fill #0

## 2022-06-24 ENCOUNTER — Other Ambulatory Visit: Payer: Self-pay

## 2022-06-25 ENCOUNTER — Other Ambulatory Visit: Payer: Self-pay

## 2022-06-25 ENCOUNTER — Other Ambulatory Visit: Payer: Self-pay | Admitting: Obstetrics and Gynecology

## 2022-06-25 DIAGNOSIS — B961 Klebsiella pneumoniae [K. pneumoniae] as the cause of diseases classified elsewhere: Secondary | ICD-10-CM

## 2022-06-25 LAB — ANAEROBIC AND AEROBIC CULTURE

## 2022-06-25 MED ORDER — SULFAMETHOXAZOLE-TRIMETHOPRIM 800-160 MG PO TABS
1.0000 | ORAL_TABLET | Freq: Two times a day (BID) | ORAL | 1 refills | Status: DC
  Filled 2022-06-25: qty 14, 7d supply, fill #0

## 2022-07-01 ENCOUNTER — Ambulatory Visit: Payer: Commercial Managed Care - HMO | Admitting: Clinical

## 2022-07-01 DIAGNOSIS — Z91199 Patient's noncompliance with other medical treatment and regimen due to unspecified reason: Secondary | ICD-10-CM

## 2022-07-01 NOTE — BH Specialist Note (Signed)
Pt did not arrive to video visit and did not answer the phone; Left HIPPA-compliant message to call back Lalanya Rufener from [facility] at  [phone] (Erlinda Solinger's office).  ?; left MyChart message for patient.  ? ?

## 2022-07-02 ENCOUNTER — Other Ambulatory Visit: Payer: Self-pay

## 2022-07-03 ENCOUNTER — Other Ambulatory Visit: Payer: Self-pay

## 2022-07-15 ENCOUNTER — Other Ambulatory Visit: Payer: Self-pay

## 2022-07-15 ENCOUNTER — Other Ambulatory Visit: Payer: Self-pay | Admitting: Nurse Practitioner

## 2022-07-15 ENCOUNTER — Encounter: Payer: Self-pay | Admitting: Nurse Practitioner

## 2022-07-15 ENCOUNTER — Other Ambulatory Visit: Payer: Self-pay | Admitting: Pharmacist

## 2022-07-15 ENCOUNTER — Ambulatory Visit: Payer: Commercial Managed Care - HMO | Attending: Nurse Practitioner | Admitting: Nurse Practitioner

## 2022-07-15 VITALS — BP 144/59 | HR 80 | Ht 61.0 in | Wt 212.0 lb

## 2022-07-15 DIAGNOSIS — G894 Chronic pain syndrome: Secondary | ICD-10-CM

## 2022-07-15 DIAGNOSIS — E1165 Type 2 diabetes mellitus with hyperglycemia: Secondary | ICD-10-CM | POA: Diagnosis not present

## 2022-07-15 DIAGNOSIS — M1A09X0 Idiopathic chronic gout, multiple sites, without tophus (tophi): Secondary | ICD-10-CM

## 2022-07-15 DIAGNOSIS — F331 Major depressive disorder, recurrent, moderate: Secondary | ICD-10-CM | POA: Diagnosis not present

## 2022-07-15 DIAGNOSIS — I10 Essential (primary) hypertension: Secondary | ICD-10-CM

## 2022-07-15 DIAGNOSIS — Z794 Long term (current) use of insulin: Secondary | ICD-10-CM

## 2022-07-15 LAB — POCT GLYCOSYLATED HEMOGLOBIN (HGB A1C): HbA1c, POC (controlled diabetic range): 6.5 % (ref 0.0–7.0)

## 2022-07-15 MED ORDER — LISINOPRIL 20 MG PO TABS
20.0000 mg | ORAL_TABLET | Freq: Every day | ORAL | 1 refills | Status: DC
  Filled 2022-07-15: qty 30, 30d supply, fill #0

## 2022-07-15 MED ORDER — ALLOPURINOL 100 MG PO TABS
200.0000 mg | ORAL_TABLET | Freq: Every day | ORAL | 1 refills | Status: DC
  Filled 2022-07-15: qty 180, 90d supply, fill #0
  Filled 2022-07-15: qty 60, 30d supply, fill #0
  Filled 2022-07-17 – 2022-08-04 (×2): qty 180, 90d supply, fill #0
  Filled 2022-10-27: qty 180, 90d supply, fill #1

## 2022-07-15 MED ORDER — SERTRALINE HCL 25 MG PO TABS
25.0000 mg | ORAL_TABLET | Freq: Every day | ORAL | 3 refills | Status: DC
  Filled 2022-07-15: qty 30, 30d supply, fill #0

## 2022-07-15 MED ORDER — ALLOPURINOL 200 MG PO TABS
200.0000 mg | ORAL_TABLET | Freq: Every day | ORAL | 1 refills | Status: DC
  Filled 2022-07-15: qty 90, 90d supply, fill #0

## 2022-07-15 MED ORDER — BASAGLAR KWIKPEN 100 UNIT/ML ~~LOC~~ SOPN
15.0000 [IU] | PEN_INJECTOR | Freq: Every day | SUBCUTANEOUS | 1 refills | Status: DC
  Filled 2022-07-15: qty 3, 20d supply, fill #0
  Filled 2022-08-04: qty 3, 20d supply, fill #1
  Filled 2022-08-28: qty 3, 20d supply, fill #2
  Filled 2022-09-16: qty 3, 20d supply, fill #3
  Filled 2022-10-06: qty 3, 20d supply, fill #4
  Filled 2022-10-21: qty 3, 20d supply, fill #5
  Filled 2022-11-10: qty 3, 20d supply, fill #6
  Filled 2022-12-01: qty 3, 20d supply, fill #7
  Filled 2022-12-15: qty 3, 20d supply, fill #8
  Filled 2023-01-05: qty 3, 20d supply, fill #9

## 2022-07-15 NOTE — Progress Notes (Signed)
Assessment & Plan:  Haeven was seen today for diabetes.  Diagnoses and all orders for this visit:  Controlled type 2 diabetes mellitus with hyperglycemia, with long-term current use of insulin (HCC) -     POCT glycosylated hemoglobin (Hb A1C) -     CMP14+EGFR -     Insulin Glargine (BASAGLAR KWIKPEN) 100 UNIT/ML; Inject 15 Units into the skin at bedtime. Continue blood sugar control as discussed in office today, low carbohydrate diet, and regular physical exercise as tolerated, 150 minutes per week (30 min each day, 5 days per week, or 50 min 3 days per week). Keep blood sugar logs with fasting goal of 90-130 mg/dl, post prandial (after you eat) less than 180.  For Hypoglycemia: BS <60 and Hyperglycemia BS >400; contact the clinic ASAP. Annual eye exams and foot exams are recommended.   Chronic gout of multiple sites, unspecified cause Allopurinol 200 MG TABS; Take 200 mg by mouth daily. Please fill as a 90 day supply -     Uric Acid  Essential hypertension DOSE CHANGE -     lisinopril (ZESTRIL) 20 MG tablet; Take 1 tablet (20 mg total) by mouth daily. Continue all antihypertensives as prescribed.  Reminded to bring in blood pressure log for follow  up appointment.  RECOMMENDATIONS: DASH/Mediterranean Diets are healthier choices for HTN.    Moderate episode of recurrent major depressive disorder (HCC) -     sertraline (ZOLOFT) 25 MG tablet; Take 1 tablet (25 mg total) by mouth daily.    Patient has been counseled on age-appropriate routine health concerns for screening and prevention. These are reviewed and up-to-date. Referrals have been placed accordingly. Immunizations are up-to-date or declined.    Subjective:   Chief Complaint  Patient presents with   Diabetes   HPI Kristy Flores 66 y.o. female presents to office today for follow up to diabetes, hypertension and depression.  She has a past medical history of Depression, DM2, Hyperlipidemia, Hypertension, Insomnia, and  Restless leg syndrome.    HTN Blood pressure is elevated today. She is taking lisinopril 10 mg daily as prescribed.  BP Readings from Last 3 Encounters:  07/15/22 (!) 144/59  06/19/22 (!) 185/75  04/25/22 (!) 178/84    DM Well controlled.  At this time we will continue glimepiride 4 mg daily and Basaglar 15 units at bedtime Lab Results  Component Value Date   HGBA1C 6.5 07/15/2022    Lab Results  Component Value Date   HGBA1C 7.2 (A) 04/14/2022  LDL not quite at goal with lovastatin 40 mg at bedtime. Lab Results  Component Value Date   LDLCALC 118 (H) 04/14/2022      Depression She has tried celexa, lexapro and paxil. All which have been  ineffective. She is open today to trying zoloft. PHQ9 increased. She is concerned about a close friend who was recently involved in a car accident and still grieving the death of one of her twin sons    07/15/2022    9:59 AM 06/19/2022    8:43 AM 04/14/2022    9:30 AM  Depression screen PHQ 2/9  Decreased Interest _0 Down, Depressed, Hopeless _1 PHQ - 2 Score _2 Altered sleeping _3 Tired, decreased energy _4 Change in appetite _5 Feeling bad or failure about yourself  _6 Trouble concentrating _7 Moving slowly or fidgety/restless 2 3 2  Suicidal thoughts 1 2 0  PHQ-9 Score _0 Review of Systems  Constitutional:  Negative for fever, malaise/fatigue and weight loss.  HENT: Negative.  Negative for nosebleeds.   Eyes: Negative.  Negative for blurred vision, double vision and photophobia.  Respiratory: Negative.  Negative for cough and shortness of breath.   Cardiovascular: Negative.  Negative for chest pain, palpitations and leg swelling.  Gastrointestinal: Negative.  Negative for heartburn, nausea and vomiting.  Musculoskeletal: Negative.  Negative for myalgias.  Neurological: Negative.  Negative for dizziness, focal weakness, seizures and headaches.  Psychiatric/Behavioral:  Positive for  depression. Negative for suicidal ideas.     Past Medical History:  Diagnosis Date   Anemia    Anxiety    Depression    Diabetes mellitus without complication ([place])    Headache    Hyperlipidemia    Hypertension    Insomnia    Melanoma ([place])    Back   Pneumonia    Restless leg syndrome     Past Surgical History:  Procedure Laterality Date   ABDOMINAL HYSTERECTOMY     CESAREAN SECTION     CHOLECYSTECTOMY N/A 04/25/2022   Procedure: LAPAROSCOPIC CHOLECYSTECTOMY WITH ICG DYE;  Surgeon: Greer Pickerel, MD;  Location: [facility];  Service: General;  Laterality: N/A;   ERCP N/A 02/13/2022   Procedure: ENDOSCOPIC RETROGRADE CHOLANGIOPANCREATOGRAPHY (ERCP);  Surgeon: Milus Banister, MD;  Location: [facility];  Service: Gastroenterology;  Laterality: N/A;   MELANOMA EXCISION  1985   REMOVAL OF STONES  02/13/2022   Procedure: REMOVAL OF STONES;  Surgeon: Milus Banister, MD;  Location: [facility];  Service: Gastroenterology;;   Kristy Flores  02/13/2022   Procedure: Kristy Flores;  Surgeon: Milus Banister, MD;  Location: [facility];  Service: Gastroenterology;;   UPPER GI ENDOSCOPY      Family History  Problem Relation Age of Onset   Kidney disease Mother    Diabetes Mother    Hypertension Mother    Cancer Father     Social History Reviewed with no changes to be made today.   Outpatient Medications Prior to Visit  Medication Sig Dispense Refill   acetaminophen (TYLENOL) 500 MG tablet Take 1,000 mg by mouth every 6 (six) hours as needed for moderate pain.     furosemide (LASIX) 20 MG tablet Take 1 tablet (20 mg total) by mouth every other day. 15 tablet 1   glimepiride (AMARYL) 4 MG tablet Take 1 tablet (4 mg total) by mouth daily before breakfast. 90 tablet 1   Insulin Pen Needle (B-D ULTRAFINE III SHORT PEN) 31G X 8 MM MISC USE AS INSTRUCTED. INJECT INTO THE SKIN ONCE NIGHTLY. 100 each 1   lovastatin (MEVACOR) 40 MG tablet Take 1 tablet (40 mg total) by mouth at  bedtime. 90 tablet 2   sodium bicarbonate 650 MG tablet Take 1 tablet (650 mg total) by mouth 2 (two) times daily. 60 tablet 11   Vitamin D, Ergocalciferol, (DRISDOL) 1.25 MG (50000 UNIT) CAPS capsule Take 1 capsule (50,000 Units total) by mouth every 7 (seven) days. 12 capsule 1   allopurinol (ZYLOPRIM) 100 MG tablet Take 2 tablets (200 mg total) by mouth daily. 60 tablet 2   DULoxetine (CYMBALTA) 30 MG capsule Take 1 capsule (30 mg total) by mouth 2 (two) times daily. 60 capsule 2   Insulin Glargine (BASAGLAR KWIKPEN) 100 UNIT/ML Inject 15 Units into the skin at bedtime. 3 mL 6   lisinopril (ZESTRIL) 10 MG tablet  Take 1 tablet (10 mg total) by mouth daily. 90 tablet 1   Blood Glucose Monitoring Suppl (TRUE METRIX METER) w/Device KIT Use as instructed. Check blood glucose level by fingerstick twice per day. (Patient not taking: Reported on 07/15/2022) 1 kit 0   clotrimazole-betamethasone (LOTRISONE) cream Apply 1 Application topically 2 (two) times daily. (Patient not taking: Reported on 07/15/2022) 30 g 0   sulfamethoxazole-trimethoprim (BACTRIM DS) 800-160 MG tablet Take 1 tablet by mouth 2 (two) times daily. (Patient not taking: Reported on 07/15/2022) 14 tablet 1   TRUEplus Lancets 28G MISC Use as instructed. Check blood glucose level by fingerstick twice per day. (Patient not taking: Reported on 07/15/2022) 100 each 3   metroNIDAZOLE (FLAGYL) 500 MG tablet Take 1 tablet (500 mg total) by mouth 3 (three) times daily. (Patient not taking: Reported on 06/19/2022) 21 tablet 0   oxyCODONE (OXY IR/ROXICODONE) 5 MG immediate release tablet Take 1 tablet (5 mg total) by mouth every 6 (six) hours as needed for severe pain. (Patient not taking: Reported on 06/19/2022) 15 tablet 0   oxyCODONE (OXY IR/ROXICODONE) 5 MG immediate release tablet Take 1 tablet (5 mg total) by mouth every 4 (four) hours as needed for Pain (Patient not taking: Reported on 06/19/2022) 5 tablet 0   No facility-administered  medications prior to visit.    Allergies  Allergen Reactions   Chlorzoxazone Nausea And Vomiting   Floxuridine     Hypoglycemia / pass out   Codeine Nausea And Vomiting   Gabapentin Swelling    AKI   Nsaids Other (See Comments)    CKD   Penicillins     Broke out, stomach upset.    Victoza [Liraglutide]     Shut down kidneys        Objective:    BP (!) 144/59   Pulse 80   Ht _0  (1.549 m)   Wt 212 lb (96.2 kg)   SpO2 96%   BMI 40.06 kg/m  Wt Readings from Last 3 Encounters:  07/15/22 212 lb (96.2 kg)  06/19/22 218 lb 14.4 oz (99.3 kg)  04/25/22 225 lb (102.1 kg)    Physical Exam Vitals and nursing note reviewed.  Constitutional:      Appearance: She is well-developed.  HENT:     Head: Normocephalic and atraumatic.  Cardiovascular:     Rate and Rhythm: Normal rate and regular rhythm.     Heart sounds: Normal heart sounds. No murmur heard.    No friction rub. No gallop.  Pulmonary:     Effort: Pulmonary effort is normal. No tachypnea or respiratory distress.     Breath sounds: Normal breath sounds. No decreased breath sounds, wheezing, rhonchi or rales.  Chest:     Chest wall: No tenderness.  Abdominal:     General: Bowel sounds are normal.     Palpations: Abdomen is soft.  Musculoskeletal:        General: Normal range of motion.     Cervical back: Normal range of motion.  Skin:    General: Skin is warm and dry.  Neurological:     Mental Status: She is alert and oriented to person, place, and time.     Coordination: Coordination normal.  Psychiatric:        Mood and Affect: Mood is depressed. Affect is flat.        Behavior: Behavior normal. Behavior is cooperative.        Thought Content: Thought content normal.  Judgment: Judgment normal.          Patient has been counseled extensively about nutrition and exercise as well as the importance of adherence with medications and regular follow-up. The patient was given clear instructions to go  to ER or return to medical center if symptoms don't improve, worsen or new problems develop. The patient verbalized understanding.   Follow-up: Return for 4 weeks BP check and PHQ9 doublebook at 1110.   Gildardo Pounds, FNP-BC [facility], [place]   07/15/2022, 11:56 AM

## 2022-07-16 ENCOUNTER — Other Ambulatory Visit: Payer: Self-pay

## 2022-07-16 LAB — URIC ACID: Uric Acid: 4.5 mg/dL (ref 3.0–7.2)

## 2022-07-16 LAB — CMP14+EGFR
ALT: 15 IU/L (ref 0–32)
AST: 16 IU/L (ref 0–40)
Albumin/Globulin Ratio: 1.4 (ref 1.2–2.2)
Albumin: 4 g/dL (ref 3.9–4.9)
Alkaline Phosphatase: 67 IU/L (ref 44–121)
BUN/Creatinine Ratio: 33 — ABNORMAL HIGH (ref 12–28)
BUN: 55 mg/dL — ABNORMAL HIGH (ref 8–27)
Bilirubin Total: 0.2 mg/dL (ref 0.0–1.2)
CO2: 18 mmol/L — ABNORMAL LOW (ref 20–29)
Calcium: 10.4 mg/dL — ABNORMAL HIGH (ref 8.7–10.3)
Chloride: 106 mmol/L (ref 96–106)
Creatinine, Ser: 1.65 mg/dL — ABNORMAL HIGH (ref 0.57–1.00)
Globulin, Total: 2.8 g/dL (ref 1.5–4.5)
Glucose: 157 mg/dL — ABNORMAL HIGH (ref 70–99)
Potassium: 5.4 mmol/L — ABNORMAL HIGH (ref 3.5–5.2)
Sodium: 140 mmol/L (ref 134–144)
Total Protein: 6.8 g/dL (ref 6.0–8.5)
eGFR: 34 mL/min/{1.73_m2} — ABNORMAL LOW (ref >59)

## 2022-07-16 MED ORDER — DULOXETINE HCL 30 MG PO CPEP
30.0000 mg | ORAL_CAPSULE | Freq: Two times a day (BID) | ORAL | 2 refills | Status: DC
  Filled 2022-07-16: qty 60, 30d supply, fill #0
  Filled 2022-08-15: qty 60, 30d supply, fill #1
  Filled 2022-09-11 (×2): qty 60, 30d supply, fill #2

## 2022-07-16 NOTE — Telephone Encounter (Signed)
This medication was discontinued 07/15/2022 as patient reported not taking it. Will forward to PCP for review. She was just started on Zoloft, but review of her chart shows that she has used Cymbalta in the past for pain control.

## 2022-07-17 ENCOUNTER — Other Ambulatory Visit: Payer: Self-pay

## 2022-07-18 ENCOUNTER — Ambulatory Visit: Payer: Self-pay | Admitting: *Deleted

## 2022-07-18 NOTE — Telephone Encounter (Signed)
Pt given lab results per notes of [name], NP from 07/16/22 on 07/18/22. Pt verbalized understanding uric acid normal. Patient reports she does see kidney doctor regularly , missed last appt due to gallbladder surgery and unable to drive. F/u lab appt scheduled for 07/28/22.

## 2022-07-22 ENCOUNTER — Other Ambulatory Visit: Payer: Self-pay

## 2022-07-22 ENCOUNTER — Other Ambulatory Visit: Payer: Self-pay | Admitting: Family Medicine

## 2022-07-22 MED ORDER — FUROSEMIDE 20 MG PO TABS
20.0000 mg | ORAL_TABLET | ORAL | 0 refills | Status: DC
  Filled 2022-07-22: qty 15, 30d supply, fill #0

## 2022-07-28 ENCOUNTER — Ambulatory Visit: Payer: Medicare HMO | Attending: Family Medicine

## 2022-07-28 ENCOUNTER — Other Ambulatory Visit: Payer: Self-pay | Admitting: Nurse Practitioner

## 2022-07-28 DIAGNOSIS — N1832 Chronic kidney disease, stage 3b: Secondary | ICD-10-CM

## 2022-07-29 LAB — CMP14+EGFR
ALT: 13 IU/L (ref 0–32)
AST: 14 IU/L (ref 0–40)
Albumin/Globulin Ratio: 1.6 (ref 1.2–2.2)
Albumin: 3.9 g/dL (ref 3.9–4.9)
Alkaline Phosphatase: 62 IU/L (ref 44–121)
BUN/Creatinine Ratio: 25 (ref 12–28)
BUN: 34 mg/dL — ABNORMAL HIGH (ref 8–27)
Bilirubin Total: <0.2 mg/dL (ref 0.0–1.2)
CO2: 23 mmol/L (ref 20–29)
Calcium: 9.8 mg/dL (ref 8.7–10.3)
Chloride: 104 mmol/L (ref 96–106)
Creatinine, Ser: 1.35 mg/dL — ABNORMAL HIGH (ref 0.57–1.00)
Globulin, Total: 2.5 g/dL (ref 1.5–4.5)
Glucose: 66 mg/dL — ABNORMAL LOW (ref 70–99)
Potassium: 4.9 mmol/L (ref 3.5–5.2)
Sodium: 140 mmol/L (ref 134–144)
Total Protein: 6.4 g/dL (ref 6.0–8.5)
eGFR: 44 mL/min/{1.73_m2} — ABNORMAL LOW (ref >59)

## 2022-07-31 ENCOUNTER — Ambulatory Visit: Payer: Commercial Managed Care - HMO | Admitting: Obstetrics and Gynecology

## 2022-08-04 ENCOUNTER — Other Ambulatory Visit: Payer: Self-pay

## 2022-08-12 ENCOUNTER — Other Ambulatory Visit: Payer: Self-pay

## 2022-08-12 ENCOUNTER — Encounter: Payer: Self-pay | Admitting: Nurse Practitioner

## 2022-08-12 ENCOUNTER — Ambulatory Visit: Payer: Medicaid Other | Attending: Nurse Practitioner | Admitting: Nurse Practitioner

## 2022-08-12 VITALS — BP 148/77 | HR 72 | Ht 61.0 in | Wt 217.5 lb

## 2022-08-12 DIAGNOSIS — I10 Essential (primary) hypertension: Secondary | ICD-10-CM | POA: Diagnosis not present

## 2022-08-12 DIAGNOSIS — E669 Obesity, unspecified: Secondary | ICD-10-CM

## 2022-08-12 DIAGNOSIS — F331 Major depressive disorder, recurrent, moderate: Secondary | ICD-10-CM

## 2022-08-12 DIAGNOSIS — Z6841 Body Mass Index (BMI) 40.0 and over, adult: Secondary | ICD-10-CM | POA: Diagnosis not present

## 2022-08-12 DIAGNOSIS — B9689 Other specified bacterial agents as the cause of diseases classified elsewhere: Secondary | ICD-10-CM | POA: Diagnosis not present

## 2022-08-12 DIAGNOSIS — R69 Illness, unspecified: Secondary | ICD-10-CM | POA: Diagnosis not present

## 2022-08-12 DIAGNOSIS — J019 Acute sinusitis, unspecified: Secondary | ICD-10-CM

## 2022-08-12 MED ORDER — FLUTICASONE PROPIONATE 50 MCG/ACT NA SUSP
2.0000 | Freq: Every day | NASAL | 0 refills | Status: DC
  Filled 2022-08-12: qty 16, 30d supply, fill #0

## 2022-08-12 MED ORDER — LISINOPRIL 30 MG PO TABS
30.0000 mg | ORAL_TABLET | Freq: Every day | ORAL | 6 refills | Status: DC
  Filled 2022-08-12: qty 30, 30d supply, fill #0
  Filled 2022-09-11 (×2): qty 30, 30d supply, fill #1
  Filled 2022-10-07: qty 30, 30d supply, fill #2
  Filled 2022-11-03: qty 30, 30d supply, fill #3
  Filled 2022-12-03: qty 30, 30d supply, fill #4
  Filled 2022-12-30: qty 30, 30d supply, fill #5

## 2022-08-12 MED ORDER — DOXYCYCLINE HYCLATE 100 MG PO TABS
100.0000 mg | ORAL_TABLET | Freq: Two times a day (BID) | ORAL | 0 refills | Status: DC
  Filled 2022-08-12: qty 14, 7d supply, fill #0

## 2022-08-12 NOTE — Progress Notes (Signed)
Running nose for 3 weeks

## 2022-08-12 NOTE — Progress Notes (Signed)
Kristy Flores was seen today for hypertension and depression screening .  Diagnoses and all orders for this visit:  Primary hypertension -     lisinopril (ZESTRIL) 30 MG tablet; Take 1 tablet (30 mg total) by mouth daily. DOSE CHANGE Continue all antihypertensives as prescribed.  Reminded to bring in blood pressure log for follow  up appointment.  RECOMMENDATIONS: DASH/Mediterranean Diets are healthier choices for HTN.    Acute bacterial sinusitis -     fluticasone (FLONASE) 50 MCG/ACT nasal spray; Place 2 sprays into both nostrils daily. -     doxycycline (VIBRA-TABS) 100 MG tablet; Take 1 tablet (100 mg total) by mouth 2 (two) times daily.  Moderate episode of recurrent major depressive disorder (HCC) Declines SSRI/referral   Subjective:   Chief Complaint  Patient presents with   Hypertension   depression screening    HPI Flores Kristy 67 y.o. female presents to office today for follow up to Depression and HTN.  She has a past medical history of Depression, DM2, Hyperlipidemia, Hypertension, Insomnia, and Restless leg syndrome   Her lisinopril was increased from '10mg'$  to 20 mg last month due to elevated readings. We also started her on zoloft at that time for depression. Today she states zoloft made her mood worsen so she stopped taking it.  Blood pressure continues elevated today. We will increase losartan. She does not desire to start on any other SSRIs or mood stabilizers today. Also declined referral for counseling.    HTN Continues elevated. Will increase lisinopril from '20mg'$  to '30mg'$  today BP Readings from Last 3 Encounters:  08/12/22 (!) 148/77  07/15/22 (!) 144/59  06/19/22 (!) 185/75     Depression She has tried celexa, lexapro and paxil. All which have been  ineffective. She is open today to trying zoloft. PHQ9 increased. She is concerned about a close friend who was recently involved in a car accident and still grieving the death of one of her twin sons    08/12/2022    11:55 AM 07/15/2022    9:59 AM 06/19/2022    8:43 AM  Depression screen PHQ 2/9  Decreased Interest '1 2 1  '$ Down, Depressed, Hopeless '2 3 2  '$ PHQ - 2 Score '3 5 3  '$ Altered sleeping '3 3 3  '$ Tired, decreased energy '2 3 3  '$ Change in appetite '3 3 3  '$ Feeling bad or failure about yourself  '1 3 2  '$ Trouble concentrating '1 2 2  '$ Moving slowly or fidgety/restless '2 2 3  '$ Suicidal thoughts 0 1 2  PHQ-9 Score '15 22 21     '$ Upper respiratory symptoms She complains of post nasal drip and persistent rhinorrhea .with no fever, chills, night sweats or weight loss. Onset of symptoms was a few weeks ago and staying constant.She has no history of pneumonia or bronchitis. Patient is non-smoker She denies sinus pain or pressure, cough, or fever  ---------------------------------------------------------------------------------------------------  Review of Systems  Constitutional:  Negative for fever, malaise/fatigue and weight loss.  HENT: Negative.  Negative for congestion, ear discharge, ear pain, hearing loss, nosebleeds, sinus pain and tinnitus.        SEE HPI  Eyes: Negative.  Negative for blurred vision, double vision and photophobia.  Respiratory: Negative.  Negative for cough and shortness of breath.   Cardiovascular: Negative.  Negative for chest pain, palpitations and leg swelling.  Gastrointestinal: Negative.  Negative for heartburn, nausea and vomiting.  Musculoskeletal: Negative.  Negative for myalgias.  Neurological: Negative.  Negative for dizziness, focal  weakness, seizures and headaches.  Psychiatric/Behavioral:  Positive for depression. Negative for suicidal ideas.     Past Medical History:  Diagnosis Date   Anemia    Anxiety    Depression    Diabetes mellitus without complication ([place])    Headache    Hyperlipidemia    Hypertension    Insomnia    Melanoma ([place])    Back   Pneumonia    Restless leg syndrome     Past Surgical History:  Procedure Laterality Date   ABDOMINAL  HYSTERECTOMY     CESAREAN SECTION     CHOLECYSTECTOMY N/A 04/25/2022   Procedure: LAPAROSCOPIC CHOLECYSTECTOMY WITH ICG DYE;  Surgeon: Greer Pickerel, MD;  Location: [facility];  Service: General;  Laterality: N/A;   ERCP N/A 02/13/2022   Procedure: ENDOSCOPIC RETROGRADE CHOLANGIOPANCREATOGRAPHY (ERCP);  Surgeon: Milus Banister, MD;  Location: [facility];  Service: Gastroenterology;  Laterality: N/A;   MELANOMA EXCISION  1985   REMOVAL OF STONES  02/13/2022   Procedure: REMOVAL OF STONES;  Surgeon: Milus Banister, MD;  Location: [facility];  Service: Gastroenterology;;   Joan Mayans  02/13/2022   Procedure: Joan Mayans;  Surgeon: Milus Banister, MD;  Location: [facility];  Service: Gastroenterology;;   UPPER GI ENDOSCOPY      Family History  Problem Relation Age of Onset   Kidney disease Mother    Diabetes Mother    Hypertension Mother    Cancer Father     Social History Reviewed with no changes to be made today.   Outpatient Medications Prior to Visit  Medication Sig Dispense Refill   acetaminophen (TYLENOL) 500 MG tablet Take 1,000 mg by mouth every 6 (six) hours as needed for moderate pain.     allopurinol (ZYLOPRIM) 100 MG tablet Take 2 tablets (200 mg total) by mouth daily. 180 tablet 1   DULoxetine (CYMBALTA) 30 MG capsule Take 1 capsule (30 mg total) by mouth 2 (two) times daily. 60 capsule 2   furosemide (LASIX) 20 MG tablet Take 1 tablet (20 mg total) by mouth every other day. 15 tablet 0   glimepiride (AMARYL) 4 MG tablet Take 1 tablet (4 mg total) by mouth daily before breakfast. 90 tablet 1   Insulin Glargine (BASAGLAR KWIKPEN) 100 UNIT/ML Inject 15 Units into the skin at bedtime. 15 mL 1   Insulin Pen Needle (B-D ULTRAFINE III SHORT PEN) 31G X 8 MM MISC USE AS INSTRUCTED. INJECT INTO THE SKIN ONCE NIGHTLY. 100 each 1   lovastatin (MEVACOR) 40 MG tablet Take 1 tablet (40 mg total) by mouth at bedtime. 90 tablet 2   sodium bicarbonate 650 MG tablet Take 1  tablet (650 mg total) by mouth 2 (two) times daily. 60 tablet 11   lisinopril (ZESTRIL) 20 MG tablet Take 1 tablet (20 mg total) by mouth daily. 90 tablet 1   Blood Glucose Monitoring Suppl (TRUE METRIX METER) w/Device KIT Use as instructed. Check blood glucose level by fingerstick twice per day. (Patient not taking: Reported on 07/15/2022) 1 kit 0   TRUEplus Lancets 28G MISC Use as instructed. Check blood glucose level by fingerstick twice per day. (Patient not taking: Reported on 07/15/2022) 100 each 3   clotrimazole-betamethasone (LOTRISONE) cream Apply 1 Application topically 2 (two) times daily. (Patient not taking: Reported on 07/15/2022) 30 g 0   sertraline (ZOLOFT) 25 MG tablet Take 1 tablet (25 mg total) by mouth daily. (Patient not taking: Reported on 08/12/2022) 30 tablet 3   sulfamethoxazole-trimethoprim (BACTRIM DS)  800-160 MG tablet Take 1 tablet by mouth 2 (two) times daily. (Patient not taking: Reported on 07/15/2022) 14 tablet 1   Vitamin D, Ergocalciferol, (DRISDOL) 1.25 MG (50000 UNIT) CAPS capsule Take 1 capsule (50,000 Units total) by mouth every 7 (seven) days. (Patient not taking: Reported on 08/12/2022) 12 capsule 1   No facility-administered medications prior to visit.    Allergies  Allergen Reactions   Chlorzoxazone Nausea And Vomiting   Floxuridine     Hypoglycemia / pass out   Codeine Nausea And Vomiting   Gabapentin Swelling    AKI   Nsaids Other (See Comments)    CKD   Penicillins     Broke out, stomach upset.    Victoza [Liraglutide]     Shut down kidneys        Objective:    BP (!) 148/77   Pulse 72   Ht '5\' 1"'$  (1.549 m)   Wt 217 lb 8 oz (98.7 kg)   SpO2 99%   BMI 41.10 kg/m  Wt Readings from Last 3 Encounters:  08/12/22 217 lb 8 oz (98.7 kg)  07/15/22 212 lb (96.2 kg)  06/19/22 218 lb 14.4 oz (99.3 kg)    Physical Exam Vitals and nursing note reviewed.  Constitutional:      Appearance: She is well-developed.  HENT:     Head: Normocephalic  and atraumatic.     Nose: Mucosal edema present.     Right Turbinates: Not enlarged, swollen or pale.     Left Turbinates: Not enlarged, swollen or pale.  Cardiovascular:     Rate and Rhythm: Normal rate and regular rhythm.     Heart sounds: Normal heart sounds. No murmur heard.    No friction rub. No gallop.  Pulmonary:     Effort: Pulmonary effort is normal. No tachypnea or respiratory distress.     Breath sounds: Normal breath sounds. No decreased breath sounds, wheezing, rhonchi or rales.  Chest:     Chest wall: No tenderness.  Abdominal:     General: Bowel sounds are normal.     Palpations: Abdomen is soft.  Musculoskeletal:        General: Normal range of motion.     Cervical back: Normal range of motion.  Skin:    General: Skin is warm and dry.  Neurological:     Mental Status: She is alert and oriented to person, place, and time.     Coordination: Coordination normal.  Psychiatric:        Behavior: Behavior normal. Behavior is cooperative.        Thought Content: Thought content normal.        Judgment: Judgment normal.          Patient has been counseled extensively about nutrition and exercise as well as the importance of adherence with medications and regular follow-up. The patient was given clear instructions to go to ER or return to medical center if symptoms don't improve, worsen or new problems develop. The patient verbalized understanding.   Follow-up: Return in about 5 months (around 01/11/2023).   Gildardo Pounds, FNP-BC [facility] and [place], [place]   08/12/2022, 1:49 PM

## 2022-08-15 ENCOUNTER — Other Ambulatory Visit: Payer: Self-pay

## 2022-08-19 ENCOUNTER — Other Ambulatory Visit: Payer: Self-pay

## 2022-08-26 ENCOUNTER — Other Ambulatory Visit: Payer: Self-pay

## 2022-08-26 ENCOUNTER — Other Ambulatory Visit: Payer: Self-pay | Admitting: Family Medicine

## 2022-08-27 ENCOUNTER — Other Ambulatory Visit: Payer: Self-pay

## 2022-08-27 MED ORDER — FUROSEMIDE 20 MG PO TABS
20.0000 mg | ORAL_TABLET | ORAL | 1 refills | Status: DC
  Filled 2022-08-27: qty 45, 90d supply, fill #0
  Filled 2022-11-18: qty 45, 90d supply, fill #1

## 2022-08-27 NOTE — Telephone Encounter (Signed)
Requested medication (s) are due for refill today: yes  Requested medication (s) are on the active medication list: yes  Last refill:  07/22/22 #15 0 refills  Future visit scheduled: yes in 4 months  Notes to clinic:  no refills remain. Do you want to refill Rx?     Requested Prescriptions  Pending Prescriptions Disp Refills   furosemide (LASIX) 20 MG tablet 15 tablet 0    Sig: Take 1 tablet (20 mg total) by mouth every other day.     Cardiovascular:  Diuretics - Loop Failed - 08/26/2022  8:23 AM      Failed - Cr in normal range and within 180 days    Creatinine, Ser  Date Value Ref Range Status  07/28/2022 1.35 (H) 0.57 - 1.00 mg/dL Final         Failed - Mg Level in normal range and within 180 days    No results found for: "MG"       Failed - Last BP in normal range    BP Readings from Last 1 Encounters:  08/12/22 (!) 148/77         Passed - K in normal range and within 180 days    Potassium  Date Value Ref Range Status  07/28/2022 4.9 3.5 - 5.2 mmol/L Final         Passed - Ca in normal range and within 180 days    Calcium  Date Value Ref Range Status  07/28/2022 9.8 8.7 - 10.3 mg/dL Final         Passed - Na in normal range and within 180 days    Sodium  Date Value Ref Range Status  07/28/2022 140 134 - 144 mmol/L Final         Passed - Cl in normal range and within 180 days    Chloride  Date Value Ref Range Status  07/28/2022 104 96 - 106 mmol/L Final         Passed - Valid encounter within last 6 months    Recent Outpatient Visits           2 weeks ago Primary hypertension   [place], [place] W, NP   1 month ago Controlled type 2 diabetes mellitus with hyperglycemia, with long-term current use of insulin [facility])   [place], Vernia Buff, NP   4 months ago Essential hypertension   [place], [place] W, NP   7 months ago  Controlled type 2 diabetes mellitus with hyperglycemia, with long-term current use of insulin [facility])   [place], [place] W, NP   10 months ago Controlled type 2 diabetes mellitus with hyperglycemia, with long-term current use of insulin [facility])   [place], Vernia Buff, NP       Future Appointments             In 4 months Gildardo Pounds, NP [place]

## 2022-08-28 ENCOUNTER — Other Ambulatory Visit: Payer: Self-pay

## 2022-09-05 ENCOUNTER — Other Ambulatory Visit: Payer: Self-pay

## 2022-09-11 ENCOUNTER — Other Ambulatory Visit: Payer: Self-pay

## 2022-09-16 ENCOUNTER — Other Ambulatory Visit: Payer: Self-pay

## 2022-09-30 ENCOUNTER — Other Ambulatory Visit: Payer: Self-pay | Admitting: Family Medicine

## 2022-09-30 ENCOUNTER — Other Ambulatory Visit: Payer: Self-pay

## 2022-09-30 MED ORDER — TECHLITE PEN NEEDLES 31G X 8 MM MISC
0 refills | Status: DC
  Filled 2022-09-30: qty 100, 100d supply, fill #0

## 2022-09-30 NOTE — Telephone Encounter (Signed)
Requested Prescriptions  Pending Prescriptions Disp Refills   Insulin Pen Needle (TECHLITE PEN NEEDLES) 31G X 8 MM MISC 100 each 0    Sig: USE AS INSTRUCTED. INJECT INTO THE SKIN ONCE NIGHTLY.     Endocrinology: Diabetes - Testing Supplies Passed - 09/30/2022  8:02 AM      Passed - Valid encounter within last 12 months    Recent Outpatient Visits           1 month ago Primary hypertension   [place], [place] W, NP   2 months ago Controlled type 2 diabetes mellitus with hyperglycemia, with long-term current use of insulin [facility])   [place], Vernia Buff, NP   5 months ago Essential hypertension   [place], [place] W, NP   8 months ago Controlled type 2 diabetes mellitus with hyperglycemia, with long-term current use of insulin [facility])   [place], [place] W, NP   11 months ago Controlled type 2 diabetes mellitus with hyperglycemia, with long-term current use of insulin [facility])   [place] Gildardo Pounds, NP       Future Appointments             In 3 months Gildardo Pounds, NP [place]

## 2022-10-01 ENCOUNTER — Other Ambulatory Visit: Payer: Self-pay

## 2022-10-02 ENCOUNTER — Other Ambulatory Visit: Payer: Self-pay

## 2022-10-06 ENCOUNTER — Other Ambulatory Visit: Payer: Self-pay | Admitting: Nurse Practitioner

## 2022-10-06 ENCOUNTER — Other Ambulatory Visit: Payer: Self-pay

## 2022-10-06 DIAGNOSIS — E1165 Type 2 diabetes mellitus with hyperglycemia: Secondary | ICD-10-CM

## 2022-10-06 DIAGNOSIS — E785 Hyperlipidemia, unspecified: Secondary | ICD-10-CM

## 2022-10-06 MED ORDER — LOVASTATIN 40 MG PO TABS
40.0000 mg | ORAL_TABLET | Freq: Every day | ORAL | 0 refills | Status: DC
  Filled 2022-10-06 – 2022-10-28 (×2): qty 90, 90d supply, fill #0

## 2022-10-07 ENCOUNTER — Other Ambulatory Visit: Payer: Self-pay

## 2022-10-07 ENCOUNTER — Other Ambulatory Visit: Payer: Self-pay | Admitting: Nurse Practitioner

## 2022-10-07 DIAGNOSIS — G894 Chronic pain syndrome: Secondary | ICD-10-CM

## 2022-10-07 DIAGNOSIS — B9689 Other specified bacterial agents as the cause of diseases classified elsewhere: Secondary | ICD-10-CM

## 2022-10-07 MED ORDER — DULOXETINE HCL 30 MG PO CPEP
30.0000 mg | ORAL_CAPSULE | Freq: Two times a day (BID) | ORAL | 2 refills | Status: DC
  Filled 2022-10-07: qty 60, 30d supply, fill #0
  Filled 2022-11-06: qty 60, 30d supply, fill #1
  Filled 2022-12-03: qty 60, 30d supply, fill #2

## 2022-10-07 MED ORDER — FLUTICASONE PROPIONATE 50 MCG/ACT NA SUSP: 2.0000 | Freq: Every day | NASAL | 0 refills | Status: DC

## 2022-10-07 NOTE — Telephone Encounter (Signed)
Requested Prescriptions  Pending Prescriptions Disp Refills   DULoxetine (CYMBALTA) 30 MG capsule 60 capsule 2    Sig: Take 1 capsule (30 mg total) by mouth 2 (two) times daily.     Psychiatry: Antidepressants - SNRI - duloxetine Failed - 10/07/2022  5:40 AM      Failed - Cr in normal range and within 360 days    Creatinine, Ser  Date Value Ref Range Status  07/28/2022 1.35 (H) 0.57 - 1.00 mg/dL Final         Failed - Last BP in normal range    BP Readings from Last 1 Encounters:  08/12/22 (!) 148/77         Passed - eGFR is 30 or above and within 360 days    GFR calc Af Amer  Date Value Ref Range Status  09/12/2020 39 (L) >59 mL/min/1.73 Final    Comment:    **In accordance with recommendations from the NKF-ASN Task force,**   [facility] is in the process of updating its eGFR calculation to the   2021 CKD-EPI creatinine equation that estimates kidney function   without a race variable.    GFR calc non Af Amer  Date Value Ref Range Status  09/12/2020 34 (L) >59 mL/min/1.73 Final   GFR  Date Value Ref Range Status  12/10/2021 37.23 (L) >60.00 mL/min Final    Comment:    Calculated using the CKD-EPI Creatinine Equation (2021)   eGFR  Date Value Ref Range Status  07/28/2022 44 (L) >59 mL/min/1.73 Final         Passed - Completed PHQ-2 or PHQ-9 in the last 360 days      Passed - Valid encounter within last 6 months    Recent Outpatient Visits           1 month ago Primary hypertension   [place], [place] W, NP   2 months ago Controlled type 2 diabetes mellitus with hyperglycemia, with long-term current use of insulin [facility])   [place], Vernia Buff, NP   5 months ago Essential hypertension   [place], [place] W, NP   9 months ago Controlled type 2 diabetes mellitus with hyperglycemia, with long-term current use of insulin [facility])   [place], [place] W, NP   12 months ago Controlled type 2 diabetes mellitus with hyperglycemia, with long-term current use of insulin [facility])   [place] Gildardo Pounds, NP       Future Appointments             In 3 months Gildardo Pounds, NP [place]             fluticasone [facility]) 50 MCG/ACT nasal spray 16 g 0    Sig: Place 2 sprays into both nostrils daily.     Ear, Nose, and Throat: Nasal Preparations - Corticosteroids Passed - 10/07/2022  5:40 AM      Passed - Valid encounter within last 12 months    Recent Outpatient Visits           1 month ago Primary hypertension   [place], [place] W, NP   2 months ago Controlled type 2 diabetes mellitus with hyperglycemia, with long-term current use of insulin ([place])   [facility]  [place], Vernia Buff, NP   5 months ago Essential hypertension   [place], [place] W, NP   9 months ago Controlled type 2 diabetes mellitus with hyperglycemia, with long-term current use of insulin [facility])   [place], [place] W, NP   12 months ago Controlled type 2 diabetes mellitus with hyperglycemia, with long-term current use of insulin [facility])   [place] Gildardo Pounds, NP       Future Appointments             In 3 months Gildardo Pounds, NP [place]

## 2022-10-08 ENCOUNTER — Other Ambulatory Visit: Payer: Self-pay

## 2022-10-13 ENCOUNTER — Other Ambulatory Visit: Payer: Self-pay

## 2022-10-13 ENCOUNTER — Other Ambulatory Visit: Payer: Self-pay | Admitting: Family Medicine

## 2022-10-13 DIAGNOSIS — E1165 Type 2 diabetes mellitus with hyperglycemia: Secondary | ICD-10-CM

## 2022-10-13 MED ORDER — GLIMEPIRIDE 4 MG PO TABS
4.0000 mg | ORAL_TABLET | Freq: Every day | ORAL | 0 refills | Status: DC
  Filled 2022-10-13: qty 90, 90d supply, fill #0

## 2022-10-23 ENCOUNTER — Telehealth: Payer: Self-pay | Admitting: Nurse Practitioner

## 2022-10-23 ENCOUNTER — Other Ambulatory Visit: Payer: Self-pay

## 2022-10-23 NOTE — Telephone Encounter (Signed)
Contacted Shonell Dutchover to schedule their annual wellness visit. Welcome to Medicare visit Due by 07/22/23 .  Barkley Boards AWV direct phone # [phone]   WTM before 07/22/23 per [employer]

## 2022-10-28 ENCOUNTER — Other Ambulatory Visit: Payer: Self-pay

## 2022-11-17 DIAGNOSIS — E872 Acidosis, unspecified: Secondary | ICD-10-CM | POA: Diagnosis not present

## 2022-11-17 DIAGNOSIS — I129 Hypertensive chronic kidney disease with stage 1 through stage 4 chronic kidney disease, or unspecified chronic kidney disease: Secondary | ICD-10-CM | POA: Diagnosis not present

## 2022-11-17 DIAGNOSIS — N2581 Secondary hyperparathyroidism of renal origin: Secondary | ICD-10-CM | POA: Diagnosis not present

## 2022-11-17 DIAGNOSIS — N1832 Chronic kidney disease, stage 3b: Secondary | ICD-10-CM | POA: Diagnosis not present

## 2022-11-17 DIAGNOSIS — E1122 Type 2 diabetes mellitus with diabetic chronic kidney disease: Secondary | ICD-10-CM | POA: Diagnosis not present

## 2022-11-17 DIAGNOSIS — R809 Proteinuria, unspecified: Secondary | ICD-10-CM | POA: Diagnosis not present

## 2022-11-18 LAB — LAB REPORT - SCANNED
Creatinine, POC: 44.2 mg/dL
EGFR: 34

## 2022-11-19 ENCOUNTER — Other Ambulatory Visit: Payer: Self-pay

## 2022-12-01 ENCOUNTER — Other Ambulatory Visit: Payer: Self-pay

## 2022-12-24 DIAGNOSIS — K439 Ventral hernia without obstruction or gangrene: Secondary | ICD-10-CM | POA: Diagnosis not present

## 2022-12-24 DIAGNOSIS — M6208 Separation of muscle (nontraumatic), other site: Secondary | ICD-10-CM | POA: Diagnosis not present

## 2022-12-24 DIAGNOSIS — E669 Obesity, unspecified: Secondary | ICD-10-CM | POA: Diagnosis not present

## 2022-12-24 DIAGNOSIS — E1169 Type 2 diabetes mellitus with other specified complication: Secondary | ICD-10-CM | POA: Diagnosis not present

## 2022-12-24 DIAGNOSIS — Z9049 Acquired absence of other specified parts of digestive tract: Secondary | ICD-10-CM | POA: Diagnosis not present

## 2022-12-24 DIAGNOSIS — N1832 Chronic kidney disease, stage 3b: Secondary | ICD-10-CM | POA: Diagnosis not present

## 2022-12-26 ENCOUNTER — Other Ambulatory Visit (HOSPITAL_COMMUNITY): Payer: Self-pay | Admitting: General Surgery

## 2022-12-26 DIAGNOSIS — K439 Ventral hernia without obstruction or gangrene: Secondary | ICD-10-CM

## 2022-12-30 ENCOUNTER — Other Ambulatory Visit: Payer: Self-pay

## 2022-12-30 ENCOUNTER — Other Ambulatory Visit: Payer: Self-pay | Admitting: Nurse Practitioner

## 2022-12-30 DIAGNOSIS — G894 Chronic pain syndrome: Secondary | ICD-10-CM

## 2022-12-30 MED ORDER — DULOXETINE HCL 30 MG PO CPEP
30.0000 mg | ORAL_CAPSULE | Freq: Two times a day (BID) | ORAL | 0 refills | Status: DC
  Filled 2022-12-30: qty 60, 30d supply, fill #0

## 2023-01-09 ENCOUNTER — Other Ambulatory Visit: Payer: Self-pay | Admitting: Nurse Practitioner

## 2023-01-09 ENCOUNTER — Other Ambulatory Visit: Payer: Self-pay

## 2023-01-09 ENCOUNTER — Other Ambulatory Visit: Payer: Self-pay | Admitting: Family Medicine

## 2023-01-09 DIAGNOSIS — E1165 Type 2 diabetes mellitus with hyperglycemia: Secondary | ICD-10-CM

## 2023-01-09 MED ORDER — GLIMEPIRIDE 4 MG PO TABS
4.0000 mg | ORAL_TABLET | Freq: Every day | ORAL | 0 refills | Status: DC
  Filled 2023-01-09: qty 30, 30d supply, fill #0

## 2023-01-09 MED ORDER — BD PEN NEEDLE SHORT U/F 31G X 8 MM MISC
0 refills | Status: DC
  Filled 2023-01-09: qty 100, 100d supply, fill #0

## 2023-01-12 ENCOUNTER — Other Ambulatory Visit: Payer: Self-pay

## 2023-01-12 ENCOUNTER — Ambulatory Visit: Payer: Medicare HMO | Attending: Nurse Practitioner | Admitting: Nurse Practitioner

## 2023-01-12 ENCOUNTER — Encounter: Payer: Self-pay | Admitting: Nurse Practitioner

## 2023-01-12 VITALS — BP 106/69 | HR 76 | Ht 61.0 in | Wt 216.8 lb

## 2023-01-12 DIAGNOSIS — I10 Essential (primary) hypertension: Secondary | ICD-10-CM

## 2023-01-12 DIAGNOSIS — N1832 Chronic kidney disease, stage 3b: Secondary | ICD-10-CM

## 2023-01-12 DIAGNOSIS — E785 Hyperlipidemia, unspecified: Secondary | ICD-10-CM | POA: Diagnosis not present

## 2023-01-12 DIAGNOSIS — N183 Chronic kidney disease, stage 3 unspecified: Secondary | ICD-10-CM

## 2023-01-12 DIAGNOSIS — E7800 Pure hypercholesterolemia, unspecified: Secondary | ICD-10-CM | POA: Diagnosis not present

## 2023-01-12 DIAGNOSIS — Z7984 Long term (current) use of oral hypoglycemic drugs: Secondary | ICD-10-CM | POA: Diagnosis not present

## 2023-01-12 DIAGNOSIS — Z794 Long term (current) use of insulin: Secondary | ICD-10-CM

## 2023-01-12 DIAGNOSIS — E1165 Type 2 diabetes mellitus with hyperglycemia: Secondary | ICD-10-CM | POA: Diagnosis not present

## 2023-01-12 DIAGNOSIS — G894 Chronic pain syndrome: Secondary | ICD-10-CM

## 2023-01-12 DIAGNOSIS — T827XXA Infection and inflammatory reaction due to other cardiac and vascular devices, implants and grafts, initial encounter: Secondary | ICD-10-CM

## 2023-01-12 DIAGNOSIS — B9689 Other specified bacterial agents as the cause of diseases classified elsewhere: Secondary | ICD-10-CM

## 2023-01-12 DIAGNOSIS — E1122 Type 2 diabetes mellitus with diabetic chronic kidney disease: Secondary | ICD-10-CM

## 2023-01-12 DIAGNOSIS — L089 Local infection of the skin and subcutaneous tissue, unspecified: Secondary | ICD-10-CM

## 2023-01-12 LAB — POCT GLYCOSYLATED HEMOGLOBIN (HGB A1C): HbA1c, POC (controlled diabetic range): 6.2 % (ref 0.0–7.0)

## 2023-01-12 MED ORDER — DULOXETINE HCL 30 MG PO CPEP
30.0000 mg | ORAL_CAPSULE | Freq: Two times a day (BID) | ORAL | 1 refills | Status: DC
Start: 2023-01-12 — End: 2023-04-14
  Filled 2023-01-12 – 2023-01-28 (×2): qty 180, 90d supply, fill #0

## 2023-01-12 MED ORDER — ONETOUCH VERIO FLEX SYSTEM W/DEVICE KIT
1.0000 | PACK | Freq: Every day | 0 refills | Status: AC
Start: 2023-01-12 — End: ?
  Filled 2023-01-12: qty 1, 30d supply, fill #0

## 2023-01-12 MED ORDER — ALLOPURINOL 100 MG PO TABS
200.0000 mg | ORAL_TABLET | Freq: Every day | ORAL | 1 refills | Status: DC
  Filled 2023-01-12: qty 180, 90d supply, fill #0

## 2023-01-12 MED ORDER — LANCET DEVICE MISC
1.0000 | Freq: Every day | 0 refills | Status: AC
Start: 2023-01-12 — End: ?
  Filled 2023-01-12 – 2023-03-14 (×4): qty 1, fill #0

## 2023-01-12 MED ORDER — BASAGLAR KWIKPEN 100 UNIT/ML ~~LOC~~ SOPN
15.0000 [IU] | PEN_INJECTOR | Freq: Every day | SUBCUTANEOUS | 3 refills | Status: DC
Start: 2023-01-12 — End: 2023-04-14
  Filled 2023-01-12 – 2023-01-23 (×2): qty 15, 100d supply, fill #0

## 2023-01-12 MED ORDER — LISINOPRIL 30 MG PO TABS
30.0000 mg | ORAL_TABLET | Freq: Every day | ORAL | 1 refills | Status: DC
Start: 2023-01-12 — End: 2023-04-14
  Filled 2023-01-12 – 2023-01-26 (×2): qty 90, 90d supply, fill #0

## 2023-01-12 MED ORDER — ONETOUCH DELICA PLUS LANCET33G MISC
1.0000 | Freq: Every day | 1 refills | Status: DC
Start: 2023-01-12 — End: 2023-04-14
  Filled 2023-01-12: qty 100, 100d supply, fill #0

## 2023-01-12 MED ORDER — LOVASTATIN 40 MG PO TABS
40.0000 mg | ORAL_TABLET | Freq: Every day | ORAL | 1 refills | Status: DC
Start: 2023-01-12 — End: 2023-04-14
  Filled 2023-01-12: qty 90, 90d supply, fill #0

## 2023-01-12 MED ORDER — BLOOD GLUCOSE TEST VI STRP
1.0000 | ORAL_STRIP | Freq: Every day | 1 refills | Status: DC
Start: 2023-01-12 — End: 2023-04-14
  Filled 2023-01-12: qty 100, 100d supply, fill #0

## 2023-01-12 MED ORDER — MUPIROCIN 2 % EX OINT
1.0000 | TOPICAL_OINTMENT | Freq: Two times a day (BID) | CUTANEOUS | 1 refills | Status: DC
  Filled 2023-01-12: qty 22, 22d supply, fill #0
  Filled 2023-01-27: qty 22, 22d supply, fill #1
  Filled 2023-02-18: qty 22, 22d supply, fill #2

## 2023-01-12 MED ORDER — GLIMEPIRIDE 4 MG PO TABS
4.0000 mg | ORAL_TABLET | Freq: Every day | ORAL | 1 refills | Status: DC
Start: 2023-01-12 — End: 2023-04-14
  Filled 2023-01-12 – 2023-02-05 (×2): qty 90, 90d supply, fill #0

## 2023-01-12 MED ORDER — FUROSEMIDE 20 MG PO TABS
20.0000 mg | ORAL_TABLET | ORAL | 1 refills | Status: DC
  Filled 2023-01-12 – 2023-02-16 (×2): qty 45, 90d supply, fill #0

## 2023-01-12 NOTE — Progress Notes (Signed)
Skin tear to right forearm. 

## 2023-01-12 NOTE — Progress Notes (Signed)
Assessment & Plan:  Kristy Flores was seen today for diabetes.  Diagnoses and all orders for this visit:  Controlled type 2 diabetes mellitus with hyperglycemia, with long-term current use of insulin (HCC) -     POCT glycosylated hemoglobin (Hb A1C) -     CMP14+EGFR -     Blood Glucose Monitoring Suppl DEVI; 1 each by Does not apply route daily. May substitute to any manufacturer covered by patient's insurance. -     Glucose Blood (BLOOD GLUCOSE TEST STRIPS) STRP; 1 each by In Vitro route daily. May substitute to any manufacturer covered by patient's insurance. -     Lancet Device MISC; 1 each by Does not apply route daily. May substitute to any manufacturer covered by patient's insurance. -     Lancets Misc. MISC; 1 each by Does not apply route daily. May substitute to any manufacturer covered by patient's insurance.  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  Hypercholesterolemia -     Lipid Panel  Chronic kidney disease (CKD) stage G3b/A1, moderately decreased glomerular filtration rate (GFR) between 30-44 mL/min/1.73 square meter and albuminuria creatinine ratio less than 30 mg/g (HCC) -     CMP14+EGFR -     CBC with Differential    Patient has been counseled on age-appropriate routine health concerns for screening and prevention. These are reviewed and up-to-date. Referrals have been placed accordingly. Immunizations are up-to-date or declined.    Subjective:   Chief Complaint  Patient presents with   Diabetes   Diabetes Pertinent negatives for hypoglycemia include no dizziness, headaches or seizures. Pertinent negatives for diabetes include no blurred vision, no chest pain and no weight loss.   Kristy Flores 67 y.o. female presents to office today for follow up to DM and HTN.  She has a past  medical history of Depression, DM2, Hyperlipidemia, Hypertension, Insomnia, and Restless leg syndrome    Patient has been counseled on age-appropriate routine health concerns for screening and prevention. These are reviewed and up-to-date. Referrals have been placed accordingly. Immunizations are up-to-date or declined. MAMMOGRAM: Declines PAP SMEAR: Declines COLON CANCER SCREEN: Declines      HTN Her lisinopril was increased to 30 mg daily after her last visit due to elevated readings.  Blood pressure is well controlled today.  BP Readings from Last 3 Encounters:  01/12/23 106/69  08/12/22 (!) 148/77  07/15/22 (!) 144/59     DM 2 Diabetes is well controlled with basaglar 15 units daily. Weight is stable. Lab Results  Component Value Date   HGBA1C 6.2 01/12/2023     She has a small superficial skin tear on the left forearm. States is does not seem to be healing and continues red and with drainage.    Review of Systems  Constitutional:  Negative for fever, malaise/fatigue and weight loss.  HENT: Negative.  Negative for nosebleeds.   Eyes: Negative.  Negative for blurred vision, double vision and photophobia.  Respiratory: Negative.  Negative for cough and shortness of breath.   Cardiovascular: Negative.  Negative for chest pain, palpitations and leg swelling.  Gastrointestinal: Negative.  Negative for heartburn, nausea and vomiting.  Musculoskeletal: Negative.  Negative for myalgias.  Neurological: Negative.  Negative for dizziness, focal weakness, seizures and headaches.  Psychiatric/Behavioral: Negative.  Negative for suicidal ideas.     Past Medical History:  Diagnosis Date   Anemia    Anxiety    Depression    Diabetes mellitus without complication (HCC)    Headache    Hyperlipidemia    Hypertension    Insomnia    Melanoma (HCC)    Back   Pneumonia    Restless leg syndrome     Past Surgical History:  Procedure Laterality Date   ABDOMINAL HYSTERECTOMY      CESAREAN SECTION     CHOLECYSTECTOMY N/A 04/25/2022   Procedure: LAPAROSCOPIC CHOLECYSTECTOMY WITH ICG DYE;  Surgeon: Gaynelle Adu, MD;  Location: [facility];  Service: General;  Laterality: N/A;   ERCP N/A 02/13/2022   Procedure: ENDOSCOPIC RETROGRADE CHOLANGIOPANCREATOGRAPHY (ERCP);  Surgeon: Rachael Fee, MD;  Location: [facility];  Service: Gastroenterology;  Laterality: N/A;   MELANOMA EXCISION  1985   REMOVAL OF STONES  02/13/2022   Procedure: REMOVAL OF STONES;  Surgeon: Rachael Fee, MD;  Location: [facility];  Service: Gastroenterology;;   Dennison Mascot  02/13/2022   Procedure: Dennison Mascot;  Surgeon: Rachael Fee, MD;  Location: [facility];  Service: Gastroenterology;;   UPPER GI ENDOSCOPY      Family History  Problem Relation Age of Onset   Kidney disease Mother    Diabetes Mother    Hypertension Mother    Cancer Father     Social History Reviewed with no changes to be made today.   Outpatient Medications Prior to Visit  Medication Sig Dispense Refill   acetaminophen (TYLENOL) 500 MG tablet Take 1,000 mg by mouth every 6 (six) hours as needed for moderate pain.     allopurinol (ZYLOPRIM) 100 MG tablet Take 2 tablets (200 mg total) by mouth daily. 180 tablet 1   doxycycline (VIBRA-TABS) 100 MG tablet Take 1 tablet (100 mg total) by mouth 2 (two) times daily. 14 tablet 0   DULoxetine (CYMBALTA) 30 MG capsule Take 1 capsule (30 mg total) by mouth 2 (two) times daily. 60 capsule 0   fluticasone (FLONASE) 50 MCG/ACT nasal spray Place 2 sprays into both nostrils daily. 16 g 0   furosemide (LASIX) 20 MG tablet Take 1 tablet (20 mg total) by mouth every other day. 45 tablet 1   glimepiride (AMARYL) 4 MG tablet Take 1 tablet (4 mg total) by mouth daily before breakfast. 30 tablet 0   Insulin Glargine (BASAGLAR KWIKPEN) 100 UNIT/ML Inject 15 Units into the skin at bedtime. 15 mL 1   Insulin Pen Needle (B-D ULTRAFINE III SHORT PEN) 31G X 8 MM MISC USE AS INSTRUCTED.  INJECT INTO THE SKIN ONCE NIGHTLY. 100 each 0   lisinopril (ZESTRIL) 30 MG tablet Take 1 tablet (30 mg total) by mouth daily. DOSE CHANGE 30 tablet 6   lovastatin (MEVACOR) 40 MG tablet Take 1 tablet (40 mg total) by mouth at bedtime. 90 tablet 0   sodium bicarbonate 650 MG tablet Take 1 tablet (650 mg total) by mouth 2 (two) times daily. 60 tablet 11   Blood Glucose Monitoring Suppl (TRUE METRIX METER) w/Device KIT Use as instructed. Check blood glucose level by fingerstick twice per day. (Patient not taking: Reported on 07/15/2022) 1 kit 0   TRUEplus Lancets 28G MISC Use as instructed. Check blood glucose level by fingerstick twice per day. (Patient not taking: Reported on 07/15/2022) 100 each 3   No facility-administered medications prior to visit.    Allergies  Allergen Reactions   Chlorzoxazone Nausea And Vomiting   Floxuridine     Hypoglycemia / pass out  Codeine Nausea And Vomiting   Gabapentin Swelling    AKI   Nsaids Other (See Comments)    CKD   Penicillins     Broke out, stomach upset.    Victoza [Liraglutide]     Shut down kidneys        Objective:    BP 106/69 (BP Location: Left Arm, Patient Position: Sitting, Cuff Size: Normal)   Pulse 76   Ht 5\' 1"  (1.549 m)   Wt 216 lb 12.8 oz (98.3 kg)   SpO2 98%   BMI 40.96 kg/m  Wt Readings from Last 3 Encounters:  01/12/23 216 lb 12.8 oz (98.3 kg)  08/12/22 217 lb 8 oz (98.7 kg)  07/15/22 212 lb (96.2 kg)    Physical Exam Vitals and nursing note reviewed.  Constitutional:      Appearance: She is well-developed.  HENT:     Head: Normocephalic and atraumatic.  Cardiovascular:     Rate and Rhythm: Normal rate and regular rhythm.     Heart sounds: Normal heart sounds. No murmur heard.    No friction rub. No gallop.  Pulmonary:     Effort: Pulmonary effort is normal. No tachypnea or respiratory distress.     Breath sounds: Normal breath sounds. No decreased breath sounds, wheezing, rhonchi or rales.  Chest:      Chest wall: No tenderness.  Abdominal:     General: Bowel sounds are normal.     Palpations: Abdomen is soft.  Musculoskeletal:        General: Normal range of motion.     Cervical back: Normal range of motion.  Skin:    General: Skin is warm and dry.  Neurological:     Mental Status: She is alert and oriented to person, place, and time.     Coordination: Coordination normal.  Psychiatric:        Behavior: Behavior normal. Behavior is cooperative.        Thought Content: Thought content normal.        Judgment: Judgment normal.          Patient has been counseled extensively about nutrition and exercise as well as the importance of adherence with medications and regular follow-up. The patient was given clear instructions to go to ER or return to medical center if symptoms don't improve, worsen or new problems develop. The patient verbalized understanding.   Follow-up: Return in about 3 months (around 04/14/2023).   Claiborne Rigg, FNP-BC [facility] [place], [place] [phone]   01/12/2023, 9:12 AM

## 2023-01-13 ENCOUNTER — Other Ambulatory Visit: Payer: Self-pay

## 2023-01-13 ENCOUNTER — Ambulatory Visit (HOSPITAL_COMMUNITY)
Admission: RE | Admit: 2023-01-13 | Discharge: 2023-01-13 | Disposition: A | Discharge status: Home / Self Care | Payer: Medicare HMO | Source: Ambulatory Visit | Attending: General Surgery | Admitting: General Surgery

## 2023-01-13 DIAGNOSIS — K439 Ventral hernia without obstruction or gangrene: Secondary | ICD-10-CM | POA: Insufficient documentation

## 2023-01-13 DIAGNOSIS — K429 Umbilical hernia without obstruction or gangrene: Secondary | ICD-10-CM | POA: Diagnosis not present

## 2023-01-13 LAB — CMP14+EGFR
ALT: 14 IU/L (ref 0–32)
AST: 15 IU/L (ref 0–40)
Albumin: 3.9 g/dL (ref 3.9–4.9)
Alkaline Phosphatase: 65 IU/L (ref 44–121)
BUN/Creatinine Ratio: 40 — ABNORMAL HIGH (ref 12–28)
BUN: 70 mg/dL — ABNORMAL HIGH (ref 8–27)
Bilirubin Total: 0.2 mg/dL (ref 0.0–1.2)
CO2: 17 mmol/L — ABNORMAL LOW (ref 20–29)
Calcium: 9.9 mg/dL (ref 8.7–10.3)
Chloride: 109 mmol/L — ABNORMAL HIGH (ref 96–106)
Creatinine, Ser: 1.75 mg/dL — ABNORMAL HIGH (ref 0.57–1.00)
Globulin, Total: 2.5 g/dL (ref 1.5–4.5)
Glucose: 137 mg/dL — ABNORMAL HIGH (ref 70–99)
Potassium: 5.1 mmol/L (ref 3.5–5.2)
Sodium: 140 mmol/L (ref 134–144)
Total Protein: 6.4 g/dL (ref 6.0–8.5)
eGFR: 32 mL/min/{1.73_m2} — ABNORMAL LOW (ref >59)

## 2023-01-13 LAB — CBC WITH DIFFERENTIAL/PLATELET
Basophils Absolute: 0.1 10*3/uL (ref 0.0–0.2)
Basos: 1 %
EOS (ABSOLUTE): 0.3 10*3/uL (ref 0.0–0.4)
Eos: 3 %
Hematocrit: 37.3 % (ref 34.0–46.6)
Hemoglobin: 12.1 g/dL (ref 11.1–15.9)
Immature Grans (Abs): 0.1 10*3/uL (ref 0.0–0.1)
Immature Granulocytes: 1 %
Lymphocytes Absolute: 1.8 10*3/uL (ref 0.7–3.1)
Lymphs: 24 %
MCH: 28.8 pg (ref 26.6–33.0)
MCHC: 32.4 g/dL (ref 31.5–35.7)
MCV: 89 fL (ref 79–97)
Monocytes Absolute: 0.5 10*3/uL (ref 0.1–0.9)
Monocytes: 7 %
Neutrophils Absolute: 4.6 10*3/uL (ref 1.4–7.0)
Neutrophils: 64 %
Platelets: 264 10*3/uL (ref 150–450)
RBC: 4.2 x10E6/uL (ref 3.77–5.28)
RDW: 14.4 % (ref 11.7–15.4)
WBC: 7.3 10*3/uL (ref 3.4–10.8)

## 2023-01-13 LAB — LIPID PANEL
Chol/HDL Ratio: 4.3 ratio (ref 0.0–4.4)
Cholesterol, Total: 193 mg/dL (ref 100–199)
HDL: 45 mg/dL (ref >39)
LDL Chol Calc (NIH): 120 mg/dL — ABNORMAL HIGH (ref 0–99)
Triglycerides: 158 mg/dL — ABNORMAL HIGH (ref 0–149)
VLDL Cholesterol Cal: 28 mg/dL (ref 5–40)

## 2023-01-13 MED ORDER — IOHEXOL 9 MG/ML PO SOLN
ORAL | Status: AC
  Filled 2023-01-13: qty 1000

## 2023-01-13 MED ORDER — IOHEXOL 9 MG/ML PO SOLN
1000.0000 mL | ORAL | Status: AC
  Administered 2023-01-13: 1000 mL via ORAL

## 2023-01-16 ENCOUNTER — Other Ambulatory Visit: Payer: Self-pay

## 2023-01-19 ENCOUNTER — Other Ambulatory Visit: Payer: Self-pay

## 2023-01-19 MED ORDER — SODIUM BICARBONATE 650 MG PO TABS
650.0000 mg | ORAL_TABLET | Freq: Two times a day (BID) | ORAL | 11 refills | Status: DC
  Filled 2023-01-19 – 2023-01-28 (×2): qty 60, 30d supply, fill #0
  Filled 2023-02-22: qty 60, 30d supply, fill #1
  Filled 2023-03-30: qty 60, 30d supply, fill #2
  Filled 2023-04-27: qty 60, 30d supply, fill #3
  Filled 2023-06-04: qty 60, 30d supply, fill #4
  Filled 2023-06-30: qty 60, 30d supply, fill #5
  Filled 2023-07-30: qty 60, 30d supply, fill #6
  Filled 2023-09-02: qty 60, 30d supply, fill #7
  Filled 2023-10-05: qty 60, 30d supply, fill #8
  Filled 2023-11-04: qty 60, 30d supply, fill #9
  Filled 2023-11-30: qty 60, 30d supply, fill #10
  Filled 2024-01-04: qty 60, 30d supply, fill #11

## 2023-01-21 ENCOUNTER — Other Ambulatory Visit: Payer: Self-pay

## 2023-01-23 ENCOUNTER — Other Ambulatory Visit: Payer: Self-pay

## 2023-01-26 ENCOUNTER — Other Ambulatory Visit: Payer: Self-pay

## 2023-01-28 ENCOUNTER — Other Ambulatory Visit: Payer: Self-pay

## 2023-02-05 ENCOUNTER — Other Ambulatory Visit: Payer: Self-pay

## 2023-02-05 DIAGNOSIS — N1832 Chronic kidney disease, stage 3b: Secondary | ICD-10-CM | POA: Diagnosis not present

## 2023-02-05 DIAGNOSIS — K432 Incisional hernia without obstruction or gangrene: Secondary | ICD-10-CM | POA: Diagnosis not present

## 2023-02-05 DIAGNOSIS — M6208 Separation of muscle (nontraumatic), other site: Secondary | ICD-10-CM | POA: Diagnosis not present

## 2023-02-05 DIAGNOSIS — E668 Other obesity: Secondary | ICD-10-CM | POA: Diagnosis not present

## 2023-02-05 DIAGNOSIS — E1169 Type 2 diabetes mellitus with other specified complication: Secondary | ICD-10-CM | POA: Diagnosis not present

## 2023-02-09 ENCOUNTER — Ambulatory Visit: Payer: Self-pay | Admitting: General Surgery

## 2023-02-09 NOTE — Progress Notes (Signed)
Sent message, via epic in basket, requesting orders in epic from surgeon.  

## 2023-02-10 NOTE — Patient Instructions (Signed)
DUE TO COVID-19 ONLY TWO VISITORS  (aged 65 and older)  ARE ALLOWED TO COME WITH YOU AND STAY IN THE WAITING ROOM ONLY DURING PRE OP AND PROCEDURE.   **NO VISITORS ARE ALLOWED IN THE SHORT STAY AREA OR RECOVERY ROOM!!**  IF YOU WILL BE ADMITTED INTO THE HOSPITAL YOU ARE ALLOWED ONLY FOUR SUPPORT PEOPLE DURING VISITATION HOURS ONLY (7 AM -8PM)   The support person(s) must pass our screening, gel in and out, and wear a mask at all times, including in the patient's room. Patients must also wear a mask when staff or their support person are in the room. Visitors GUEST BADGE MUST BE WORN VISIBLY  One adult visitor may remain with you overnight and MUST be in the room by 8 P.M.     Your procedure is scheduled on: 02/24/23   Report to [facility] Main Entrance    Report to admitting at : 11:45 AM   Call this number if you have problems the morning of surgery [phone]   Do not eat food :After Midnight.   After Midnight you may have the following liquids until : 11:00 AM DAY OF SURGERY  Water Black Coffee (sugar ok, NO MILK/CREAM OR CREAMERS)  Tea (sugar ok, NO MILK/CREAM OR CREAMERS) regular and decaf                             Plain Jell-O (NO RED)                                           Fruit ices (not with fruit pulp, NO RED)                                     Popsicles (NO RED)                                                                  Juice: apple, WHITE grape, WHITE cranberry Sports drinks like Gatorade (NO RED)  Oral Hygiene is also important to reduce your risk of infection.                                    Remember - BRUSH YOUR TEETH THE MORNING OF SURGERY WITH YOUR REGULAR TOOTHPASTE  DENTURES WILL BE REMOVED PRIOR TO SURGERY PLEASE DO NOT APPLY "Poly grip" OR ADHESIVES!!!   Do NOT smoke after Midnight   Take these medicines the morning of surgery with A SIP OF WATER: duloxetine,allopurinol.  DO NOT TAKE ANY ORAL DIABETIC MEDICATIONS DAY OF YOUR  SURGERY  How to Manage Your Diabetes Before and After Surgery  Why is it important to control my blood sugar before and after surgery? Improving blood sugar levels before and after surgery helps healing and can limit problems. A way of improving blood sugar control is eating a healthy diet by:  Eating less sugar and carbohydrates  Increasing activity/exercise  Talking with your doctor about reaching your blood  sugar goals High blood sugars (greater than 180 mg/dL) can raise your risk of infections and slow your recovery, so you will need to focus on controlling your diabetes during the weeks before surgery. Make sure that the doctor who takes care of your diabetes knows about your planned surgery including the date and location.  How do I manage my blood sugar before surgery? Check your blood sugar at least 4 times a day, starting 2 days before surgery, to make sure that the level is not too high or low. Check your blood sugar the morning of your surgery when you wake up and every 2 hours until you get to the Short Stay unit. If your blood sugar is less than 70 mg/dL, you will need to treat for low blood sugar: Do not take insulin. Treat a low blood sugar (less than 70 mg/dL) with  cup of clear juice (cranberry or apple), 4 glucose tablets, OR glucose gel. Recheck blood sugar in 15 minutes after treatment (to make sure it is greater than 70 mg/dL). If your blood sugar is not greater than 70 mg/dL on recheck, call [phone] for further instructions. Report your blood sugar to the short stay nurse when you get to Short Stay.  If you are admitted to the hospital after surgery: Your blood sugar will be checked by the staff and you will probably be given insulin after surgery (instead of oral diabetes medicines) to make sure you have good blood sugar levels. The goal for blood sugar control after surgery is 80-180 mg/dL.   WHAT DO I DO ABOUT MY DIABETES MEDICATION?  Do not take oral  diabetes medicines (pills) the morning of surgery.  THE NIGHT BEFORE SURGERY, take ONLY half of the basaglar insulin dose.     THE MORNING OF SURGERY, Do not take oral diabetes medicines (pills) the morning of surgery.  DO NOT TAKE THE FOLLOWING 7 DAYS PRIOR TO SURGERY: Ozempic, Wegovy, Rybelsus (Semaglutide), Byetta (exenatide), Bydureon (exenatide ER), Victoza, Saxenda (liraglutide), or Trulicity (dulaglutide) Mounjaro (Tirzepatide) Adlyxin (Lixisenatide), Polyethylene Glycol Loxenatide.                    You may not have any metal on your body including hair pins, jewelry, and body piercing             Do not wear make-up, lotions, powders, perfumes/cologne, or deodorant  Do not wear nail polish including gel and S&S, artificial/acrylic nails, or any other type of covering on natural nails including finger and toenails. If you have artificial nails, gel coating, etc. that needs to be removed by a nail salon please have this removed prior to surgery or surgery may need to be canceled/ delayed if the surgeon/ anesthesia feels like they are unable to be safely monitored.   Do not shave  48 hours prior to surgery.    Do not bring valuables to the hospital. [facility] IS NOT             RESPONSIBLE   FOR VALUABLES.   Contacts, glasses, or bridgework may not be worn into surgery.   Bring small overnight bag day of surgery.   DO NOT BRING YOUR HOME MEDICATIONS TO THE HOSPITAL. PHARMACY WILL DISPENSE MEDICATIONS LISTED ON YOUR MEDICATION LIST TO YOU DURING YOUR ADMISSION IN THE HOSPITAL!    Patients discharged on the day of surgery will not be allowed to drive home.  Someone NEEDS to stay with you for the first 24 hours after  anesthesia.   Special Instructions: Bring a copy of your healthcare power of attorney and living will documents         the day of surgery if you haven't scanned them before.              Please read over the following fact sheets you were given: IF YOU HAVE QUESTIONS  ABOUT YOUR PRE-OP INSTRUCTIONS PLEASE CALL [phone]    [facility] - Preparing for Surgery Before surgery, you can play an important role.  Because skin is not sterile, your skin needs to be as free of germs as possible.  You can reduce the number of germs on your skin by washing with CHG (chlorahexidine gluconate) soap before surgery.  CHG is an antiseptic cleaner which kills germs and bonds with the skin to continue killing germs even after washing. Please DO NOT use if you have an allergy to CHG or antibacterial soaps.  If your skin becomes reddened/irritated stop using the CHG and inform your nurse when you arrive at Short Stay. Do not shave (including legs and underarms) for at least 48 hours prior to the first CHG shower.  You may shave your face/neck. Please follow these instructions carefully:  1.  Shower with CHG Soap the night before surgery and the  morning of Surgery.  2.  If you choose to wash your hair, wash your hair first as usual with your  normal  shampoo.  3.  After you shampoo, rinse your hair and body thoroughly to remove the  shampoo.                           4.  Use CHG as you would any other liquid soap.  You can apply chg directly  to the skin and wash                       Gently with a scrungie or clean washcloth.  5.  Apply the CHG Soap to your body ONLY FROM THE NECK DOWN.   Do not use on face/ open                           Wound or open sores. Avoid contact with eyes, ears mouth and genitals (private parts).                       Wash face,  Genitals (private parts) with your normal soap.             6.  Wash thoroughly, paying special attention to the area where your surgery  will be performed.  7.  Thoroughly rinse your body with warm water from the neck down.  8.  DO NOT shower/wash with your normal soap after using and rinsing off  the CHG Soap.                9.  Pat yourself dry with a clean towel.            10.  Wear clean pajamas.            11.  Place  clean sheets on your bed the night of your first shower and do not  sleep with pets. Day of Surgery : Do not apply any lotions/deodorants the morning of surgery.  Please wear clean clothes to the hospital/surgery center.  FAILURE TO FOLLOW THESE  INSTRUCTIONS MAY RESULT IN THE CANCELLATION OF YOUR SURGERY PATIENT SIGNATURE_________________________________  NURSE SIGNATURE__________________________________  ________________________________________________________________________

## 2023-02-11 ENCOUNTER — Other Ambulatory Visit: Payer: Self-pay

## 2023-02-11 ENCOUNTER — Encounter (HOSPITAL_COMMUNITY): Payer: Self-pay

## 2023-02-11 ENCOUNTER — Encounter (HOSPITAL_COMMUNITY)
Admission: RE | Admit: 2023-02-11 | Discharge: 2023-02-11 | Disposition: A | Discharge status: Home / Self Care | Payer: Medicare HMO | Source: Ambulatory Visit | Attending: General Surgery | Admitting: General Surgery

## 2023-02-11 VITALS — BP 149/66 | HR 84 | Temp 98.0°F | Ht 61.0 in | Wt 213.0 lb

## 2023-02-11 DIAGNOSIS — E1165 Type 2 diabetes mellitus with hyperglycemia: Secondary | ICD-10-CM | POA: Insufficient documentation

## 2023-02-11 DIAGNOSIS — I10 Essential (primary) hypertension: Secondary | ICD-10-CM | POA: Insufficient documentation

## 2023-02-11 DIAGNOSIS — Z01812 Encounter for preprocedural laboratory examination: Secondary | ICD-10-CM | POA: Insufficient documentation

## 2023-02-11 DIAGNOSIS — Z794 Long term (current) use of insulin: Secondary | ICD-10-CM | POA: Insufficient documentation

## 2023-02-11 DIAGNOSIS — Z01818 Encounter for other preprocedural examination: Secondary | ICD-10-CM | POA: Diagnosis not present

## 2023-02-11 HISTORY — DX: Chronic kidney disease, unspecified: N18.9

## 2023-02-11 LAB — BASIC METABOLIC PANEL
Anion gap: 9 (ref 5–15)
BUN: 98 mg/dL — ABNORMAL HIGH (ref 8–23)
CO2: 19 mmol/L — ABNORMAL LOW (ref 22–32)
Calcium: 9.6 mg/dL (ref 8.9–10.3)
Chloride: 108 mmol/L (ref 98–111)
Creatinine, Ser: 1.98 mg/dL — ABNORMAL HIGH (ref 0.44–1.00)
GFR, Estimated: 28 mL/min — ABNORMAL LOW (ref >60)
Glucose, Bld: 151 mg/dL — ABNORMAL HIGH (ref 70–99)
Potassium: 5 mmol/L (ref 3.5–5.1)
Sodium: 136 mmol/L (ref 135–145)

## 2023-02-11 LAB — CBC
HCT: 37.3 % (ref 36.0–46.0)
Hemoglobin: 11.7 g/dL — ABNORMAL LOW (ref 12.0–15.0)
MCH: 28.9 pg (ref 26.0–34.0)
MCHC: 31.4 g/dL (ref 30.0–36.0)
MCV: 92.1 fL (ref 80.0–100.0)
Platelets: 249 10*3/uL (ref 150–400)
RBC: 4.05 MIL/uL (ref 3.87–5.11)
RDW: 14.4 % (ref 11.5–15.5)
WBC: 10.8 10*3/uL — ABNORMAL HIGH (ref 4.0–10.5)
nRBC: 0 % (ref 0.0–0.2)

## 2023-02-11 LAB — GLUCOSE, CAPILLARY: Glucose-Capillary: 162 mg/dL — ABNORMAL HIGH (ref 70–99)

## 2023-02-11 NOTE — Progress Notes (Signed)
For Short Stay: COVID SWAB appointment date:  Bowel Prep reminder:   For Anesthesia: PCP - Claiborne Rigg, NP. LOV: 01/12/23 Cardiologist - N/A  Chest x-ray -  EKG - 04/16/22 Stress Test -  ECHO -  Cardiac Cath -  Pacemaker/ICD device last checked: Pacemaker orders received: Device Rep notified:  Spinal Cord Stimulator: N/A  Sleep Study - N/A CPAP -   Fasting Blood Sugar - 90's - 100's Checks Blood Sugar ___2__ times a day Date and result of last Hgb A1c- 6.2: 01/12/23  Last dose of GLP1 agonist- N/A GLP1 instructions:   Last dose of SGLT-2 inhibitors- N/A SGLT-2 instructions:   Blood Thinner Instructions: N/A Aspirin Instructions: Last Dose:  Activity level: Can go up a flight of stairs and activities of daily living without stopping and without chest pain and/or shortness of breath   Able to exercise without chest pain and/or shortness of breath  Anesthesia review: Hx: DIA,HTN  Patient denies shortness of breath, fever, cough and chest pain at PAT appointment   Patient verbalized understanding of instructions that were given to them at the PAT appointment. Patient was also instructed that they will need to review over the PAT instructions again at home before surgery.

## 2023-02-11 NOTE — Progress Notes (Signed)
Lab. Results: Creatinine: 1.98

## 2023-02-16 ENCOUNTER — Other Ambulatory Visit: Payer: Self-pay

## 2023-02-18 ENCOUNTER — Other Ambulatory Visit: Payer: Self-pay | Admitting: Nurse Practitioner

## 2023-02-18 ENCOUNTER — Other Ambulatory Visit: Payer: Self-pay

## 2023-02-18 DIAGNOSIS — L089 Local infection of the skin and subcutaneous tissue, unspecified: Secondary | ICD-10-CM

## 2023-02-18 MED ORDER — MUPIROCIN 2 % EX OINT
1.0000 | TOPICAL_OINTMENT | Freq: Two times a day (BID) | CUTANEOUS | 1 refills | Status: DC
  Filled 2023-02-18: qty 22, 22d supply, fill #0
  Filled 2023-03-14 – 2023-06-04 (×2): qty 22, 22d supply, fill #1

## 2023-02-23 ENCOUNTER — Other Ambulatory Visit: Payer: Self-pay

## 2023-02-24 ENCOUNTER — Observation Stay (HOSPITAL_COMMUNITY)
Admission: RE | Admit: 2023-02-24 | Discharge: 2023-02-25 | Disposition: A | Discharge status: Home / Self Care | Payer: Medicare HMO | Source: Ambulatory Visit | Attending: General Surgery | Admitting: General Surgery

## 2023-02-24 ENCOUNTER — Encounter (HOSPITAL_COMMUNITY): Payer: Self-pay | Admitting: General Surgery

## 2023-02-24 ENCOUNTER — Other Ambulatory Visit: Payer: Self-pay

## 2023-02-24 ENCOUNTER — Encounter (HOSPITAL_COMMUNITY)
Admission: RE | Disposition: A | Discharge status: Home / Self Care | Payer: Self-pay | Source: Ambulatory Visit | Attending: General Surgery

## 2023-02-24 ENCOUNTER — Ambulatory Visit (HOSPITAL_COMMUNITY): Payer: Medicare HMO | Admitting: Certified Registered Nurse Anesthetist

## 2023-02-24 ENCOUNTER — Ambulatory Visit (HOSPITAL_BASED_OUTPATIENT_CLINIC_OR_DEPARTMENT_OTHER): Payer: Medicare HMO | Admitting: Certified Registered Nurse Anesthetist

## 2023-02-24 DIAGNOSIS — Z79899 Other long term (current) drug therapy: Secondary | ICD-10-CM | POA: Insufficient documentation

## 2023-02-24 DIAGNOSIS — E1122 Type 2 diabetes mellitus with diabetic chronic kidney disease: Secondary | ICD-10-CM | POA: Diagnosis not present

## 2023-02-24 DIAGNOSIS — N1832 Chronic kidney disease, stage 3b: Secondary | ICD-10-CM | POA: Insufficient documentation

## 2023-02-24 DIAGNOSIS — I129 Hypertensive chronic kidney disease with stage 1 through stage 4 chronic kidney disease, or unspecified chronic kidney disease: Secondary | ICD-10-CM | POA: Insufficient documentation

## 2023-02-24 DIAGNOSIS — K432 Incisional hernia without obstruction or gangrene: Secondary | ICD-10-CM

## 2023-02-24 DIAGNOSIS — Z8719 Personal history of other diseases of the digestive system: Principal | ICD-10-CM

## 2023-02-24 HISTORY — PX: INCISIONAL HERNIA REPAIR: SHX193

## 2023-02-24 LAB — GLUCOSE, CAPILLARY
Glucose-Capillary: 75 mg/dL (ref 70–99)
Glucose-Capillary: 135 mg/dL — ABNORMAL HIGH (ref 70–99)
Glucose-Capillary: 194 mg/dL — ABNORMAL HIGH (ref 70–99)

## 2023-02-24 SURGERY — REPAIR, HERNIA, INCISIONAL, LAPAROSCOPIC
Anesthesia: General

## 2023-02-24 MED ORDER — SUGAMMADEX SODIUM 200 MG/2ML IV SOLN
INTRAVENOUS | Status: DC | PRN
  Administered 2023-02-24: 200 mg via INTRAVENOUS

## 2023-02-24 MED ORDER — GLIMEPIRIDE 4 MG PO TABS
4.0000 mg | ORAL_TABLET | Freq: Every day | ORAL | Status: DC
  Administered 2023-02-25: 4 mg via ORAL
  Filled 2023-02-24: qty 1

## 2023-02-24 MED ORDER — INSULIN ASPART 100 UNIT/ML IJ SOLN: 0.0000 [IU] | Freq: Every day | INTRAMUSCULAR | Status: DC

## 2023-02-24 MED ORDER — INSULIN ASPART 100 UNIT/ML IJ SOLN: 0.0000 [IU] | INTRAMUSCULAR | Status: DC | PRN

## 2023-02-24 MED ORDER — LISINOPRIL 20 MG PO TABS
30.0000 mg | ORAL_TABLET | Freq: Every day | ORAL | Status: DC
  Administered 2023-02-24 – 2023-02-25 (×2): 30 mg via ORAL
  Filled 2023-02-24 (×2): qty 1

## 2023-02-24 MED ORDER — ZOLPIDEM TARTRATE 5 MG PO TABS: 5.0000 mg | ORAL_TABLET | Freq: Every evening | ORAL | Status: DC | PRN

## 2023-02-24 MED ORDER — OXYCODONE HCL 5 MG PO TABS
5.0000 mg | ORAL_TABLET | ORAL | Status: DC | PRN
  Administered 2023-02-24: 10 mg via ORAL
  Administered 2023-02-25 (×2): 5 mg via ORAL
  Filled 2023-02-24: qty 2
  Filled 2023-02-24 (×2): qty 1
  Filled 2023-02-24: qty 2

## 2023-02-24 MED ORDER — PHENYLEPHRINE 80 MCG/ML (10ML) SYRINGE FOR IV PUSH (FOR BLOOD PRESSURE SUPPORT)
PREFILLED_SYRINGE | INTRAVENOUS | Status: AC
  Filled 2023-02-24: qty 10

## 2023-02-24 MED ORDER — LIDOCAINE HCL (PF) 2 % IJ SOLN
INTRAMUSCULAR | Status: AC
  Filled 2023-02-24: qty 5

## 2023-02-24 MED ORDER — DEXAMETHASONE SODIUM PHOSPHATE 10 MG/ML IJ SOLN
INTRAMUSCULAR | Status: DC | PRN
  Administered 2023-02-24: 10 mg via INTRAVENOUS

## 2023-02-24 MED ORDER — PROPOFOL 10 MG/ML IV BOLUS
INTRAVENOUS | Status: AC
  Filled 2023-02-24: qty 20

## 2023-02-24 MED ORDER — ONDANSETRON HCL 4 MG/2ML IJ SOLN
INTRAMUSCULAR | Status: DC | PRN
  Administered 2023-02-24: 4 mg via INTRAVENOUS

## 2023-02-24 MED ORDER — CHLORHEXIDINE GLUCONATE CLOTH 2 % EX PADS: 6.0000 | MEDICATED_PAD | Freq: Once | CUTANEOUS | Status: DC

## 2023-02-24 MED ORDER — INSULIN ASPART 100 UNIT/ML IJ SOLN
0.0000 [IU] | Freq: Three times a day (TID) | INTRAMUSCULAR | Status: DC
  Administered 2023-02-25: 3 [IU] via SUBCUTANEOUS

## 2023-02-24 MED ORDER — PHENYLEPHRINE 80 MCG/ML (10ML) SYRINGE FOR IV PUSH (FOR BLOOD PRESSURE SUPPORT)
PREFILLED_SYRINGE | INTRAVENOUS | Status: DC | PRN
  Administered 2023-02-24: 160 ug via INTRAVENOUS

## 2023-02-24 MED ORDER — PROPOFOL 10 MG/ML IV BOLUS
INTRAVENOUS | Status: DC | PRN
Start: 2023-02-24 — End: 2023-02-24
  Administered 2023-02-24: 170 mg via INTRAVENOUS

## 2023-02-24 MED ORDER — FENTANYL CITRATE PF 50 MCG/ML IJ SOSY
PREFILLED_SYRINGE | INTRAMUSCULAR | Status: AC
  Filled 2023-02-24: qty 1

## 2023-02-24 MED ORDER — LACTATED RINGERS IV SOLN: INTRAVENOUS | Status: DC

## 2023-02-24 MED ORDER — LACTATED RINGERS IR SOLN
Status: DC | PRN
  Administered 2023-02-24: 1000 mL

## 2023-02-24 MED ORDER — BUPIVACAINE-EPINEPHRINE 0.25% -1:200000 IJ SOLN
INTRAMUSCULAR | Status: DC | PRN
  Administered 2023-02-24: 50 mL

## 2023-02-24 MED ORDER — ROCURONIUM BROMIDE 10 MG/ML (PF) SYRINGE
PREFILLED_SYRINGE | INTRAVENOUS | Status: DC | PRN
  Administered 2023-02-24: 10 mg via INTRAVENOUS
  Administered 2023-02-24: 60 mg via INTRAVENOUS

## 2023-02-24 MED ORDER — CHLORHEXIDINE GLUCONATE 0.12 % MT SOLN
15.0000 mL | Freq: Once | OROMUCOSAL | Status: AC
  Administered 2023-02-24: 15 mL via OROMUCOSAL

## 2023-02-24 MED ORDER — DEXAMETHASONE SODIUM PHOSPHATE 10 MG/ML IJ SOLN
INTRAMUSCULAR | Status: AC
  Filled 2023-02-24: qty 1

## 2023-02-24 MED ORDER — ONDANSETRON HCL 4 MG/2ML IJ SOLN
INTRAMUSCULAR | Status: AC
  Filled 2023-02-24: qty 2

## 2023-02-24 MED ORDER — METHOCARBAMOL 500 MG PO TABS
500.0000 mg | ORAL_TABLET | Freq: Four times a day (QID) | ORAL | Status: DC | PRN
  Administered 2023-02-24 – 2023-02-25 (×2): 500 mg via ORAL
  Filled 2023-02-24 (×2): qty 1

## 2023-02-24 MED ORDER — LIDOCAINE 2% (20 MG/ML) 5 ML SYRINGE
INTRAMUSCULAR | Status: DC | PRN
  Administered 2023-02-24: 100 mg via INTRAVENOUS

## 2023-02-24 MED ORDER — ALLOPURINOL 100 MG PO TABS
200.0000 mg | ORAL_TABLET | Freq: Every day | ORAL | Status: DC
  Administered 2023-02-24 – 2023-02-25 (×2): 200 mg via ORAL
  Filled 2023-02-24 (×2): qty 2

## 2023-02-24 MED ORDER — ACETAMINOPHEN 500 MG PO TABS
1000.0000 mg | ORAL_TABLET | ORAL | Status: AC
  Administered 2023-02-24: 1000 mg via ORAL
  Filled 2023-02-24: qty 2

## 2023-02-24 MED ORDER — ONDANSETRON HCL 4 MG/2ML IJ SOLN: 4.0000 mg | Freq: Four times a day (QID) | INTRAMUSCULAR | Status: DC | PRN

## 2023-02-24 MED ORDER — DOCUSATE SODIUM 100 MG PO CAPS
100.0000 mg | ORAL_CAPSULE | Freq: Two times a day (BID) | ORAL | Status: DC
  Administered 2023-02-24 – 2023-02-25 (×2): 100 mg via ORAL
  Filled 2023-02-24 (×2): qty 1

## 2023-02-24 MED ORDER — ORAL CARE MOUTH RINSE: 15.0000 mL | Freq: Once | OROMUCOSAL | Status: AC

## 2023-02-24 MED ORDER — BUPIVACAINE-EPINEPHRINE 0.25% -1:200000 IJ SOLN
INTRAMUSCULAR | Status: AC
  Filled 2023-02-24: qty 1

## 2023-02-24 MED ORDER — HEPARIN SODIUM (PORCINE) 5000 UNIT/ML IJ SOLN
5000.0000 [IU] | Freq: Three times a day (TID) | INTRAMUSCULAR | Status: DC
  Administered 2023-02-25: 5000 [IU] via SUBCUTANEOUS
  Filled 2023-02-24: qty 1

## 2023-02-24 MED ORDER — FENTANYL CITRATE (PF) 250 MCG/5ML IJ SOLN
INTRAMUSCULAR | Status: AC
  Filled 2023-02-24: qty 5

## 2023-02-24 MED ORDER — ROCURONIUM BROMIDE 10 MG/ML (PF) SYRINGE
PREFILLED_SYRINGE | INTRAVENOUS | Status: AC
  Filled 2023-02-24: qty 10

## 2023-02-24 MED ORDER — FENTANYL CITRATE (PF) 250 MCG/5ML IJ SOLN
INTRAMUSCULAR | Status: DC | PRN
  Administered 2023-02-24 (×3): 50 ug via INTRAVENOUS
  Administered 2023-02-24: 100 ug via INTRAVENOUS

## 2023-02-24 MED ORDER — ONDANSETRON HCL 4 MG/2ML IJ SOLN: 4.0000 mg | Freq: Once | INTRAMUSCULAR | Status: DC | PRN

## 2023-02-24 MED ORDER — DIPHENHYDRAMINE HCL 50 MG/ML IJ SOLN: 12.5000 mg | Freq: Four times a day (QID) | INTRAMUSCULAR | Status: DC | PRN

## 2023-02-24 MED ORDER — DULOXETINE HCL 60 MG PO CPEP
60.0000 mg | ORAL_CAPSULE | Freq: Every day | ORAL | Status: DC
  Administered 2023-02-24 – 2023-02-25 (×2): 60 mg via ORAL
  Filled 2023-02-24 (×2): qty 1

## 2023-02-24 MED ORDER — ACETAMINOPHEN 500 MG PO TABS
1000.0000 mg | ORAL_TABLET | Freq: Four times a day (QID) | ORAL | Status: DC
  Administered 2023-02-25 (×2): 1000 mg via ORAL
  Filled 2023-02-24 (×2): qty 2

## 2023-02-24 MED ORDER — SIMETHICONE 80 MG PO CHEW: 40.0000 mg | CHEWABLE_TABLET | Freq: Four times a day (QID) | ORAL | Status: DC | PRN

## 2023-02-24 MED ORDER — POTASSIUM CHLORIDE IN NACL 20-0.9 MEQ/L-% IV SOLN
INTRAVENOUS | Status: DC
  Filled 2023-02-24: qty 1000

## 2023-02-24 MED ORDER — FENTANYL CITRATE PF 50 MCG/ML IJ SOSY: 25.0000 ug | PREFILLED_SYRINGE | INTRAMUSCULAR | Status: DC | PRN

## 2023-02-24 MED ORDER — MIDAZOLAM HCL 2 MG/2ML IJ SOLN
INTRAMUSCULAR | Status: AC
  Filled 2023-02-24: qty 2

## 2023-02-24 MED ORDER — 0.9 % SODIUM CHLORIDE (POUR BTL) OPTIME
TOPICAL | Status: DC | PRN
Start: 2023-02-24 — End: 2023-02-24
  Administered 2023-02-24: 1000 mL

## 2023-02-24 MED ORDER — ONDANSETRON 4 MG PO TBDP: 4.0000 mg | ORAL_TABLET | Freq: Four times a day (QID) | ORAL | Status: DC | PRN

## 2023-02-24 MED ORDER — FENTANYL CITRATE PF 50 MCG/ML IJ SOSY
25.0000 ug | PREFILLED_SYRINGE | INTRAMUSCULAR | Status: DC | PRN
  Administered 2023-02-24: 50 ug via INTRAVENOUS
  Administered 2023-02-24 (×3): 25 ug via INTRAVENOUS

## 2023-02-24 MED ORDER — VANCOMYCIN HCL 1500 MG/300ML IV SOLN
1500.0000 mg | INTRAVENOUS | Status: AC
  Administered 2023-02-24 (×2): 1500 mg via INTRAVENOUS
  Filled 2023-02-24: qty 300

## 2023-02-24 MED ORDER — AMISULPRIDE (ANTIEMETIC) 5 MG/2ML IV SOLN: 10.0000 mg | Freq: Once | INTRAVENOUS | Status: DC | PRN

## 2023-02-24 MED ORDER — DIPHENHYDRAMINE HCL 12.5 MG/5ML PO ELIX: 12.5000 mg | ORAL_SOLUTION | Freq: Four times a day (QID) | ORAL | Status: DC | PRN

## 2023-02-24 MED ORDER — MIDAZOLAM HCL 2 MG/2ML IJ SOLN
INTRAMUSCULAR | Status: DC | PRN
  Administered 2023-02-24: 2 mg via INTRAVENOUS

## 2023-02-24 SURGICAL SUPPLY — 49 items
ADH SKN CLS APL DERMABOND .7 (GAUZE/BANDAGES/DRESSINGS) ×1
APL PRP STRL LF DISP 70% ISPRP (MISCELLANEOUS) ×1
APL SKNCLS STERI-STRIP NONHPOA (GAUZE/BANDAGES/DRESSINGS)
BAG COUNTER SPONGE SURGICOUNT (BAG) IMPLANT
BAG SPNG CNTER NS LX DISP (BAG) ×1
BENZOIN TINCTURE PRP APPL 2/3 (GAUZE/BANDAGES/DRESSINGS) IMPLANT
BINDER ABDOMINAL 12 ML 46-62 (SOFTGOODS) IMPLANT
CABLE HIGH FREQUENCY MONO STRZ (ELECTRODE) IMPLANT
CHLORAPREP W/TINT 26 (MISCELLANEOUS) ×1 IMPLANT
COVER SURGICAL LIGHT HANDLE (MISCELLANEOUS) ×1 IMPLANT
DERMABOND ADVANCED .7 DNX12 (GAUZE/BANDAGES/DRESSINGS) IMPLANT
DEVICE SECURE STRAP 25 ABSORB (INSTRUMENTS) IMPLANT
DEVICE TROCAR PUNCTURE CLOSURE (ENDOMECHANICALS) ×1 IMPLANT
DRAIN CHANNEL 19F RND (DRAIN) IMPLANT
ELECT PENCIL ROCKER SW 15FT (MISCELLANEOUS) IMPLANT
EVACUATOR SILICONE 100CC (DRAIN) IMPLANT
GLOVE BIO SURGEON STRL SZ7.5 (GLOVE) ×1 IMPLANT
GLOVE INDICATOR 8.0 STRL GRN (GLOVE) ×1 IMPLANT
GOWN STRL REUS W/ TWL XL LVL3 (GOWN DISPOSABLE) ×3 IMPLANT
GOWN STRL REUS W/TWL XL LVL3 (GOWN DISPOSABLE) ×3
IRRIG SUCT STRYKERFLOW 2 WTIP (MISCELLANEOUS)
IRRIGATION SUCT STRKRFLW 2 WTP (MISCELLANEOUS) IMPLANT
KIT BASIN OR (CUSTOM PROCEDURE TRAY) ×1 IMPLANT
KIT TURNOVER KIT A (KITS) IMPLANT
L-HOOK LAP DISP 36CM (ELECTROSURGICAL)
LHOOK LAP DISP 36CM (ELECTROSURGICAL) IMPLANT
LIGASURE BLUNT TIP 5 LONG 44CM (ELECTROSURGICAL) IMPLANT
MARKER SKIN DUAL TIP RULER LAB (MISCELLANEOUS) ×1 IMPLANT
MESH VENTRALIGHT ST 8IN CRC (Mesh General) IMPLANT
NDL SPNL 22GX3.5 QUINCKE BK (NEEDLE) ×1 IMPLANT
NEEDLE SPNL 22GX3.5 QUINCKE BK (NEEDLE) ×1
SCISSORS LAP 5X35 DISP (ENDOMECHANICALS) IMPLANT
SET TUBE SMOKE EVAC HIGH FLOW (TUBING) ×1 IMPLANT
SHEARS HARMONIC ACE PLUS 36CM (ENDOMECHANICALS) IMPLANT
SLEEVE ADV FIXATION 5X100MM (TROCAR) IMPLANT
SPIKE FLUID TRANSFER (MISCELLANEOUS) ×1 IMPLANT
STRIP CLOSURE SKIN 1/2X4 (GAUZE/BANDAGES/DRESSINGS) IMPLANT
SUT ETHILON 2 0 PS N (SUTURE) IMPLANT
SUT MNCRL AB 4-0 PS2 18 (SUTURE) ×1 IMPLANT
SUT NOVA 1 T20/GS 25DT (SUTURE) IMPLANT
SUT VIC AB 3-0 SH 18 (SUTURE) IMPLANT
TOWEL OR 17X26 10 PK STRL BLUE (TOWEL DISPOSABLE) ×1 IMPLANT
TRAY FOLEY MTR SLVR 14FR STAT (SET/KITS/TRAYS/PACK) IMPLANT
TRAY FOLEY MTR SLVR 16FR STAT (SET/KITS/TRAYS/PACK) IMPLANT
TRAY LAPAROSCOPIC (CUSTOM PROCEDURE TRAY) ×1 IMPLANT
TROCAR ADV FIXATION 12X100MM (TROCAR) IMPLANT
TROCAR ADV FIXATION 5X100MM (TROCAR) ×1 IMPLANT
TROCAR XCEL NON-BLD 5MMX100MML (ENDOMECHANICALS) ×1 IMPLANT
TROCAR Z THREAD OPTICAL 12X100 (TROCAR) IMPLANT

## 2023-02-24 NOTE — Anesthesia Preprocedure Evaluation (Addendum)
Anesthesia Evaluation  Patient identified by MRN, date of birth, ID band Patient awake    Reviewed: Allergy & Precautions, NPO status , Patient's Chart, lab work & pertinent test results, reviewed documented beta blocker date and time   History of Anesthesia Complications Negative for: history of anesthetic complications  Airway Mallampati: III  TM Distance: >3 FB Neck ROM: Full    Dental  (+) Teeth Intact, Dental Advisory Given, Chipped   Pulmonary neg COPD   Pulmonary exam normal breath sounds clear to auscultation       Cardiovascular hypertension, Pt. on medications (-) angina (-) CAD, (-) Past MI and (-) CHF Normal cardiovascular exam Rhythm:Regular Rate:Normal     Neuro/Psych  Headaches PSYCHIATRIC DISORDERS Anxiety Depression       GI/Hepatic   Endo/Other  diabetes, Type 2, Oral Hypoglycemic Agents, Insulin Dependent  Morbid obesity  Renal/GU CRF and Renal InsufficiencyRenal disease     Musculoskeletal   Abdominal   Peds  Hematology  (+) Blood dyscrasia, anemia   Anesthesia Other Findings   Reproductive/Obstetrics                             Anesthesia Physical Anesthesia Plan  ASA: 2  Anesthesia Plan: General   Post-op Pain Management: Tylenol PO (pre-op)*   Induction: Intravenous  PONV Risk Score and Plan: 3 and Ondansetron, Midazolam and Dexamethasone  Airway Management Planned: Oral ETT  Additional Equipment:   Intra-op Plan:   Post-operative Plan: Extubation in OR  Informed Consent: I have reviewed the patients History and Physical, chart, labs and discussed the procedure including the risks, benefits and alternatives for the proposed anesthesia with the patient or authorized representative who has indicated his/her understanding and acceptance.     Dental advisory given  Plan Discussed with: CRNA  Anesthesia Plan Comments:         Anesthesia Quick  Evaluation

## 2023-02-24 NOTE — Anesthesia Procedure Notes (Signed)
Procedure Name: Intubation Date/Time: 02/24/2023 2:27 PM  Performed by: Pearson Grippe, CRNAPre-anesthesia Checklist: Patient identified, Emergency Drugs available, Suction available and Patient being monitored Patient Re-evaluated:Patient Re-evaluated prior to induction Oxygen Delivery Method: Circle system utilized Preoxygenation: Pre-oxygenation with 100% oxygen Induction Type: IV induction Ventilation: Mask ventilation without difficulty Laryngoscope Size: Miller and 2 Grade View: Grade I Tube type: Oral Tube size: 7.0 mm Number of attempts: 1 Airway Equipment and Method: Stylet Placement Confirmation: ETT inserted through vocal cords under direct vision, positive ETCO2 and breath sounds checked- equal and bilateral Secured at: 21 cm Tube secured with: Tape Dental Injury: Teeth and Oropharynx as per pre-operative assessment

## 2023-02-24 NOTE — Op Note (Signed)
Laparoscopic Repair of Incisional Ventral Hernia Repair (7 x 7 cm) Procedure Note  Indications: Symptomatic ventral LUQ incisional hernia.  The patient underwent laparoscopic cholecystectomy last year.  Extraction incision was made in the left upper quadrant due to significant thin abdominal wall muscle in the midline.  Even though it was primarily repaired with multiple sutures she developed an incisional hernia at the left upper quadrant extraction site.  At times it would bulge with eating.  CT scan demonstrated that part of the stomach was herniating into the fascial defect.  On physical exam the hernia measured 7 x 7 cm.  Pre-operative Diagnosis: Incisional left upper quadrant ventral hernia 7 x 7cm  Post-operative Diagnosis: Same  Surgeon: Gaynelle Adu MD FACS  Assistants: none  Anesthesia: General endotracheal anesthesia   Procedure Details  The patient was seen in the Holding Room. The risks, benefits, complications, treatment options, and expected outcomes were discussed with the patient. The possibilities of reaction to medication, pulmonary aspiration, perforation of viscus, bleeding, recurrent infection, the need for additional procedures, failure to diagnose a condition, and creating a complication requiring transfusion or operation were discussed with the patient. The patient concurred with the proposed plan, giving informed consent.  The site of surgery properly noted/marked. The patient was taken to the operating room, identified as Kristy Flores and the procedure verified as laparoscopic ventral hernia repair with mesh. A Time Out was held and the above information confirmed.    The patient was placed supine.  After establishing general anesthesia,   The abdomen was prepped with Chloraprep and draped in standard fashion.  Again the hernia measured 7 x 7 cm.  A 5 mm Optiview was used the cannulate the peritoneal cavity in the right upper quadrant below the costal margin.   Pneumoperitoneum was obtained by insufflating CO2, maintaining a maximum pressure of 15 mmHg.  The 5 mm 30-degree laparoscopic was inserted.  There were some omental adhesions to the anterior abdominal wall in and around the hernia defect.  An 11-mm port was placed in the right anterior axillary line at the level of the umbilicus.  Another 5-mm port was placed in the right lower quadrant.  Upper scopic LigaSure and gentle traction were used to dissect the omental adhesions away from the anterior abdominal wall.  I also took down the falciform with the LigaSure.  We cleared the entire abdominal wall and were able to visualize 1 fascial defects. We used a spinal needle to identify the extent of the hernia defects.  This covered an area of about 6 x 6 cms. adding an additional 5 cm overlap circumferentially this gave [place] a mesh requirement of around 18 cm.  We selected a round 20.3 cm piece of Bard Ventralight ST mesh.  We placed 4 stay sutures of 0 Novofil around the edges of the mesh.  The mesh was then rolled up and inserted through the 11 mm port site.  The mesh was then unrolled.  The stay sutures were then pulled up through small stab incisions using the Endo-close device.  This deployed the mesh widely over the fascial defect.  The stay sutures were then tied down.  The Secure Strap device was then used to tack down the edges of the mesh at 1 cm intervals circumferentially.  We placed a few tacks inside the outer ring of tacks.  We inspected for hemostasis.  The fascial defect at the 11 mm port site was closed with a 0 Vicryl using the Endoclose device.  Pneumoperitoneum was then released as we removed the remainder of the trocars.  The port sites were closed with 4-0 Monocryl.  All of the incisions and stay suture sites were then sealed with Dermabond.  An abdominal binder was placed around the patient's abdomen.  The patient was extubated and brought to the recovery room in stable condition.  All sponge,  instrument, and needle counts were correct prior to closure and at the conclusion of the case.   Findings: Hernia Details   Size: 3-10cm and Recurrent Hernia  Type of repair - mesh  (choices - primary suture, mesh, or component)  Name of mesh - bard ventralight ST   Size of mesh - round 20.3 cm  Mesh overlap - >5 cm  Placement of mesh - beneath fascia and into peritoneal cavity,  Estimated Blood Loss:  Minimal         Complications:  None; patient tolerated the procedure well.         Disposition: PACU - hemodynamically stable.         Condition: stable

## 2023-02-24 NOTE — Transfer of Care (Signed)
Immediate Anesthesia Transfer of Care Note  Patient: Kristy Flores  Procedure(s) Performed: LAPAROSCOPIC INCISIONAL HERNIA WITH MESH  Patient Location: PACU  Anesthesia Type:General  Level of Consciousness: awake, alert , and oriented  Airway & Oxygen Therapy: Patient Spontanous Breathing and Patient connected to face mask oxygen  Post-op Assessment: Report given to RN and Post -op Vital signs reviewed and stable  Post vital signs: Reviewed and stable  Last Vitals:  Vitals Value Taken Time  BP 142/78   Temp    Pulse 92 02/24/23 1558  Resp 21 02/24/23 1559  SpO2 94 % 02/24/23 1558  Vitals shown include unfiled device data.  Last Pain:  Vitals:   02/24/23 1256  TempSrc: Oral         Complications: No notable events documented.

## 2023-02-24 NOTE — H&P (Signed)
PROVIDER:  Sherril Cong, MD  MRN: [id number] DOB: 1956/01/10 DATE OF ENCOUNTER: 02/05/2023 Subjective  Chief Complaint: Follow-up   History of Present Illness: Kristy Flores is a 67 y.o. female who is seen today for concerns of a bulge in her upper midline.. Comes in for short follow-up after being seen in early June with complaints of a bulge in her upper abdomen. She has had a CT scan since I last saw her.  SHe underwent laparoscopic cholecystectomy with near infrared fluorescent cholangiography on April 25 2022. She had previously undergone ERCP with sphincterotomy for choledocholithiasis. Last seen 05/29/22. She had had a wound infection at her instruction site in the left upper quadrant and had a residual small knot thought to be a resolving hematoma. She canceled her appointment in December.  She states about 3 months ago she noticed a lump in her upper midline. At times it is hard. It generally bothers her after eating. Occasional nausea but no vomiting. No diarrhea or constipation.  She states the area still bothers her on a daily basis. It essentially bothers her every time she eats. She states that when she eats it will get hard and firm and bulge out. She will have nausea. She describes the discomfort as pretty intense. No fever or chills. She denies any medical changes since I last saw her last month. No blood thinners. No chest pain or chest pressure or shortness of breath. Review of Systems: A complete review of systems was obtained from the patient. I have reviewed this information and discussed as appropriate with the patient. See HPI as well for other ROS.  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  Current Outpatient Medications on File Prior to Visit Medication Sig Dispense Refill BASAGLAR KWIKPEN U-100 INSULIN pen injector (concentration 100 units/mL) Inject subcutaneously BD ULTRA-FINE SHORT PEN NEEDLE 31 gauge x 5/16" needle USE AS INSTRUCTED. INJECT INTO THE SKIN ONCE NIGHTLY. DULoxetine (CYMBALTA) 30 MG DR capsule Take 30 mg by mouth 2 (two) times daily FUROsemide (LASIX) 20 MG tablet Take 20 mg by mouth every other day lovastatin (MEVACOR) 40 MG tablet Take 40 mg by mouth at bedtime sodium bicarbonate 650 MG tablet Take 650 mg by mouth 2 (two) times daily glimepiride (AMARYL) 4 MG tablet Take by mouth oxyCODONE (ROXICODONE) 5 MG immediate release tablet Take 1 tablet (5 mg total) by mouth every 4 (four) hours as needed for Pain (Patient not taking: Reported on 12/24/2022) 5 tablet 0  No current facility-administered medications on file prior to visit.  Family History Problem Relation Age of Onset Obesity Mother High blood pressure (Hypertension) Mother Hyperlipidemia (Elevated cholesterol) Mother Diabetes Mother   Social History  Tobacco Use Smoking Status Never Smokeless Tobacco Never   Social History  Socioeconomic History Marital status: Divorced Tobacco Use Smoking status: Never Smokeless tobacco: Never Substance and Sexual Activity Alcohol use: Never Drug use: Never  Social Determinants of Health  Received from [facility], [address]  [facility] Social  [facility]  Objective:  Vitals: 02/05/23 0921 PainSc: 0-No pain PainLoc: Abdomen  There is no height or weight on file to calculate BMI.  Gen: alert, NAD, non-toxic appearing; severe obesity Pupils: equal, no scleral icterus Pulm: Lungs clear to auscultation, symmetric chest rise CV: regular rate and rhythm Abd: soft, nontender, nondistended. old trocar sites. No cellulitis. Patient has upper midline large diastases. However upon standing there is a central bulge in the middle of the upper midline diastases measuring about 8 x 8 cm. It is reducible Ext: no edema, Skin: no rash, no jaundice  Labs, Imaging and Diagnostic Testing:  Reviewed comprehensive metabolic panel from July 28, 2022 Hemoglobin A1c December 26 was 6.5  CT abdomen pelvis June 26 MPRESSION: 1. Small to medium, left paramedian, epigastric ventral hernia containing portion of unobstructed distal stomach. 2. Multiple other nonacute observations, as described above. 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 She is symptomatic from it. Even though she has severe obesity she has daily pain from her incisional hernia so I think she needs it repaired. We did discuss that she is at increased risk for recurrence given her current BMI. She states she understands but wishes to proceed.  We discussed the etiology of ventral hernias. We discussed the signs and symptoms of incarceration and strangulation. The patient was given educational material. I also  drew diagrams.  We discussed nonoperative and operative management. With respect to operative management, we discussed laparoscopic repair with mesh. We discussed the pros and cons of each approach. I discussed the typical aftercare with each procedure and how each procedure differs.  We discussed the risk and benefits of surgery including but not limited to bleeding, infection, injury to surrounding structures, hernia recurrence, mesh complications, hematoma/seroma formation, need to convert to an open procedure, blood clot formation, urinary retention, post operative ileus, general anesthesia risk, long-term abdominal pain/nerve pain. We discussed that this procedure can be quite uncomfortable and difficult to recover from based on how the mesh is secured to the abdominal wall. We discussed the importance of avoiding heavy lifting and straining for a period of 6 weeks. We discussed postoperative pain control with nonopioid analgesia. We discussed the typical recovery.  The patient has elected to proceed with laparoscopic incisional hernia repair with mesh  Moderate amount of medical decision making. Decision to proceed with surgery with known risk factors No follow-ups on file.  Mary Sella. Andrey Campanile MD FACS General, Minimally Invasive, & Bariatric Surgery Electronically signed by Gara Kroner, MD at 02/05/2023 10:08 AM EDT

## 2023-02-24 NOTE — Interval H&P Note (Signed)
History and Physical Interval Note:  02/24/2023 1:57 PM  Kristy Flores  has presented today for surgery, with the diagnosis of INCISIONAL HERNIA.  The various methods of treatment have been discussed with the patient and family. After consideration of risks, benefits and other options for treatment, the patient has consented to  Procedure(s): LAPAROSCOPIC INCISIONAL HERNIA WITH MESH (N/A) as a surgical intervention.  The patient's history has been reviewed, patient examined, no change in status, stable for surgery.  I have reviewed the patient's chart and labs.  Questions were answered to the patient's satisfaction.     Gaynelle Adu

## 2023-02-25 ENCOUNTER — Encounter (HOSPITAL_COMMUNITY): Payer: Self-pay | Admitting: General Surgery

## 2023-02-25 ENCOUNTER — Other Ambulatory Visit: Payer: Self-pay

## 2023-02-25 DIAGNOSIS — I129 Hypertensive chronic kidney disease with stage 1 through stage 4 chronic kidney disease, or unspecified chronic kidney disease: Secondary | ICD-10-CM | POA: Diagnosis not present

## 2023-02-25 DIAGNOSIS — K432 Incisional hernia without obstruction or gangrene: Secondary | ICD-10-CM | POA: Diagnosis not present

## 2023-02-25 DIAGNOSIS — N1832 Chronic kidney disease, stage 3b: Secondary | ICD-10-CM | POA: Diagnosis not present

## 2023-02-25 DIAGNOSIS — E1122 Type 2 diabetes mellitus with diabetic chronic kidney disease: Secondary | ICD-10-CM | POA: Diagnosis not present

## 2023-02-25 DIAGNOSIS — Z79899 Other long term (current) drug therapy: Secondary | ICD-10-CM | POA: Diagnosis not present

## 2023-02-25 LAB — GLUCOSE, CAPILLARY
Glucose-Capillary: 179 mg/dL — ABNORMAL HIGH (ref 70–99)
Glucose-Capillary: 200 mg/dL — ABNORMAL HIGH (ref 70–99)

## 2023-02-25 MED ORDER — OXYCODONE HCL 5 MG PO TABS
5.0000 mg | ORAL_TABLET | Freq: Four times a day (QID) | ORAL | 0 refills | Status: DC | PRN
  Filled 2023-02-25: qty 15, 4d supply, fill #0

## 2023-02-25 MED ORDER — ACETAMINOPHEN 500 MG PO TABS: 1000.0000 mg | ORAL_TABLET | Freq: Three times a day (TID) | ORAL | Status: AC

## 2023-02-25 MED ORDER — METHOCARBAMOL 750 MG PO TABS
750.0000 mg | ORAL_TABLET | Freq: Three times a day (TID) | ORAL | 0 refills | Status: AC | PRN
Start: 2023-02-25 — End: 2023-03-04
  Filled 2023-02-25: qty 20, 7d supply, fill #0

## 2023-02-25 NOTE — Progress Notes (Signed)
D/C instructions given to patient. Patient had no questions. NT will wheel patient out once her family comes in

## 2023-02-25 NOTE — Discharge Summary (Signed)
Physician Discharge Summary  Gift Reimers VHQ:[id number] DOB: 27-Dec-1955 DOA: 02/24/2023  PCP: Claiborne Rigg, NP  Admit date: 02/24/2023 Discharge date: 02/25/2023  Recommendations for Outpatient Follow-up:     Follow-up Information     Gaynelle Adu, MD. Schedule an appointment as soon as possible for a visit in 4 week(s).   Specialty: General Surgery Why: For wound re-check Contact information: [address] [place] [address] [phone]                Discharge Diagnoses:  Laparoscopic Repair of Incisional Ventral Hernia Repair (7 x 7 cm) Procedure Note  CKD DM 2 HTN Severe obesity  Surgical Procedure: Laparoscopic Repair of Incisional Ventral Hernia Repair (7 x 7 cm) Procedure Note   Discharge Condition: good Disposition: home  Diet recommendation: carb modified  Filed Weights   02/24/23 1316  Weight: 96.6 kg    History of present illness:  Pt comes in for planned lap repair of ventral incisional/recurrent hernia   Hospital Course:  Pt underwent above mentioned procedure and was kept overnight for observation and pain control.  On pod 1 she was doing well. Pain was controlled. No n/v. Tolerating a diet. Vitals stable. Ambulating ok. Felt stable for dc. Discussed dc instructions.   BP (!) 151/80 (BP Location: Left Arm)   Pulse 82   Temp 98.3 F (36.8 C) (Oral)   Resp 18   Ht 5\' 1"  (1.549 m)   Wt 96.6 kg   SpO2 94%   BMI 40.25 kg/m   Gen: alert, NAD, non-toxic appearing Pupils: equal, no scleral icterus Pulm: , symmetric chest rise CV: regular rate and rhythm Abd: soft, approp tender, nondistended. trocar sites - No cellulitis. No incisional hernia. +binder Ext: no edema, no calf tenderness Skin: no rash, no jaundice    Discharge Instructions  Discharge Instructions     Call MD for:  difficulty breathing, headache or visual disturbances   Complete by: As directed    Call MD for:  persistant nausea and vomiting    Complete by: As directed    Call MD for:  redness, tenderness, or signs of infection (pain, swelling, redness, odor or green/yellow discharge around incision site)   Complete by: As directed    Call MD for:  severe uncontrolled pain   Complete by: As directed    Call MD for:  temperature >100.4   Complete by: As directed    Diet Carb Modified   Complete by: As directed    Discharge instructions   Complete by: As directed    See [facility] discharge instructions   Increase activity slowly   Complete by: As directed    Lifting restrictions   Complete by: As directed    No heavy lifting pushing pulling anything greater than 15 lbs for 1 month      Allergies as of 02/25/2023       Reactions   Chlorzoxazone Nausea And Vomiting   Floxuridine    Hypoglycemia / pass out   Codeine Nausea And Vomiting   Gabapentin Swelling   AKI   Nsaids Other (See Comments)   CKD   Penicillins    Broke out, stomach upset.    Victoza [liraglutide]    Shut down kidneys         Medication List     TAKE these medications    acetaminophen 500 MG tablet Commonly known as: TYLENOL Take 2 tablets (1,000 mg total) by mouth every 8 (eight) hours  for 5 days. What changed:  when to take this reasons to take this   allopurinol 100 MG tablet Commonly known as: ZYLOPRIM Take 2 tablets (200 mg total) by mouth daily.   B-D ULTRAFINE III SHORT PEN 31G X 8 MM Misc Generic drug: Insulin Pen Needle USE AS INSTRUCTED. INJECT INTO THE SKIN ONCE NIGHTLY.   Basaglar KwikPen 100 UNIT/ML Inject 15 Units into the skin at bedtime.   cholecalciferol 25 MCG (1000 UNIT) tablet Commonly known as: VITAMIN D3 Take 1,000 Units by mouth daily.   DULoxetine 30 MG capsule Commonly known as: CYMBALTA Take 1 capsule (30 mg total) by mouth 2 (two) times daily. What changed:  how much to take when to take this   furosemide 20 MG tablet Commonly known as: LASIX Take 1 tablet (20 mg total) by mouth every other day.    glimepiride 4 MG tablet Commonly known as: AMARYL Take 1 tablet (4 mg total) by mouth daily before breakfast.   Lancet Device Misc use daily.   lisinopril 30 MG tablet Commonly known as: ZESTRIL Take 1 tablet (30 mg total) by mouth daily.   lovastatin 40 MG tablet Commonly known as: MEVACOR Take 1 tablet (40 mg total) by mouth at bedtime.   methocarbamol 750 MG tablet Commonly known as: Robaxin-750 Take 1 tablet (750 mg total) by mouth every 8 (eight) hours as needed for up to 7 days for muscle spasms.   mupirocin ointment 2 % Commonly known as: BACTROBAN Apply 1 Application topically 2 (two) times daily.   OneTouch Delica Plus Lancet33G Misc Use as directed daily.   OneTouch Verio Flex System w/Device Kit Use as directed daily.   OneTouch Verio test strip Generic drug: glucose blood Use as directed daily.   oxyCODONE 5 MG immediate release tablet Commonly known as: Oxy IR/ROXICODONE Take 1 tablet (5 mg total) by mouth every 6 (six) hours as needed for severe pain.   sodium bicarbonate 650 MG tablet Take 1 tablet (650 mg total) by mouth 2 (two) times daily.        Follow-up Information     Gaynelle Adu, MD. Schedule an appointment as soon as possible for a visit in 4 week(s).   Specialty: General Surgery Why: For wound re-check Contact information: [address] [place] [address] [phone]                  The results of significant diagnostics from this hospitalization (including imaging, microbiology, ancillary and laboratory) are listed below for reference.    Significant Diagnostic Studies: No results found.  Microbiology: No results found for this or any previous visit (from the past 240 hour(s)).   Labs: Basic Metabolic Panel: Recent Labs  Lab 02/25/23 0425  NA 134*  K 5.1  CL 107  CO2 19*  GLUCOSE 210*  BUN 52*  CREATININE 1.62*  CALCIUM 9.1  MG 2.1   Liver Function Tests: No results for input(s):  "AST", "ALT", "ALKPHOS", "BILITOT", "PROT", "ALBUMIN" in the last 168 hours. No results for input(s): "LIPASE", "AMYLASE" in the last 168 hours. No results for input(s): "AMMONIA" in the last 168 hours. CBC: Recent Labs  Lab 02/25/23 0425  WBC 10.3  HGB 11.2*  HCT 36.2  MCV 95.5  PLT 227   Cardiac Enzymes: No results for input(s): "CKTOTAL", "CKMB", "CKMBINDEX", "TROPONINI" in the last 168 hours. BNP: BNP (last 3 results) No results for input(s): "BNP" in the last 8760 hours.  ProBNP (last 3  results) No results for input(s): "PROBNP" in the last 8760 hours.  CBG: Recent Labs  Lab 02/24/23 1258 02/24/23 1602 02/24/23 2205 02/25/23 0756 02/25/23 1200  GLUCAP 75 135* 194* 179* 200*    Principal Problem:   S/P laparoscopic hernia repair   Time coordinating discharge: 20 minutes  Signed:  Atilano Ina, MD [facility] Surgery,  [phone] 02/25/2023, 2:04 PM

## 2023-02-25 NOTE — Anesthesia Postprocedure Evaluation (Signed)
Anesthesia Post Note  Patient: Kristy Flores  Procedure(s) Performed: LAPAROSCOPIC INCISIONAL HERNIA WITH MESH     Patient location during evaluation: PACU Anesthesia Type: General Level of consciousness: awake and alert Pain management: pain level controlled Vital Signs Assessment: post-procedure vital signs reviewed and stable Respiratory status: spontaneous breathing, nonlabored ventilation, respiratory function stable and patient connected to nasal cannula oxygen Cardiovascular status: blood pressure returned to baseline and stable Postop Assessment: no apparent nausea or vomiting Anesthetic complications: no  No notable events documented.  Last Vitals:  Vitals:   02/25/23 0222 02/25/23 0630  BP: (!) 143/71 138/66  Pulse: 86 84  Resp: 18 18  Temp: 36.6 C 36.5 C  SpO2: 97% 99%    Last Pain:  Vitals:   02/25/23 0804  TempSrc:   PainSc: 3                   L 

## 2023-02-25 NOTE — Discharge Instructions (Signed)
LAPAROSCOPIC SURGERY: POST OP INSTRUCTIONS Always review your discharge instruction sheet given to you by the facility where your surgery was performed. IF YOU HAVE DISABILITY OR FAMILY LEAVE FORMS, YOU MUST BRING THEM TO THE OFFICE FOR PROCESSING.   DO NOT GIVE THEM TO YOUR DOCTOR.  PAIN CONTROL  First take acetaminophen (Tylenol) AND/or ibuprofen (Advil) to control your pain after surgery.  Follow directions on package.  Taking acetaminophen (Tylenol) and/or ibuprofen (Advil) regularly after surgery will help to control your pain and lower the amount of prescription pain medication you may need.  You should not take more than 3,000 mg (3 grams) of acetaminophen (Tylenol) in 24 hours.  You should not take ibuprofen (Advil), aleve, motrin, naprosyn or other NSAIDS if you have a history of stomach ulcers or chronic kidney disease.  A prescription for pain medication may be given to you upon discharge.  Take your pain medication as prescribed, if you still have uncontrolled pain after taking acetaminophen (Tylenol) or ibuprofen (Advil). Use ice packs to help control pain. If you need a refill on your pain medication, please contact your pharmacy.  They will contact our office to request authorization. Prescriptions will not be filled after 5pm or on week-ends.  HOME MEDICATIONS Take your usually prescribed medications unless otherwise directed.  DIET You should follow a light diet the first few days after arrival home.  Be sure to include lots of fluids daily. Avoid fatty, fried foods.   CONSTIPATION It is common to experience some constipation after surgery and if you are taking pain medication.  Increasing fluid intake and taking a stool softener (such as Colace) will usually help or prevent this problem from occurring.  A mild laxative (Milk of Magnesia or Miralax) should be taken according to package instructions if there are no bowel movements after 48 hours.  WOUND/INCISION CARE Most  patients will experience some swelling and bruising in the area of the incisions.  Ice packs will help.  Swelling and bruising can take several days to resolve.  Unless discharge instructions indicate otherwise, follow guidelines below  STERI-STRIPS - you may remove your outer bandages 48 hours after surgery, and you may shower at that time.  You have steri-strips (small skin tapes) in place directly over the incision.  These strips should be left on the skin for 7-10 days.   DERMABOND/SKIN GLUE - you may shower in 24 hours.  The glue will flake off over the next 2-3 weeks. Any sutures or staples will be removed at the office during your follow-up visit.  ACTIVITIES You may resume regular (light) daily activities beginning the next day--such as daily self-care, walking, climbing stairs--gradually increasing activities as tolerated.  You may have sexual intercourse when it is comfortable.  Refrain from any heavy lifting or straining until approved by your doctor. You may drive when you are no longer taking prescription pain medication, you can comfortably wear a seatbelt, and you can safely maneuver your car and apply brakes.  FOLLOW-UP You should see your doctor in the office for a follow-up appointment approximately 2-3 weeks after your surgery.  You should have been given your post-op/follow-up appointment when your surgery was scheduled.  If you did not receive a post-op/follow-up appointment, make sure that you call for this appointment within a day or two after you arrive home to insure a convenient appointment time.  OTHER INSTRUCTIONS Wear abdominal binder   WHEN TO CALL YOUR DOCTOR: Fever over 101.0 Inability to urinate Continued bleeding from  incision. Increased pain, redness, or drainage from the incision. Increasing abdominal pain  The clinic staff is available to answer your questions during regular business hours.  Please don't hesitate to call and ask to speak to one of the  nurses for clinical concerns.  If you have a medical emergency, go to the nearest emergency room or call 911.  A surgeon from [facility] is always on call at the hospital. [address], [address], [place], [place]  [address] ? [address] [address], [place], [place]   [address] [phone] ? [phone] ? FAX [phone] Web site: [email or web address]

## 2023-02-25 NOTE — Progress Notes (Signed)
   02/25/23 1039  TOC Brief Assessment  Insurance and Status Reviewed  Patient has primary care physician Yes  Home environment has been reviewed Resides with children  Prior level of function: Independent at baseline  Prior/Current Home Services No current home services  Social Determinants of Health Reivew SDOH reviewed no interventions necessary  Readmission risk has been reviewed Yes  Transition of care needs no transition of care needs at this time

## 2023-02-26 ENCOUNTER — Telehealth: Payer: Self-pay

## 2023-02-26 NOTE — Transitions of Care (Post Inpatient/ED Visit) (Signed)
   02/26/2023  Name: Kristy Flores MRN: [id number] DOB: [birth year]  Today's TOC FU Call Status: Today's TOC FU Call Status:: Successful TOC FU Call Completed TOC FU Call Complete Date: 02/26/23  Transition Care Management Follow-up Telephone Call Date of Discharge: 02/25/23 Discharge Facility: [facility]) Type of Discharge: Inpatient Admission Primary Inpatient Discharge Diagnosis:: s/p lap hernia repair How have you been since you were released from the hospital?: Better (she said she is just sore) Any questions or concerns?: No  Items Reviewed: Did you receive and understand the discharge instructions provided?: Yes Medications obtained,verified, and reconciled?: Partial Review Completed Reason for Partial Mediation Review: She said she has all medications as well as a glucometer and did not have any questions about the med regime and did not need to review it. Any new allergies since your discharge?: No Dietary orders reviewed?: Yes Type of Diet Ordered:: heart healthy diabetic Do you have support at home?: Yes Name of Support/Comfort Primary Source: support from family  Medications Reviewed Today: Medications Reviewed Today   Medications were not reviewed in this encounter     Home Care and Equipment/Supplies: Were Home Health Services Ordered?: No Any new equipment or medical supplies ordered?: No  Functional Questionnaire: Do you need assistance with bathing/showering or dressing?: No Do you need assistance with meal preparation?: No Do you need assistance with eating?: No Do you have difficulty maintaining continence: No Do you need assistance with getting out of bed/getting out of a chair/moving?: No Do you have difficulty managing or taking your medications?: No  Follow up appointments reviewed: PCP Follow-up appointment confirmed?: Yes Date of PCP follow-up appointment?: 04/14/23 Follow-up Provider: Bertram Denver, NP Specialist Hospital Follow-up  appointment confirmed?: No Reason Specialist Follow-Up Not Confirmed: Patient has Specialist Provider Number and will Call for Appointment (She stated she understands she needs to call to schedule the follow up) Do you need transportation to your follow-up appointment?: No Do you understand care options if your condition(s) worsen?: Yes-patient verbalized understanding    SIGNATURE Robyne Peers, RN

## 2023-03-04 DIAGNOSIS — E119 Type 2 diabetes mellitus without complications: Secondary | ICD-10-CM | POA: Diagnosis not present

## 2023-03-04 DIAGNOSIS — H25812 Combined forms of age-related cataract, left eye: Secondary | ICD-10-CM | POA: Diagnosis not present

## 2023-03-16 ENCOUNTER — Other Ambulatory Visit: Payer: Self-pay

## 2023-03-25 ENCOUNTER — Other Ambulatory Visit: Payer: Self-pay

## 2023-03-30 ENCOUNTER — Other Ambulatory Visit: Payer: Self-pay

## 2023-04-14 ENCOUNTER — Ambulatory Visit: Payer: Medicare HMO | Attending: Nurse Practitioner | Admitting: Nurse Practitioner

## 2023-04-14 ENCOUNTER — Other Ambulatory Visit: Payer: Self-pay

## 2023-04-14 ENCOUNTER — Encounter: Payer: Self-pay | Admitting: Nurse Practitioner

## 2023-04-14 VITALS — BP 115/69 | HR 75 | Ht 61.0 in | Wt 218.8 lb

## 2023-04-14 DIAGNOSIS — E1122 Type 2 diabetes mellitus with diabetic chronic kidney disease: Secondary | ICD-10-CM | POA: Diagnosis not present

## 2023-04-14 DIAGNOSIS — Z794 Long term (current) use of insulin: Secondary | ICD-10-CM

## 2023-04-14 DIAGNOSIS — M1A09X0 Idiopathic chronic gout, multiple sites, without tophus (tophi): Secondary | ICD-10-CM

## 2023-04-14 DIAGNOSIS — E785 Hyperlipidemia, unspecified: Secondary | ICD-10-CM | POA: Diagnosis not present

## 2023-04-14 DIAGNOSIS — Z23 Encounter for immunization: Secondary | ICD-10-CM | POA: Diagnosis not present

## 2023-04-14 DIAGNOSIS — Z124 Encounter for screening for malignant neoplasm of cervix: Secondary | ICD-10-CM | POA: Diagnosis not present

## 2023-04-14 DIAGNOSIS — I10 Essential (primary) hypertension: Secondary | ICD-10-CM

## 2023-04-14 DIAGNOSIS — E1165 Type 2 diabetes mellitus with hyperglycemia: Secondary | ICD-10-CM | POA: Diagnosis not present

## 2023-04-14 DIAGNOSIS — N183 Chronic kidney disease, stage 3 unspecified: Secondary | ICD-10-CM | POA: Diagnosis not present

## 2023-04-14 DIAGNOSIS — G894 Chronic pain syndrome: Secondary | ICD-10-CM

## 2023-04-14 DIAGNOSIS — L301 Dyshidrosis [pompholyx]: Secondary | ICD-10-CM

## 2023-04-14 MED ORDER — GLIMEPIRIDE 4 MG PO TABS
4.0000 mg | ORAL_TABLET | Freq: Every day | ORAL | 1 refills | Status: DC
Start: 2023-04-14 — End: 2023-08-18
  Filled 2023-04-14 – 2023-05-06 (×2): qty 90, 90d supply, fill #0
  Filled 2023-08-03: qty 90, 90d supply, fill #1

## 2023-04-14 MED ORDER — BD PEN NEEDLE SHORT U/F 31G X 8 MM MISC
0 refills | Status: DC
Start: 2023-04-14 — End: 2023-07-27
  Filled 2023-04-14: qty 100, 100d supply, fill #0

## 2023-04-14 MED ORDER — TRIAMCINOLONE ACETONIDE 0.025 % EX OINT
1.0000 | TOPICAL_OINTMENT | Freq: Two times a day (BID) | CUTANEOUS | 0 refills | Status: DC
Start: 2023-04-14 — End: 2023-08-18
  Filled 2023-04-14: qty 60, 30d supply, fill #0

## 2023-04-14 MED ORDER — ALLOPURINOL 100 MG PO TABS
200.0000 mg | ORAL_TABLET | Freq: Every day | ORAL | 1 refills | Status: DC
  Filled 2023-04-14: qty 180, 90d supply, fill #0
  Filled 2023-07-20: qty 180, 90d supply, fill #1

## 2023-04-14 MED ORDER — LISINOPRIL 30 MG PO TABS
30.0000 mg | ORAL_TABLET | Freq: Every day | ORAL | 1 refills | Status: DC
Start: 2023-04-14 — End: 2023-07-21
  Filled 2023-04-14 – 2023-04-27 (×3): qty 90, 90d supply, fill #0
  Filled 2023-07-21: qty 90, 90d supply, fill #1

## 2023-04-14 MED ORDER — DULOXETINE HCL 30 MG PO CPEP
30.0000 mg | ORAL_CAPSULE | Freq: Two times a day (BID) | ORAL | 1 refills | Status: DC
Start: 2023-04-14 — End: 2023-08-18
  Filled 2023-04-14 – 2023-04-27 (×3): qty 180, 90d supply, fill #0
  Filled 2023-07-21: qty 180, 90d supply, fill #1

## 2023-04-14 MED ORDER — BLOOD GLUCOSE TEST VI STRP
1.0000 | ORAL_STRIP | Freq: Every day | 1 refills | Status: AC
  Filled 2023-04-14: qty 100, 100d supply, fill #0
  Filled 2023-07-24: qty 100, 100d supply, fill #1

## 2023-04-14 MED ORDER — LOVASTATIN 40 MG PO TABS
40.0000 mg | ORAL_TABLET | Freq: Every day | ORAL | 1 refills | Status: DC
Start: 2023-04-14 — End: 2023-08-18
  Filled 2023-04-14: qty 90, 90d supply, fill #0
  Filled 2023-07-20: qty 90, 90d supply, fill #1

## 2023-04-14 MED ORDER — BASAGLAR KWIKPEN 100 UNIT/ML ~~LOC~~ SOPN
15.0000 [IU] | PEN_INJECTOR | Freq: Every day | SUBCUTANEOUS | 3 refills | Status: DC
Start: 2023-04-14 — End: 2023-11-16
  Filled 2023-04-14: qty 15, 100d supply, fill #0
  Filled 2023-07-31: qty 15, 100d supply, fill #1
  Filled 2023-11-16: qty 15, 100d supply, fill #2

## 2023-04-14 MED ORDER — ONETOUCH DELICA PLUS LANCET33G MISC
1.0000 | Freq: Every day | 1 refills | Status: DC
  Filled 2023-04-14: qty 100, 100d supply, fill #0
  Filled 2023-11-05: qty 100, 100d supply, fill #1

## 2023-04-14 MED ORDER — FUROSEMIDE 20 MG PO TABS
20.0000 mg | ORAL_TABLET | ORAL | 1 refills | Status: DC
Start: 2023-04-14 — End: 2023-08-18
  Filled 2023-04-14 – 2023-05-18 (×2): qty 45, 90d supply, fill #0
  Filled 2023-08-10: qty 45, 90d supply, fill #1

## 2023-04-14 NOTE — Progress Notes (Signed)
Assessment & Plan:  Salma was seen today for medical management of chronic issues.  Diagnoses and all orders for this visit:  Primary hypertension -     lisinopril (ZESTRIL) 30 MG tablet; Take 1 tablet (30 mg total) by mouth daily. Continue all antihypertensives as prescribed.  Reminded to bring in blood pressure log for follow  up appointment.  RECOMMENDATIONS: DASH/Mediterranean Diets are healthier choices for HTN.    CKD stage 3 secondary to diabetes  She is currently being followed -     Urine Albumin/Creatinine with ratio (send out) [LAB689] -     furosemide (LASIX) 20 MG tablet; Take 1 tablet (20 mg total) by mouth every other day.  Controlled type 2 diabetes mellitus with hyperglycemia, with long-term current use of insulin -     lovastatin (MEVACOR) 40 MG tablet; Take 1 tablet (40 mg total) by mouth at bedtime. -     glimepiride (AMARYL) 4 MG tablet; Take 1 tablet (4 mg total) by mouth daily before breakfast. -     Glucose Blood (BLOOD GLUCOSE TEST STRIPS) STRP; Use as directed daily. -     Insulin Glargine (BASAGLAR KWIKPEN) 100 UNIT/ML; Inject 15 Units into the skin at bedtime. -     Insulin Pen Needle (B-D ULTRAFINE III SHORT PEN) 31G X 8 MM MISC; USE AS INSTRUCTED. INJECT INTO THE SKIN ONCE NIGHTLY. -     Lancets (ONETOUCH DELICA PLUS LANCET33G) MISC; Use as directed daily.  Dyslipidemia, goal LDL below 70 -     lovastatin (MEVACOR) 40 MG tablet; Take 1 tablet (40 mg total) by mouth at bedtime.  Chronic pain syndrome Well controlled> Also helping with depression -     DULoxetine (CYMBALTA) 30 MG capsule; Take 1 capsule (30 mg total) by mouth 2 (two) times daily.  Encounter for immunization -     Flu Vaccine Trivalent High Dose (Fluad)  Dyshidrotic eczema -     triamcinolone (KENALOG) 0.025 % ointment; Apply 1 Application topically 2 (two) times daily. Apply to rash on hand  Chronic gout of multiple sites, unspecified cause -     allopurinol (ZYLOPRIM) 100 MG  tablet; Take 2 tablets (200 mg total) by mouth daily.    Patient has been counseled on age-appropriate routine health concerns for screening and prevention. These are reviewed and up-to-date. Referrals have been placed accordingly. Immunizations are up-to-date or declined.    Subjective:   Chief Complaint  Patient presents with   Medical Management of Chronic Issues   HPI Kristy Flores 67 y.o. female presents to office today for rash on right hand and HTN.   Rash started a few weeks ago. Appearance of rash at onset: Color of lesion(s): clear, Texture of lesion(s): flat. Rash has not changed over time.  Discomfort associated with rash: is pruritic.  Associated symptoms: none. Denies: fever. Patient has not had previous evaluation of rash. Patient has not had previous treatment. Patient has not had contacts with similar rash. Patient has not identified precipitant. Patient has not had new exposures (soaps, lotions, laundry detergents, foods, medications, plants, insects or animals.)    HTN Blood pressure is well controlled. Taking lisinopril 30 mg daily.  BP Readings from Last 3 Encounters:  04/14/23 115/69  02/25/23 (!) 151/80  02/11/23 (!) 149/66      Review of Systems  Constitutional:  Negative for fever, malaise/fatigue and weight loss.  HENT: Negative.  Negative for nosebleeds.   Eyes: Negative.  Negative for blurred vision, double vision  and photophobia.  Respiratory: Negative.  Negative for cough and shortness of breath.   Cardiovascular: Negative.  Negative for chest pain, palpitations and leg swelling.  Gastrointestinal: Negative.  Negative for heartburn, nausea and vomiting.  Musculoskeletal: Negative.  Negative for myalgias.  Skin:  Positive for itching and rash.  Neurological: Negative.  Negative for dizziness, focal weakness, seizures and headaches.  Psychiatric/Behavioral: Negative.  Negative for suicidal ideas.     Past Medical History:  Diagnosis Date    Anemia    Anxiety    Chronic kidney disease    stage III   Depression    Diabetes mellitus without complication (HCC)    Headache    Hyperlipidemia    Hypertension    Insomnia    Melanoma (HCC)    Back   Pneumonia    Restless leg syndrome     Past Surgical History:  Procedure Laterality Date   ABDOMINAL HYSTERECTOMY     CESAREAN SECTION     CHOLECYSTECTOMY N/A 04/25/2022   Procedure: LAPAROSCOPIC CHOLECYSTECTOMY WITH ICG DYE;  Surgeon: Gaynelle Adu, MD;  Location: [facility];  Service: General;  Laterality: N/A;   ERCP N/A 02/13/2022   Procedure: ENDOSCOPIC RETROGRADE CHOLANGIOPANCREATOGRAPHY (ERCP);  Surgeon: Rachael Fee, MD;  Location: [facility];  Service: Gastroenterology;  Laterality: N/A;   INCISIONAL HERNIA REPAIR N/A 02/24/2023   Procedure: LAPAROSCOPIC INCISIONAL HERNIA WITH MESH;  Surgeon: Gaynelle Adu, MD;  Location: [facility];  Service: General;  Laterality: N/A;   MELANOMA EXCISION  1985   REMOVAL OF STONES  02/13/2022   Procedure: REMOVAL OF STONES;  Surgeon: Rachael Fee, MD;  Location: [facility];  Service: Gastroenterology;;   Dennison Mascot  02/13/2022   Procedure: Dennison Mascot;  Surgeon: Rachael Fee, MD;  Location: [facility];  Service: Gastroenterology;;   UPPER GI ENDOSCOPY      Family History  Problem Relation Age of Onset   Kidney disease Mother    Diabetes Mother    Hypertension Mother    Cancer Father     Social History Reviewed with no changes to be made today.   Outpatient Medications Prior to Visit  Medication Sig Dispense Refill   Blood Glucose Monitoring Suppl (ONETOUCH VERIO FLEX SYSTEM) w/Device KIT Use as directed daily. 1 kit 0   cholecalciferol (VITAMIN D3) 25 MCG (1000 UNIT) tablet Take 1,000 Units by mouth daily.     Lancet Device MISC use daily. 1 each 0   mupirocin ointment (BACTROBAN) 2 % Apply 1 Application topically 2 (two) times daily. 22 g 1   sodium bicarbonate 650 MG tablet Take 1 tablet (650 mg total) by  mouth 2 (two) times daily. 60 tablet 11   allopurinol (ZYLOPRIM) 100 MG tablet Take 2 tablets (200 mg total) by mouth daily. 180 tablet 1   DULoxetine (CYMBALTA) 30 MG capsule Take 1 capsule (30 mg total) by mouth 2 (two) times daily. (Patient taking differently: Take 60 mg by mouth daily.) 180 capsule 1   furosemide (LASIX) 20 MG tablet Take 1 tablet (20 mg total) by mouth every other day. 45 tablet 1   glimepiride (AMARYL) 4 MG tablet Take 1 tablet (4 mg total) by mouth daily before breakfast. 90 tablet 1   Glucose Blood (BLOOD GLUCOSE TEST STRIPS) STRP Use as directed daily. 100 strip 1   Insulin Glargine (BASAGLAR KWIKPEN) 100 UNIT/ML Inject 15 Units into the skin at bedtime. 15 mL 3   Insulin Pen Needle (B-D ULTRAFINE III SHORT PEN) 31G X 8  MM MISC USE AS INSTRUCTED. INJECT INTO THE SKIN ONCE NIGHTLY. 100 each 0   Lancets (ONETOUCH DELICA PLUS LANCET33G) MISC Use as directed daily. 100 each 1   lisinopril (ZESTRIL) 30 MG tablet Take 1 tablet (30 mg total) by mouth daily. 90 tablet 1   lovastatin (MEVACOR) 40 MG tablet Take 1 tablet (40 mg total) by mouth at bedtime. 90 tablet 1   oxyCODONE (OXY IR/ROXICODONE) 5 MG immediate release tablet Take 1 tablet (5 mg total) by mouth every 6 (six) hours as needed for severe pain. (Patient not taking: Reported on 04/14/2023) 15 tablet 0   No facility-administered medications prior to visit.    Allergies  Allergen Reactions   Chlorzoxazone Nausea And Vomiting   Floxuridine     Hypoglycemia / pass out   Codeine Nausea And Vomiting   Gabapentin Swelling    AKI   Nsaids Other (See Comments)    CKD   Penicillins     Broke out, stomach upset.    Victoza [Liraglutide]     Shut down kidneys        Objective:    BP 115/69 (BP Location: Left Arm, Patient Position: Sitting, Cuff Size: Normal)   Pulse 75   Ht 5\' 1"  (1.549 m)   Wt 218 lb 12.8 oz (99.2 kg)   SpO2 100%   BMI 41.34 kg/m  Wt Readings from Last 3 Encounters:  04/14/23 218 lb 12.8  oz (99.2 kg)  02/24/23 213 lb (96.6 kg)  02/11/23 213 lb (96.6 kg)    Physical Exam Vitals and nursing note reviewed.  Constitutional:      Appearance: She is well-developed.  HENT:     Head: Normocephalic and atraumatic.  Cardiovascular:     Rate and Rhythm: Normal rate and regular rhythm.     Heart sounds: Normal heart sounds. No murmur heard.    No friction rub. No gallop.  Pulmonary:     Effort: Pulmonary effort is normal. No tachypnea or respiratory distress.     Breath sounds: Normal breath sounds. No decreased breath sounds, wheezing, rhonchi or rales.  Chest:     Chest wall: No tenderness.  Abdominal:     General: Bowel sounds are normal.     Palpations: Abdomen is soft.  Musculoskeletal:        General: Normal range of motion.     Cervical back: Normal range of motion.  Skin:    General: Skin is warm and dry.     Findings: Rash present. Rash is macular.     Comments: See photo  Neurological:     Mental Status: She is alert and oriented to person, place, and time.     Coordination: Coordination normal.  Psychiatric:        Behavior: Behavior normal. Behavior is cooperative.        Thought Content: Thought content normal.        Judgment: Judgment normal.          Patient has been counseled extensively about nutrition and exercise as well as the importance of adherence with medications and regular follow-up. The patient was given clear instructions to go to ER or return to medical center if symptoms don't improve, worsen or new problems develop. The patient verbalized understanding.   Follow-up: Return in about 3 months (around 07/14/2023).   Claiborne Rigg, FNP-BC [facility] [place], [place] [phone]   04/14/2023, 12:28 PM

## 2023-04-15 LAB — MICROALBUMIN / CREATININE URINE RATIO
Creatinine, Urine: 47.5 mg/dL
Microalb/Creat Ratio: 260 mg/g creat — ABNORMAL HIGH (ref 0–29)
Microalbumin, Urine: 123.3 ug/mL

## 2023-04-27 ENCOUNTER — Other Ambulatory Visit (HOSPITAL_BASED_OUTPATIENT_CLINIC_OR_DEPARTMENT_OTHER): Payer: Self-pay

## 2023-04-27 ENCOUNTER — Other Ambulatory Visit: Payer: Self-pay

## 2023-04-28 ENCOUNTER — Other Ambulatory Visit: Payer: Self-pay

## 2023-05-06 ENCOUNTER — Other Ambulatory Visit: Payer: Self-pay

## 2023-05-07 ENCOUNTER — Other Ambulatory Visit: Payer: Self-pay

## 2023-05-18 ENCOUNTER — Other Ambulatory Visit: Payer: Self-pay

## 2023-05-19 ENCOUNTER — Other Ambulatory Visit: Payer: Self-pay

## 2023-06-04 ENCOUNTER — Other Ambulatory Visit: Payer: Self-pay

## 2023-06-05 ENCOUNTER — Other Ambulatory Visit: Payer: Self-pay

## 2023-06-05 DIAGNOSIS — I129 Hypertensive chronic kidney disease with stage 1 through stage 4 chronic kidney disease, or unspecified chronic kidney disease: Secondary | ICD-10-CM | POA: Diagnosis not present

## 2023-06-05 DIAGNOSIS — N2581 Secondary hyperparathyroidism of renal origin: Secondary | ICD-10-CM | POA: Diagnosis not present

## 2023-06-05 DIAGNOSIS — N179 Acute kidney failure, unspecified: Secondary | ICD-10-CM | POA: Diagnosis not present

## 2023-06-05 DIAGNOSIS — E1122 Type 2 diabetes mellitus with diabetic chronic kidney disease: Secondary | ICD-10-CM | POA: Diagnosis not present

## 2023-06-05 DIAGNOSIS — R809 Proteinuria, unspecified: Secondary | ICD-10-CM | POA: Diagnosis not present

## 2023-06-05 DIAGNOSIS — N1832 Chronic kidney disease, stage 3b: Secondary | ICD-10-CM | POA: Diagnosis not present

## 2023-06-05 DIAGNOSIS — E872 Acidosis, unspecified: Secondary | ICD-10-CM | POA: Diagnosis not present

## 2023-06-05 DIAGNOSIS — E875 Hyperkalemia: Secondary | ICD-10-CM | POA: Diagnosis not present

## 2023-06-06 LAB — LAB REPORT - SCANNED
Creatinine, POC: 75.8 mg/dL
EGFR: 28

## 2023-06-12 ENCOUNTER — Other Ambulatory Visit: Payer: Self-pay

## 2023-06-12 DIAGNOSIS — S51001A Unspecified open wound of right elbow, initial encounter: Secondary | ICD-10-CM | POA: Diagnosis not present

## 2023-06-12 DIAGNOSIS — L089 Local infection of the skin and subcutaneous tissue, unspecified: Secondary | ICD-10-CM | POA: Diagnosis not present

## 2023-06-12 DIAGNOSIS — M25521 Pain in right elbow: Secondary | ICD-10-CM | POA: Diagnosis not present

## 2023-06-12 DIAGNOSIS — X58XXXA Exposure to other specified factors, initial encounter: Secondary | ICD-10-CM | POA: Diagnosis not present

## 2023-06-12 MED ORDER — TRAMADOL HCL 50 MG PO TABS
50.0000 mg | ORAL_TABLET | Freq: Four times a day (QID) | ORAL | 0 refills | Status: DC
  Filled 2023-06-12: qty 28, 7d supply, fill #0

## 2023-06-12 MED ORDER — CLINDAMYCIN HCL 300 MG PO CAPS
300.0000 mg | ORAL_CAPSULE | Freq: Three times a day (TID) | ORAL | 0 refills | Status: AC
  Filled 2023-06-12: qty 30, 10d supply, fill #0

## 2023-06-16 ENCOUNTER — Encounter: Payer: Self-pay | Admitting: Nurse Practitioner

## 2023-06-30 ENCOUNTER — Other Ambulatory Visit: Payer: Self-pay

## 2023-06-30 ENCOUNTER — Other Ambulatory Visit: Payer: Self-pay | Admitting: Family Medicine

## 2023-06-30 DIAGNOSIS — B9689 Other specified bacterial agents as the cause of diseases classified elsewhere: Secondary | ICD-10-CM

## 2023-06-30 MED ORDER — MUPIROCIN 2 % EX OINT
1.0000 | TOPICAL_OINTMENT | Freq: Two times a day (BID) | CUTANEOUS | 0 refills | Status: DC
  Filled 2023-06-30: qty 22, 11d supply, fill #0

## 2023-07-01 ENCOUNTER — Other Ambulatory Visit: Payer: Self-pay

## 2023-07-15 IMAGING — MR MR ABDOMEN WO/W CM
17 of 18 series · 44 of 48 positions shown · IV contrast (gadavist)
Comparison: Ultrasound October 31, 2021

CLINICAL DATA: Possible bilateral solid renal lesions.

EXAM:
MRI ABDOMEN WITHOUT AND WITH CONTRAST
TECHNIQUE: Multiplanar multisequence MR imaging of the abdomen was performed
both before and after the administration of intravenous contrast.
CONTRAST:  10mL GADAVIST GADOBUTROL 1 MMOL/ML IV SOLN

[Series 3: cor ssfse / · coronal · 7.0mm · 1.48mm/px · 1 of 36 slices shown]
[im 1/36]
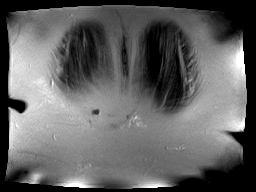

[Series 4: T2 fat-sat · axial · 7.0mm · 1.48mm/px · 1 of 28 slices shown]
[im 1/28]
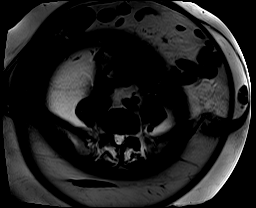

[Series 5: T1 · axial · 7.0mm · 0.74mm/px · z∈[-82,+128]mm · 2 of 52 slices shown]
[im 1/52]
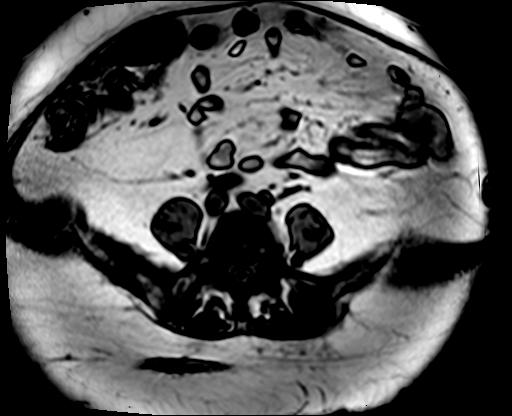
[im 52/52]
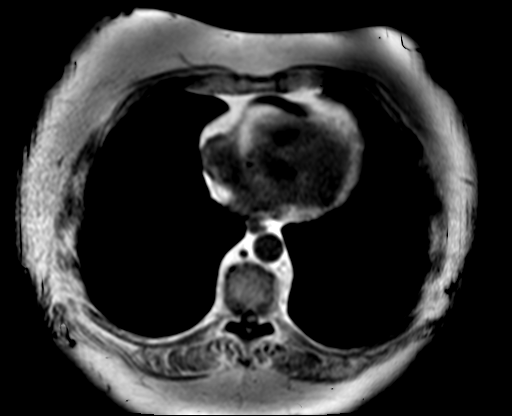

[Series 6: DWI · axial · 7.0mm · 2.08mm/px · z∈[-69,+175]mm · 5 of 90 slices shown (1 of 2)]
[im 1/90]
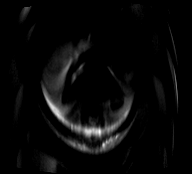
[im 23/90]
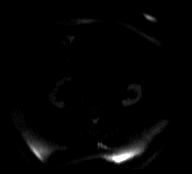
[im 45/90]
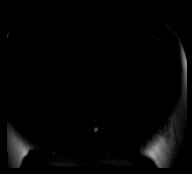
[im 67/90]
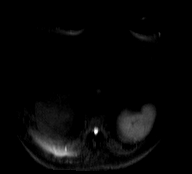
[im 90/90]
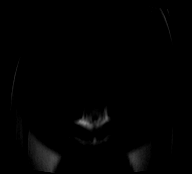

[Series 7: DWI · axial · 7.0mm · 2.08mm/px · z∈[-69,+175]mm · 2 of 30 slices shown (2 of 2)]
[im 1/30]
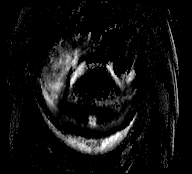
[im 30/30]
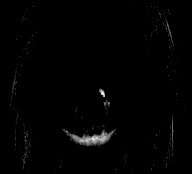

[Series 8: axial ssfse / · axial · 7.0mm · 1.19mm/px · 1 of 28 slices shown]
[im 1/28]
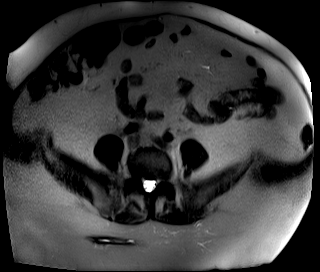

[Series 9: bSSFP · axial · 7.0mm · 0.74mm/px · 1 of 28 slices shown]
[im 1/28]
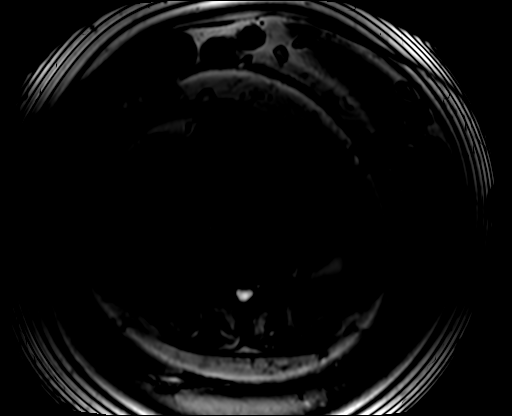

[Series 11: T2 · coronal · 1.5mm · 0.99mm/px · 4 of 72 slices shown]
[im 1/72]
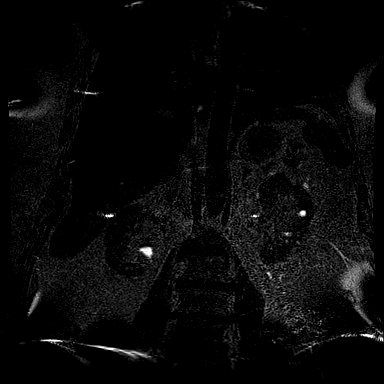
[im 24/72]
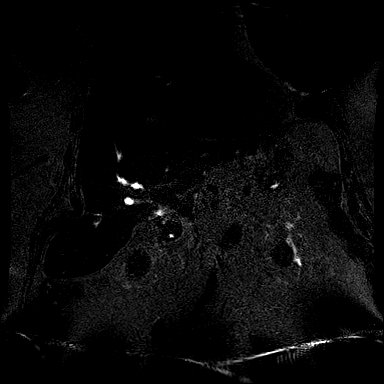
[im 48/72]
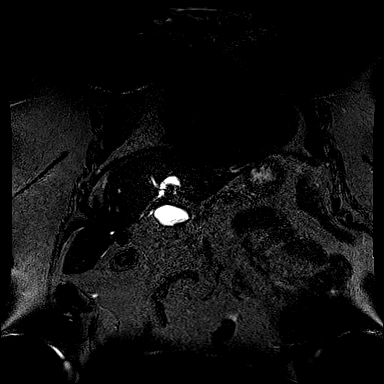
[im 72/72]
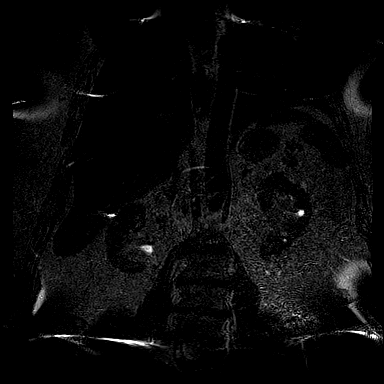

[Series 13: axial dynamic pre · axial · non-contrast · 4.0mm · 1.32mm/px · z∈[-85,+135]mm · 3 of 56 slices shown]
[im 1/56]
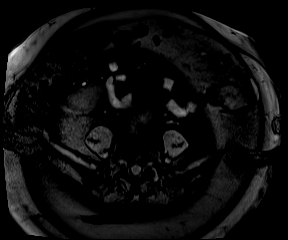
[im 28/56]
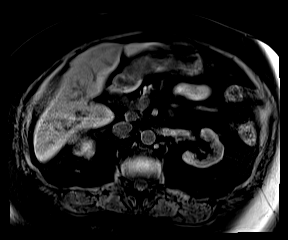
[im 56/56]
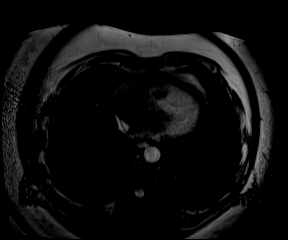

[Series 14: axial dynamic post · axial · 4.0mm · 1.32mm/px · z∈[-85,+135]mm · 3 of 56 slices shown (1 of 6)]
[im 1/56]
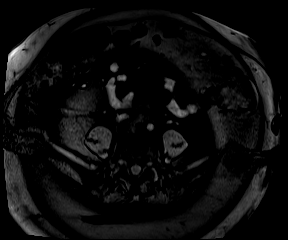
[im 28/56]
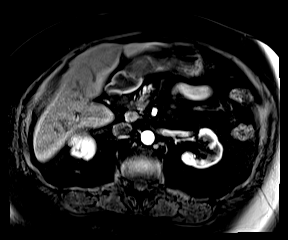
[im 56/56]
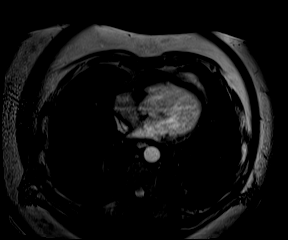

[Series 15: axial dynamic post · axial · 4.0mm · 1.32mm/px · z∈[-85,+135]mm · 3 of 56 slices shown (2 of 6)]
[im 1/56]
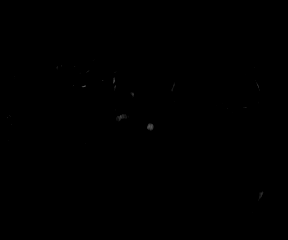
[im 28/56]
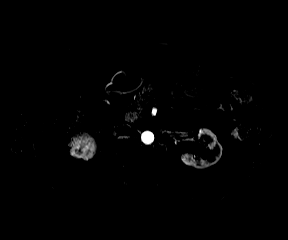
[im 56/56]
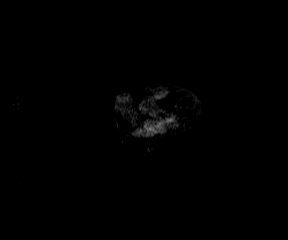

[Series 16: axial dynamic post · axial · 4.0mm · 1.32mm/px · z∈[-85,+135]mm · 3 of 56 slices shown (3 of 6)]
[im 1/56]
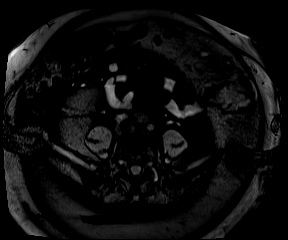
[im 28/56]
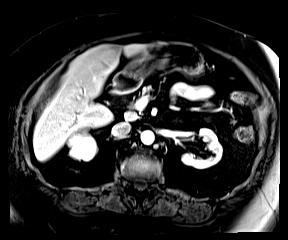
[im 56/56]
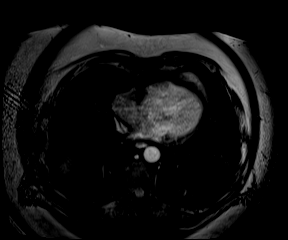

[Series 17: axial dynamic post · axial · 4.0mm · 1.32mm/px · z∈[-85,+135]mm · 3 of 56 slices shown (4 of 6)]
[im 1/56]
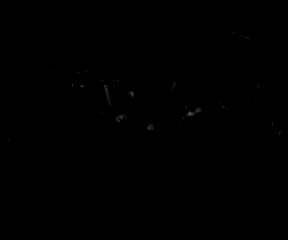
[im 28/56]
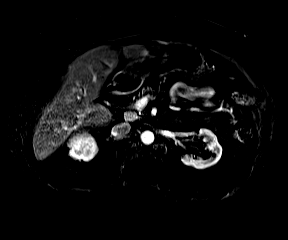
[im 56/56]
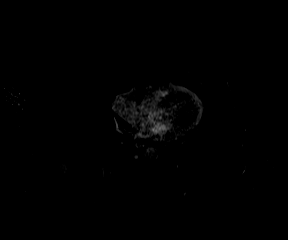

[Series 18: axial dynamic post · axial · 4.0mm · 1.32mm/px · z∈[-85,+135]mm · 3 of 56 slices shown (5 of 6)]
[im 1/56]
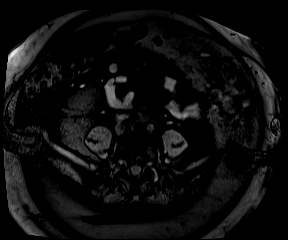
[im 28/56]
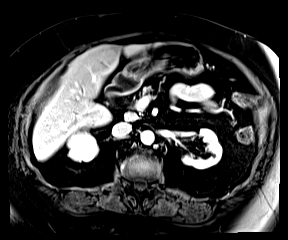
[im 56/56]
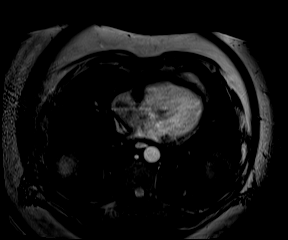

[Series 19: axial dynamic post · axial · 4.0mm · 1.32mm/px · z∈[-85,+135]mm · 3 of 56 slices shown (6 of 6)]
[im 1/56]
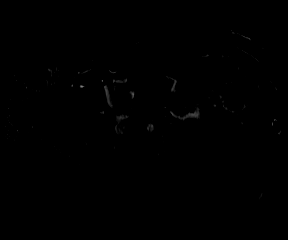
[im 28/56]
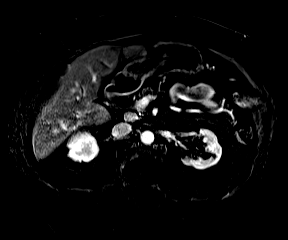
[im 56/56]
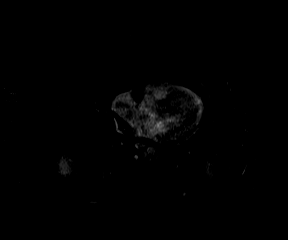

[Series 21: axial dynamic 3 · axial · 4.0mm · 1.32mm/px · z∈[-85,+135]mm · 3 of 56 slices shown (1 of 2)]
[im 1/56]
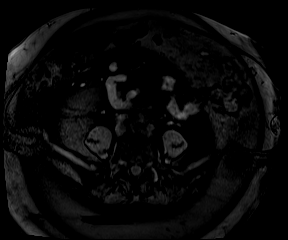
[im 28/56]
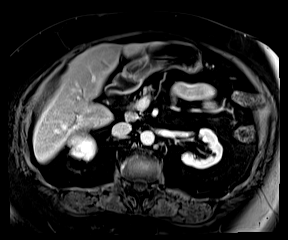
[im 56/56]
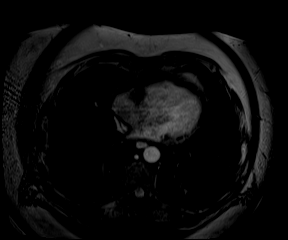

[Series 22: axial dynamic 3 · axial · 4.0mm · 1.32mm/px · z∈[-85,+135]mm · 3 of 56 slices shown (2 of 2)]
[im 1/56]
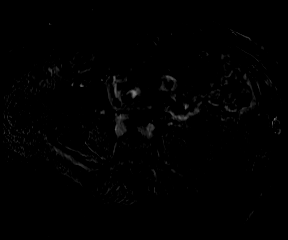
[im 28/56]
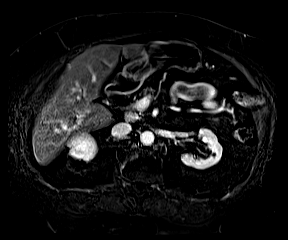
[im 56/56]
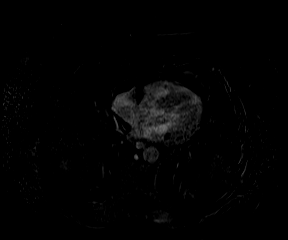

[44 of 48 positions shown; findings below may reference images not displayed]

FINDINGS: Lower chest: No acute abnormality.

Hepatobiliary: No significant hepatic steatosis. No suspicious
hepatic lesion. Gallbladder is decompressed around cholelithiasis.
Prominence of the central intra and extrahepatic biliary tree with
the common duct measuring 9 mm and a 4 mm choledocholithiasis in the
distal common duct on coronal haste image [DATE] and axial haste image
[DATE].

Pancreas: No pancreatic ductal dilation. Intrinsic T1 signal of the
pancreatic parenchyma is within normal limits. Homogeneous
postcontrast enhancement of the pancreas.

Spleen:  No splenomegaly or focal splenic lesion.

Adrenals/Urinary Tract:  Bilateral adrenal glands appear normal.

Bilateral tiny T2 hyperintense renal lesions do not demonstrate
abnormal enhancement and while technically too small to accurately
characterize likely reflect benign renal cysts. No solid enhancing
renal mass. No hydronephrosis. Mild left-greater-than-right cortical
renal atrophy.

Stomach/Bowel: Visualized portions within the abdomen are
unremarkable.

Vascular/Lymphatic: Mildly enlarged/prominent periportal lymph node
measures up to 11 mm in short axis on image 19/18. The portal,
splenic and superior mesenteric veins are patent. Normal caliber
abdominal aorta.

Other:  No significant abdominopelvic free fluid.

Musculoskeletal: No suspicious bone lesions identified.
IMPRESSION: 1. Subcentimeter bilateral fluid intensity renal lesions are
technically too small to accurately characterize but are most
consistent with benign renal cysts. No solid enhancing renal mass.
2. Biliary ductal dilation with a 4 mm choledocholithiasis in the
distal common duct, recommend correlation with laboratory values to
assess for biliary obstruction.
3. Cholelithiasis without findings of acute cholecystitis.
4. Mildly enlarged/prominent periportal lymph node measures up to 11
mm in short axis. This is nonspecific and may be reactive.

These results will be called to the ordering clinician or
representative by the Radiologist Assistant, and communication
documented in the PACS or [REDACTED].

## 2023-07-21 ENCOUNTER — Encounter: Payer: Self-pay | Admitting: Nurse Practitioner

## 2023-07-21 ENCOUNTER — Ambulatory Visit: Payer: Medicare HMO | Attending: Nurse Practitioner | Admitting: Nurse Practitioner

## 2023-07-21 ENCOUNTER — Other Ambulatory Visit: Payer: Self-pay

## 2023-07-21 VITALS — BP 159/84 | HR 76 | Ht 61.0 in | Wt 223.6 lb

## 2023-07-21 DIAGNOSIS — E1122 Type 2 diabetes mellitus with diabetic chronic kidney disease: Secondary | ICD-10-CM | POA: Diagnosis not present

## 2023-07-21 DIAGNOSIS — N183 Chronic kidney disease, stage 3 unspecified: Secondary | ICD-10-CM | POA: Diagnosis not present

## 2023-07-21 DIAGNOSIS — B9689 Other specified bacterial agents as the cause of diseases classified elsewhere: Secondary | ICD-10-CM | POA: Diagnosis not present

## 2023-07-21 DIAGNOSIS — Z794 Long term (current) use of insulin: Secondary | ICD-10-CM | POA: Diagnosis not present

## 2023-07-21 DIAGNOSIS — I10 Essential (primary) hypertension: Secondary | ICD-10-CM | POA: Diagnosis not present

## 2023-07-21 DIAGNOSIS — J019 Acute sinusitis, unspecified: Secondary | ICD-10-CM | POA: Diagnosis not present

## 2023-07-21 MED ORDER — DOXYCYCLINE HYCLATE 100 MG PO TABS
100.0000 mg | ORAL_TABLET | Freq: Two times a day (BID) | ORAL | 0 refills | Status: AC
  Filled 2023-07-21: qty 20, 10d supply, fill #0

## 2023-07-21 MED ORDER — BLOOD PRESSURE MONITOR DEVI: 0 refills | Status: DC

## 2023-07-21 MED ORDER — CARVEDILOL 6.25 MG PO TABS
6.2500 mg | ORAL_TABLET | Freq: Two times a day (BID) | ORAL | 1 refills | Status: DC
  Filled 2023-07-21: qty 180, 90d supply, fill #0
  Filled 2023-10-12 (×2): qty 180, 90d supply, fill #1

## 2023-07-21 NOTE — Progress Notes (Signed)
 Assessment & Plan:  [name] was seen today for nasal congestion and headache.  Diagnoses and all orders for this visit:  Primary hypertension -     Blood Pressure Monitor DEVI; Please provide patient with insurance approved blood pressure monitor. I10.0 -     carvedilol  (COREG ) 6.25 MG tablet; Take 1 tablet (6.25 mg total) by mouth 2 (two) times daily with a meal. -     CMP14+EGFR Continue all antihypertensives as prescribed.  Reminded to bring in blood pressure log for follow  up appointment.  RECOMMENDATIONS: DASH/Mediterranean Diets are healthier choices for HTN.    CKD stage 3 secondary to diabetes (HCC) -     CMP14+EGFR  Acute bacterial sinusitis -     doxycycline  (VIBRA -TABS) 100 MG tablet; Take 1 tablet (100 mg total) by mouth 2 (two) times daily for 10 days.    Patient has been counseled on age-appropriate routine health concerns for screening and prevention. These are reviewed and up-to-date. Referrals have been placed accordingly. Immunizations are up-to-date or declined.    Subjective:   Chief Complaint  Patient presents with   Nasal Congestion    Patient stated that she noticed some blood when blowing her nose   Headache    Kristy Flores 67 y.o. female presents to office today  for follow up to HTN and with symptoms of sinusitis.    She has a past medical history of Depression, DM2, CKD stage 3b, Hyperlipidemia, Hypertension, Insomnia, and Restless leg syndrome    Patient has been counseled on age-appropriate routine health concerns for screening and prevention. These are reviewed and up-to-date. Referrals have been placed accordingly. Immunizations are up-to-date or declined. MAMMOGRAM: Declines PAP SMEAR: Declines COLON CANCER SCREEN: Declines      HTN  She was taken off lasix  and lisinopril  by her nephrologist. Blood pressure is elevated today . Will increase carvedilol . She does endorse increased swelling in her lower legs.  BP Readings from Last 3  Encounters:  07/21/23 (!) 159/84  04/14/23 115/69  02/25/23 (!) 151/80     Sinus Pain: Patient complains of achiness, congestion, post nasal drip, purulent nasal discharge, blood streaked sinus drainage, sinus pressure, left sided facial pain, and sore throat. Onset of symptoms was several days ago, gradually worsening since that time. Patient is non-smoker   Review of Systems  Constitutional:  Negative for fever, malaise/fatigue and weight loss.  HENT:  Positive for congestion, sinus pain and sore throat. Negative for nosebleeds.   Eyes: Negative.  Negative for blurred vision, double vision and photophobia.  Respiratory: Negative.  Negative for cough and shortness of breath.   Cardiovascular: Negative.  Negative for chest pain, palpitations and leg swelling.  Gastrointestinal: Negative.  Negative for heartburn, nausea and vomiting.  Musculoskeletal: Negative.  Negative for myalgias.  Neurological: Negative.  Negative for dizziness, focal weakness, seizures and headaches.  Psychiatric/Behavioral: Negative.  Negative for suicidal ideas.     Past Medical History:  Diagnosis Date   Anemia    Anxiety    Chronic kidney disease    stage III   Depression    Diabetes mellitus without complication (HCC)    Headache    Hyperlipidemia    Hypertension    Insomnia    Melanoma (HCC)    Back   Pneumonia    Restless leg syndrome     Past Surgical History:  Procedure Laterality Date   ABDOMINAL HYSTERECTOMY     CESAREAN SECTION     CHOLECYSTECTOMY N/A 04/25/2022  Procedure: LAPAROSCOPIC CHOLECYSTECTOMY WITH ICG DYE;  Surgeon: Tanda Locus, MD;  Location: [facility];  Service: General;  Laterality: N/A;   ERCP N/A 02/13/2022   Procedure: ENDOSCOPIC RETROGRADE CHOLANGIOPANCREATOGRAPHY (ERCP);  Surgeon: Teressa Toribio SQUIBB, MD;  Location: [facility];  Service: Gastroenterology;  Laterality: N/A;   INCISIONAL HERNIA REPAIR N/A 02/24/2023   Procedure: LAPAROSCOPIC INCISIONAL HERNIA WITH MESH;   Surgeon: Tanda Locus, MD;  Location: [facility];  Service: General;  Laterality: N/A;   MELANOMA EXCISION  1985   REMOVAL OF STONES  02/13/2022   Procedure: REMOVAL OF STONES;  Surgeon: Teressa Toribio SQUIBB, MD;  Location: [facility];  Service: Gastroenterology;;   ANNETT  02/13/2022   Procedure: ANNETT;  Surgeon: Teressa Toribio SQUIBB, MD;  Location: [facility];  Service: Gastroenterology;;   UPPER GI ENDOSCOPY      Family History  Problem Relation Age of Onset   Kidney disease Mother    Diabetes Mother    Hypertension Mother    Cancer Father     Social History Reviewed with no changes to be made today.   Outpatient Medications Prior to Visit  Medication Sig Dispense Refill   allopurinol  (ZYLOPRIM ) 100 MG tablet Take 2 tablets (200 mg total) by mouth daily. 180 tablet 1   Blood Glucose Monitoring Suppl (ONETOUCH VERIO FLEX SYSTEM) w/Device KIT Use as directed daily. 1 kit 0   cholecalciferol (VITAMIN D3) 25 MCG (1000 UNIT) tablet Take 1,000 Units by mouth daily.     DULoxetine  (CYMBALTA ) 30 MG capsule Take 1 capsule (30 mg total) by mouth 2 (two) times daily. 180 capsule 1   furosemide  (LASIX ) 20 MG tablet Take 1 tablet (20 mg total) by mouth every other day. 45 tablet 1   glimepiride  (AMARYL ) 4 MG tablet Take 1 tablet (4 mg total) by mouth daily before breakfast. 90 tablet 1   Glucose Blood (BLOOD GLUCOSE TEST STRIPS) STRP Use as directed daily. 100 strip 1   Insulin  Glargine (BASAGLAR  KWIKPEN) 100 UNIT/ML Inject 15 Units into the skin at bedtime. 15 mL 3   Insulin  Pen Needle (B-D ULTRAFINE III SHORT PEN) 31G X 8 MM MISC USE AS INSTRUCTED. INJECT INTO THE SKIN ONCE NIGHTLY. 100 each 0   Lancet Device MISC use daily. 1 each 0   Lancets (ONETOUCH DELICA PLUS LANCET33G) MISC Use as directed daily. 100 each 1   lovastatin  (MEVACOR ) 40 MG tablet Take 1 tablet (40 mg total) by mouth at bedtime. 90 tablet 1   mupirocin  ointment (BACTROBAN ) 2 % Apply 1 Application topically 2 (two)  times daily. 22 g 0   sodium bicarbonate  650 MG tablet Take 1 tablet (650 mg total) by mouth 2 (two) times daily. 60 tablet 11   traMADol  (ULTRAM ) 50 MG tablet Take 1 tablet (50 mg total) by mouth every 6 (six) hours as needed for moderate pain (scale 4-6) 28 tablet 0   triamcinolone  (KENALOG ) 0.025 % ointment Apply 1 Application topically 2 (two) times daily. Apply to rash on hand 60 g 0   lisinopril  (ZESTRIL ) 30 MG tablet Take 1 tablet (30 mg total) by mouth daily. 90 tablet 1   No facility-administered medications prior to visit.    Allergies  Allergen Reactions   Chlorzoxazone Nausea And Vomiting   Floxuridine     Hypoglycemia / pass out   Codeine  Nausea And Vomiting   Gabapentin Swelling    AKI   Nsaids Other (See Comments)    CKD   Penicillins  Broke out, stomach upset.    Victoza [Liraglutide]     Shut down kidneys        Objective:    BP (!) 159/84   Pulse 76   Wt 223 lb 9.6 oz (101.4 kg)   SpO2 99%   BMI 42.25 kg/m  Wt Readings from Last 3 Encounters:  07/21/23 223 lb 9.6 oz (101.4 kg)  04/14/23 218 lb 12.8 oz (99.2 kg)  02/24/23 213 lb (96.6 kg)    Physical Exam Vitals and nursing note reviewed.  Constitutional:      Appearance: She is well-developed.  HENT:     Head: Normocephalic and atraumatic.     Nose:     Right Turbinates: Enlarged and swollen.     Left Turbinates: Enlarged and swollen.     Right Sinus: Frontal sinus tenderness present.     Left Sinus: Frontal sinus tenderness present.     Comments: Erythematous nasal mucosa Cardiovascular:     Rate and Rhythm: Normal rate and regular rhythm.     Heart sounds: Normal heart sounds. No murmur heard.    No friction rub. No gallop.  Pulmonary:     Effort: Pulmonary effort is normal. No tachypnea or respiratory distress.     Breath sounds: Normal breath sounds. No decreased breath sounds, wheezing, rhonchi or rales.  Chest:     Chest wall: No tenderness.  Abdominal:     General: Bowel sounds  are normal.     Palpations: Abdomen is soft.  Musculoskeletal:        General: Normal range of motion.     Cervical back: Normal range of motion.  Skin:    General: Skin is warm and dry.  Neurological:     Mental Status: She is alert and oriented to person, place, and time.     Coordination: Coordination normal.  Psychiatric:        Behavior: Behavior normal. Behavior is cooperative.        Thought Content: Thought content normal.        Judgment: Judgment normal.          Patient has been counseled extensively about nutrition and exercise as well as the importance of adherence with medications and regular follow-up. The patient was given clear instructions to go to ER or return to medical center if symptoms don't improve, worsen or new problems develop. The patient verbalized understanding.   Follow-up: Return in about 3 months (around 10/19/2023).   Haze LELON Servant, FNP-BC [facility] [place], [place] [phone]   07/24/2023, 9:30 PM

## 2023-07-21 NOTE — Patient Instructions (Signed)
Call [facility] for your blood pressure machine [phone]

## 2023-07-22 LAB — CMP14+EGFR
ALT: 13 [IU]/L (ref 0–32)
AST: 13 [IU]/L (ref 0–40)
Albumin: 3.9 g/dL (ref 3.9–4.9)
Alkaline Phosphatase: 75 [IU]/L (ref 44–121)
BUN/Creatinine Ratio: 28 (ref 12–28)
BUN: 34 mg/dL — ABNORMAL HIGH (ref 8–27)
Bilirubin Total: <0.2 mg/dL (ref 0.0–1.2)
CO2: 19 mmol/L — ABNORMAL LOW (ref 20–29)
Calcium: 9.5 mg/dL (ref 8.7–10.3)
Chloride: 108 mmol/L — ABNORMAL HIGH (ref 96–106)
Creatinine, Ser: 1.23 mg/dL — ABNORMAL HIGH (ref 0.57–1.00)
Globulin, Total: 2.7 g/dL (ref 1.5–4.5)
Glucose: 91 mg/dL (ref 70–99)
Potassium: 4.5 mmol/L (ref 3.5–5.2)
Sodium: 145 mmol/L — ABNORMAL HIGH (ref 134–144)
Total Protein: 6.6 g/dL (ref 6.0–8.5)
eGFR: 49 mL/min/{1.73_m2} — ABNORMAL LOW (ref >59)

## 2023-07-24 ENCOUNTER — Encounter: Payer: Self-pay | Admitting: Nurse Practitioner

## 2023-07-24 ENCOUNTER — Other Ambulatory Visit: Payer: Self-pay

## 2023-07-27 ENCOUNTER — Other Ambulatory Visit: Payer: Self-pay

## 2023-07-27 ENCOUNTER — Encounter: Payer: Medicare HMO | Admitting: Nurse Practitioner

## 2023-07-27 ENCOUNTER — Other Ambulatory Visit: Payer: Self-pay | Admitting: Nurse Practitioner

## 2023-07-27 DIAGNOSIS — E1165 Type 2 diabetes mellitus with hyperglycemia: Secondary | ICD-10-CM

## 2023-07-27 MED ORDER — BD PEN NEEDLE SHORT U/F 31G X 8 MM MISC
2 refills | Status: DC
  Filled 2023-07-27: qty 100, 100d supply, fill #0
  Filled 2023-11-05: qty 100, 100d supply, fill #1

## 2023-07-28 ENCOUNTER — Other Ambulatory Visit: Payer: Self-pay

## 2023-07-29 ENCOUNTER — Other Ambulatory Visit: Payer: Self-pay

## 2023-07-30 ENCOUNTER — Other Ambulatory Visit: Payer: Self-pay

## 2023-07-31 ENCOUNTER — Other Ambulatory Visit: Payer: Self-pay

## 2023-08-11 ENCOUNTER — Other Ambulatory Visit: Payer: Self-pay

## 2023-08-18 ENCOUNTER — Other Ambulatory Visit: Payer: Self-pay

## 2023-08-18 ENCOUNTER — Ambulatory Visit: Payer: Medicare HMO | Attending: Nurse Practitioner | Admitting: Nurse Practitioner

## 2023-08-18 ENCOUNTER — Encounter: Payer: Self-pay | Admitting: Nurse Practitioner

## 2023-08-18 VITALS — BP 146/78 | HR 58 | Resp 20 | Ht 61.0 in | Wt 219.8 lb

## 2023-08-18 DIAGNOSIS — E785 Hyperlipidemia, unspecified: Secondary | ICD-10-CM | POA: Diagnosis not present

## 2023-08-18 DIAGNOSIS — L301 Dyshidrosis [pompholyx]: Secondary | ICD-10-CM | POA: Diagnosis not present

## 2023-08-18 DIAGNOSIS — Z794 Long term (current) use of insulin: Secondary | ICD-10-CM

## 2023-08-18 DIAGNOSIS — G894 Chronic pain syndrome: Secondary | ICD-10-CM | POA: Diagnosis not present

## 2023-08-18 DIAGNOSIS — Z0000 Encounter for general adult medical examination without abnormal findings: Secondary | ICD-10-CM | POA: Diagnosis not present

## 2023-08-18 DIAGNOSIS — E1122 Type 2 diabetes mellitus with diabetic chronic kidney disease: Secondary | ICD-10-CM

## 2023-08-18 DIAGNOSIS — L089 Local infection of the skin and subcutaneous tissue, unspecified: Secondary | ICD-10-CM | POA: Diagnosis not present

## 2023-08-18 DIAGNOSIS — I10 Essential (primary) hypertension: Secondary | ICD-10-CM

## 2023-08-18 DIAGNOSIS — E1165 Type 2 diabetes mellitus with hyperglycemia: Secondary | ICD-10-CM | POA: Diagnosis not present

## 2023-08-18 DIAGNOSIS — B9689 Other specified bacterial agents as the cause of diseases classified elsewhere: Secondary | ICD-10-CM | POA: Diagnosis not present

## 2023-08-18 DIAGNOSIS — M1A09X0 Idiopathic chronic gout, multiple sites, without tophus (tophi): Secondary | ICD-10-CM

## 2023-08-18 DIAGNOSIS — N183 Chronic kidney disease, stage 3 unspecified: Secondary | ICD-10-CM

## 2023-08-18 MED ORDER — GLIMEPIRIDE 4 MG PO TABS
4.0000 mg | ORAL_TABLET | Freq: Every day | ORAL | 1 refills | Status: DC
  Filled 2023-08-18 – 2023-10-30 (×2): qty 90, 90d supply, fill #0
  Filled 2024-01-25: qty 90, 90d supply, fill #1

## 2023-08-18 MED ORDER — FUROSEMIDE 20 MG PO TABS
20.0000 mg | ORAL_TABLET | ORAL | 1 refills | Status: DC
  Filled 2023-08-18 – 2023-11-09 (×2): qty 45, 90d supply, fill #0

## 2023-08-18 MED ORDER — AMLODIPINE BESYLATE 5 MG PO TABS
5.0000 mg | ORAL_TABLET | Freq: Every day | ORAL | 1 refills | Status: DC
  Filled 2023-08-18: qty 90, 90d supply, fill #0
  Filled 2023-11-09: qty 90, 90d supply, fill #1

## 2023-08-18 MED ORDER — LOVASTATIN 40 MG PO TABS
40.0000 mg | ORAL_TABLET | Freq: Every day | ORAL | 1 refills | Status: DC
  Filled 2023-08-18 – 2023-10-12 (×2): qty 90, 90d supply, fill #0
  Filled 2024-01-11: qty 90, 90d supply, fill #1

## 2023-08-18 MED ORDER — MUPIROCIN 2 % EX OINT
1.0000 | TOPICAL_OINTMENT | Freq: Two times a day (BID) | CUTANEOUS | 0 refills | Status: DC
  Filled 2023-08-18: qty 22, 11d supply, fill #0

## 2023-08-18 MED ORDER — TRIAMCINOLONE ACETONIDE 0.025 % EX OINT
1.0000 | TOPICAL_OINTMENT | Freq: Two times a day (BID) | CUTANEOUS | 1 refills | Status: DC
  Filled 2023-08-18: qty 60, 30d supply, fill #0
  Filled 2023-09-10: qty 60, 30d supply, fill #1

## 2023-08-18 MED ORDER — DULOXETINE HCL 30 MG PO CPEP
30.0000 mg | ORAL_CAPSULE | Freq: Two times a day (BID) | ORAL | 1 refills | Status: DC
  Filled 2023-08-18 – 2023-10-19 (×2): qty 180, 90d supply, fill #0
  Filled 2024-01-15: qty 180, 90d supply, fill #1

## 2023-08-18 MED ORDER — ALLOPURINOL 100 MG PO TABS
200.0000 mg | ORAL_TABLET | Freq: Every day | ORAL | 1 refills | Status: DC
  Filled 2023-08-18 – 2023-10-12 (×2): qty 180, 90d supply, fill #0
  Filled 2024-01-11: qty 180, 90d supply, fill #1

## 2023-08-18 NOTE — Progress Notes (Signed)
Subjective:    Kristy Flores is a 68 y.o. female who presents for a Welcome to Medicare exam.   Cardiac Risk Factors include: diabetes mellitus;hypertension      Objective:    Today's Vitals   08/18/23 0950 08/18/23 1024  BP: (!) 150/78 (!) 146/78  Pulse: (!) 58   Resp: 20   SpO2: 98%   Weight: 219 lb 12.8 oz (99.7 kg)   Height: 5\' 1"  (1.549 m)   Body mass index is 41.53 kg/m.  Medications Outpatient Encounter Medications as of 08/18/2023  Medication Sig   amLODipine (NORVASC) 5 MG tablet Take 1 tablet (5 mg total) by mouth daily.   Blood Glucose Monitoring Suppl (ONETOUCH VERIO FLEX SYSTEM) w/Device KIT Use as directed daily.   Blood Pressure Monitor DEVI Please provide patient with insurance approved blood pressure monitor. I10.0   carvedilol (COREG) 6.25 MG tablet Take 1 tablet (6.25 mg total) by mouth 2 (two) times daily with a meal.   Glucose Blood (BLOOD GLUCOSE TEST STRIPS) STRP Use as directed daily.   Insulin Glargine (BASAGLAR KWIKPEN) 100 UNIT/ML Inject 15 Units into the skin at bedtime.   Insulin Pen Needle (B-D ULTRAFINE III SHORT PEN) 31G X 8 MM MISC USE AS INSTRUCTED. INJECT INTO THE SKIN ONCE NIGHTLY.   Lancet Device MISC use daily.   Lancets (ONETOUCH DELICA PLUS LANCET33G) MISC Use as directed daily.   sodium bicarbonate 650 MG tablet Take 1 tablet (650 mg total) by mouth 2 (two) times daily.   [DISCONTINUED] allopurinol (ZYLOPRIM) 100 MG tablet Take 2 tablets (200 mg total) by mouth daily.   [DISCONTINUED] DULoxetine (CYMBALTA) 30 MG capsule Take 1 capsule (30 mg total) by mouth 2 (two) times daily.   [DISCONTINUED] furosemide (LASIX) 20 MG tablet Take 1 tablet (20 mg total) by mouth every other day.   [DISCONTINUED] glimepiride (AMARYL) 4 MG tablet Take 1 tablet (4 mg total) by mouth daily before breakfast.   [DISCONTINUED] lovastatin (MEVACOR) 40 MG tablet Take 1 tablet (40 mg total) by mouth at bedtime.   [DISCONTINUED] mupirocin ointment  (BACTROBAN) 2 % Apply 1 Application topically 2 (two) times daily.   [DISCONTINUED] triamcinolone (KENALOG) 0.025 % ointment Apply 1 Application topically 2 (two) times daily. Apply to rash on hand   allopurinol (ZYLOPRIM) 100 MG tablet Take 2 tablets (200 mg total) by mouth daily.   cholecalciferol (VITAMIN D3) 25 MCG (1000 UNIT) tablet Take 1,000 Units by mouth daily. (Patient not taking: Reported on 08/18/2023)   DULoxetine (CYMBALTA) 30 MG capsule Take 1 capsule (30 mg total) by mouth 2 (two) times daily.   furosemide (LASIX) 20 MG tablet Take 1 tablet (20 mg total) by mouth every other day.   glimepiride (AMARYL) 4 MG tablet Take 1 tablet (4 mg total) by mouth daily before breakfast.   lovastatin (MEVACOR) 40 MG tablet Take 1 tablet (40 mg total) by mouth at bedtime.   mupirocin ointment (BACTROBAN) 2 % Apply 1 Application topically 2 (two) times daily.   triamcinolone (KENALOG) 0.025 % ointment Apply to rash on hand 2 (two) times daily.   [DISCONTINUED] citalopram (CELEXA) 20 MG tablet Take 1 tablet (20 mg total) by mouth daily. (Patient not taking: Reported on 09/05/2020)   No facility-administered encounter medications on file as of 08/18/2023.     History: Past Medical History:  Diagnosis Date   Anemia    Anxiety    Chronic kidney disease    stage III   Depression  Diabetes mellitus without complication (HCC)    Headache    Hyperlipidemia    Hypertension    Insomnia    Melanoma (HCC)    Back   Pneumonia    Restless leg syndrome    Past Surgical History:  Procedure Laterality Date   ABDOMINAL HYSTERECTOMY     CESAREAN SECTION     CHOLECYSTECTOMY N/A 04/25/2022   Procedure: LAPAROSCOPIC CHOLECYSTECTOMY WITH ICG DYE;  Surgeon: Gaynelle Adu, MD;  Location: [facility];  Service: General;  Laterality: N/A;   ERCP N/A 02/13/2022   Procedure: ENDOSCOPIC RETROGRADE CHOLANGIOPANCREATOGRAPHY (ERCP);  Surgeon: Rachael Fee, MD;  Location: [facility];  Service: Gastroenterology;   Laterality: N/A;   INCISIONAL HERNIA REPAIR N/A 02/24/2023   Procedure: LAPAROSCOPIC INCISIONAL HERNIA WITH MESH;  Surgeon: Gaynelle Adu, MD;  Location: [facility];  Service: General;  Laterality: N/A;   MELANOMA EXCISION  1985   REMOVAL OF STONES  02/13/2022   Procedure: REMOVAL OF STONES;  Surgeon: Rachael Fee, MD;  Location: [facility];  Service: Gastroenterology;;   Dennison Mascot  02/13/2022   Procedure: Dennison Mascot;  Surgeon: Rachael Fee, MD;  Location: [facility];  Service: Gastroenterology;;   UPPER GI ENDOSCOPY      Family History  Problem Relation Age of Onset   Kidney disease Mother    Diabetes Mother    Hypertension Mother    Cancer Father    Social History   Occupational History   Not on file  Tobacco Use   Smoking status: Never   Smokeless tobacco: Never  Vaping Use   Vaping status: Never Used  Substance and Sexual Activity   Alcohol use: Not Currently   Drug use: Not Currently   Sexual activity: Not Currently    Tobacco Counseling Counseling given: Yes   Immunizations and Health Maintenance Immunization History  Administered Date(s) Administered   Fluad Trivalent(High Dose 65+) 04/14/2023   Hepatitis B, ADULT 02/12/2016   Influenza,inj,Quad PF,6+ Mos 04/12/2019, 04/25/2020, 04/25/2020, 06/19/2021, 04/14/2022   Pneumococcal Polysaccharide-23 01/07/2016   Td 02/12/2016   Td (Adult), 2 Lf Tetanus Toxid, Preservative Free 02/12/2016   Health Maintenance Due  Topic Date Due   FOOT EXAM  Never done   OPHTHALMOLOGY EXAM  02/18/2023    Activities of Daily Living    08/18/2023    9:40 AM 02/24/2023    8:00 PM  In your present state of health, do you have any difficulty performing the following activities:  Hearing? 0 0  Vision? 1 0  Difficulty concentrating or making decisions? 1 0  Comment concentrating   Walking or climbing stairs? 0 0  Dressing or bathing? 0 0  Doing errands, shopping? 0 0  Preparing Food and eating ? N   Using the  Toilet? N   In the past six months, have you accidently leaked urine? N   Do you have problems with loss of bowel control? N   Managing your Medications? N   Managing your Finances? N   Housekeeping or managing your Housekeeping? N     Physical Exam   Physical Exam (optional), or other factors deemed appropriate based on the beneficiary's medical and social history and current clinical standards.   Advanced Directives: Does Patient Have a Medical Advance Directive?: No Would patient like information on creating a medical advance directive?: No - Patient declined  EKG:  normal sinus rhythm      Assessment:    This is a routine wellness examination for this patient .  Vision/Hearing screen Vision Screening   Right eye Left eye Both eyes  Without correction 20/40 20/200 20/30  With correction        Goals   None    Depression Screen    08/18/2023    9:52 AM 07/21/2023    8:55 AM 04/14/2023    9:23 AM 01/12/2023    8:53 AM  PHQ 2/9 Scores  PHQ - 2 Score 3 4 5 3   PHQ- 9 Score 16 17 21 15      Fall Risk    08/18/2023    9:48 AM  Fall Risk   Falls in the past year? 1  Number falls in past yr: 0  Injury with Fall? 1  Risk for fall due to : No Fall Risks  Follow up Falls evaluation completed    Cognitive Function:        08/18/2023    9:41 AM  6CIT Screen  What Year? 0 points  What month? 0 points  What time? 0 points  Count back from 20 2 points  Months in reverse 2 points  Repeat phrase 0 points  Total Score 4 points    Patient Care Team: Claiborne Rigg, NP as PCP - General (Nurse Practitioner)     Plan:   Kristy Flores was seen today for medicare wellness.  Diagnoses and all orders for this visit:  Encounter for Medicare annual wellness exam -     EKG 12-Lead  Primary hypertension Add amlodipine today. Continue carvedilol -     amLODipine (NORVASC) 5 MG tablet; Take 1 tablet (5 mg total) by mouth daily. BP Readings from Last 3 Encounters:   08/18/23 (!) 146/78  07/21/23 (!) 159/84  04/14/23 115/69        I have personally reviewed and noted the following in the patient's chart:   Medical and social history Use of alcohol, tobacco or illicit drugs  Current medications and supplements Functional ability and status Nutritional status Physical activity Advanced directives List of other physicians Hospitalizations, surgeries, and ER visits in previous 12 months Vitals Screenings to include cognitive, depression, and falls Referrals and appointments  In addition, I have reviewed and discussed with patient certain preventive protocols, quality metrics, and best practice recommendations. A written personalized care plan for preventive services as well as general preventive health recommendations were provided to patient.     Claiborne Rigg, NP 08/18/2023

## 2023-09-02 ENCOUNTER — Other Ambulatory Visit: Payer: Self-pay

## 2023-09-03 ENCOUNTER — Other Ambulatory Visit: Payer: Self-pay

## 2023-09-03 DIAGNOSIS — E872 Acidosis, unspecified: Secondary | ICD-10-CM | POA: Diagnosis not present

## 2023-09-03 DIAGNOSIS — E1129 Type 2 diabetes mellitus with other diabetic kidney complication: Secondary | ICD-10-CM | POA: Diagnosis not present

## 2023-09-03 DIAGNOSIS — N2581 Secondary hyperparathyroidism of renal origin: Secondary | ICD-10-CM | POA: Diagnosis not present

## 2023-09-03 DIAGNOSIS — E1122 Type 2 diabetes mellitus with diabetic chronic kidney disease: Secondary | ICD-10-CM | POA: Diagnosis not present

## 2023-09-03 DIAGNOSIS — E875 Hyperkalemia: Secondary | ICD-10-CM | POA: Diagnosis not present

## 2023-09-03 DIAGNOSIS — N179 Acute kidney failure, unspecified: Secondary | ICD-10-CM | POA: Diagnosis not present

## 2023-09-03 DIAGNOSIS — N1832 Chronic kidney disease, stage 3b: Secondary | ICD-10-CM | POA: Diagnosis not present

## 2023-09-03 DIAGNOSIS — R809 Proteinuria, unspecified: Secondary | ICD-10-CM | POA: Diagnosis not present

## 2023-09-03 DIAGNOSIS — I129 Hypertensive chronic kidney disease with stage 1 through stage 4 chronic kidney disease, or unspecified chronic kidney disease: Secondary | ICD-10-CM | POA: Diagnosis not present

## 2023-09-03 MED ORDER — DAPAGLIFLOZIN PROPANEDIOL 10 MG PO TABS
10.0000 mg | ORAL_TABLET | Freq: Every day | ORAL | 11 refills | Status: AC
  Filled 2023-09-03: qty 30, 30d supply, fill #0
  Filled 2023-09-28: qty 30, 30d supply, fill #1
  Filled 2023-10-26: qty 30, 30d supply, fill #2
  Filled 2023-11-25: qty 30, 30d supply, fill #3
  Filled 2023-12-21: qty 30, 30d supply, fill #4
  Filled 2024-01-18: qty 30, 30d supply, fill #5
  Filled 2024-02-16: qty 30, 30d supply, fill #6
  Filled 2024-03-17: qty 30, 30d supply, fill #7
  Filled 2024-04-11: qty 30, 30d supply, fill #8
  Filled 2024-05-10: qty 30, 30d supply, fill #9
  Filled 2024-06-09: qty 30, 30d supply, fill #10
  Filled 2024-07-04: qty 30, 30d supply, fill #11

## 2023-09-08 LAB — LAB REPORT - SCANNED
Creatinine, POC: 76.1 mg/dL
EGFR: 36

## 2023-09-10 ENCOUNTER — Other Ambulatory Visit: Payer: Self-pay

## 2023-09-11 ENCOUNTER — Other Ambulatory Visit: Payer: Self-pay

## 2023-09-24 DIAGNOSIS — N1832 Chronic kidney disease, stage 3b: Secondary | ICD-10-CM | POA: Diagnosis not present

## 2023-09-28 ENCOUNTER — Other Ambulatory Visit: Payer: Self-pay

## 2023-10-05 ENCOUNTER — Other Ambulatory Visit: Payer: Self-pay

## 2023-10-12 ENCOUNTER — Other Ambulatory Visit: Payer: Self-pay

## 2023-10-19 ENCOUNTER — Other Ambulatory Visit: Payer: Self-pay

## 2023-10-21 ENCOUNTER — Other Ambulatory Visit: Payer: Self-pay

## 2023-10-27 ENCOUNTER — Other Ambulatory Visit: Payer: Self-pay

## 2023-10-30 ENCOUNTER — Other Ambulatory Visit: Payer: Self-pay

## 2023-11-04 ENCOUNTER — Other Ambulatory Visit: Payer: Self-pay

## 2023-11-05 ENCOUNTER — Other Ambulatory Visit: Payer: Self-pay

## 2023-11-05 DIAGNOSIS — S81812A Laceration without foreign body, left lower leg, initial encounter: Secondary | ICD-10-CM | POA: Diagnosis not present

## 2023-11-05 DIAGNOSIS — Z23 Encounter for immunization: Secondary | ICD-10-CM | POA: Diagnosis not present

## 2023-11-09 ENCOUNTER — Other Ambulatory Visit: Payer: Self-pay

## 2023-11-11 ENCOUNTER — Other Ambulatory Visit: Payer: Self-pay

## 2023-11-11 DIAGNOSIS — L03116 Cellulitis of left lower limb: Secondary | ICD-10-CM | POA: Diagnosis not present

## 2023-11-11 MED ORDER — CLINDAMYCIN HCL 300 MG PO CAPS
300.0000 mg | ORAL_CAPSULE | Freq: Three times a day (TID) | ORAL | 0 refills | Status: DC
  Filled 2023-11-11: qty 30, 10d supply, fill #0

## 2023-11-16 ENCOUNTER — Ambulatory Visit: Payer: Medicare HMO | Attending: Nurse Practitioner | Admitting: Nurse Practitioner

## 2023-11-16 ENCOUNTER — Telehealth: Payer: Self-pay | Admitting: Nurse Practitioner

## 2023-11-16 ENCOUNTER — Encounter: Payer: Self-pay | Admitting: Nurse Practitioner

## 2023-11-16 ENCOUNTER — Other Ambulatory Visit: Payer: Self-pay

## 2023-11-16 VITALS — BP 126/80 | HR 61 | Resp 19 | Ht 61.0 in | Wt 219.4 lb

## 2023-11-16 DIAGNOSIS — Z794 Long term (current) use of insulin: Secondary | ICD-10-CM | POA: Diagnosis not present

## 2023-11-16 DIAGNOSIS — I10 Essential (primary) hypertension: Secondary | ICD-10-CM

## 2023-11-16 DIAGNOSIS — E1165 Type 2 diabetes mellitus with hyperglycemia: Secondary | ICD-10-CM | POA: Diagnosis not present

## 2023-11-16 LAB — POCT GLYCOSYLATED HEMOGLOBIN (HGB A1C): HbA1c, POC (controlled diabetic range): 6.5 % (ref 0.0–7.0)

## 2023-11-16 MED ORDER — AMLODIPINE BESYLATE 10 MG PO TABS
10.0000 mg | ORAL_TABLET | Freq: Every day | ORAL | 1 refills | Status: DC
  Filled 2023-11-16: qty 90, 90d supply, fill #0

## 2023-11-16 MED ORDER — BASAGLAR KWIKPEN 100 UNIT/ML ~~LOC~~ SOPN
15.0000 [IU] | PEN_INJECTOR | Freq: Every day | SUBCUTANEOUS | 3 refills | Status: AC
  Filled 2023-11-16: qty 15, 100d supply, fill #0
  Filled 2024-02-04: qty 15, 100d supply, fill #1
  Filled 2024-05-18: qty 15, 100d supply, fill #2
  Filled 2024-08-22: qty 3, 20d supply, fill #3

## 2023-11-16 MED ORDER — AMLODIPINE BESYLATE 5 MG PO TABS
5.0000 mg | ORAL_TABLET | Freq: Every day | ORAL | 1 refills | Status: DC
Start: 2023-11-16 — End: 2024-05-25
  Filled 2023-11-16 – 2024-02-08 (×2): qty 90, 90d supply, fill #0
  Filled 2024-05-09: qty 90, 90d supply, fill #1

## 2023-11-16 NOTE — Progress Notes (Signed)
 Assessment & Plan:  Kristy Flores was seen today for medical management of chronic issues.  Diagnoses and all orders for this visit:  Controlled type 2 diabetes mellitus with hyperglycemia, with long-term current use of insulin  (HCC) -     POCT glycosylated hemoglobin (Hb A1C) -     Insulin  Glargine (BASAGLAR  KWIKPEN) 100 UNIT/ML; Inject 15 Units into the skin at bedtime.  Primary hypertension -     amLODipine  (NORVASC ) 10 MG tablet; Take 1 tablet (10 mg total) by mouth daily.    Patient has been counseled on age-appropriate routine health concerns for screening and prevention. These are reviewed and up-to-date. Referrals have been placed accordingly. Immunizations are up-to-date or declined.    Subjective:   Chief Complaint  Patient presents with   Medical Management of Chronic Issues    Kristy Flores 68 y.o. female presents to office today for follow up to HTN and DM.   She has a past medical history of Depression, DM2, CKD stage 3b, Hyperlipidemia, Hypertension, Insomnia, and Restless leg syndrome    Patient has been counseled on age-appropriate routine health concerns for screening and prevention. These are reviewed and up-to-date. Referrals have been placed accordingly. Immunizations are up-to-date or declined. MAMMOGRAM: Declines PAP SMEAR: Declines COLON CANCER SCREEN: Declines      HTN Blood pressure is well-controlled with amlodipine  5 mg daily and carvedilol  6.25 mg twice daily.  She was taken off Lasix  and lisinopril  by her nephrologist. BP Readings from Last 3 Encounters:  11/16/23 126/80  08/18/23 (!) 146/78  07/21/23 (!) 159/84    Lab Results  Component Value Date   HGBA1C 6.5 11/16/2023     She has an open wound on her left lower leg after being hit by a yard tiller. She is currently on day 5 of clindamycin  which was prescribed at the urgent care on November 11, 2023.  At that time she also had sutures removed from the wound. She does report the circumference  of erythema has decreased significantly. The drainage from the area is serosanguinous. She is UTD on tetanus.   Review of Systems  Constitutional:  Negative for fever, malaise/fatigue and weight loss.  HENT: Negative.  Negative for nosebleeds.   Eyes: Negative.  Negative for blurred vision, double vision and photophobia.  Respiratory: Negative.  Negative for cough and shortness of breath.   Cardiovascular: Negative.  Negative for chest pain, palpitations and leg swelling.  Gastrointestinal: Negative.  Negative for heartburn, nausea and vomiting.  Musculoskeletal: Negative.  Negative for myalgias.  Skin:        See HPI  Neurological: Negative.  Negative for dizziness, focal weakness, seizures and headaches.  Psychiatric/Behavioral: Negative.  Negative for suicidal ideas.     Past Medical History:  Diagnosis Date   Anemia    Anxiety    Chronic kidney disease    stage III   Depression    Diabetes mellitus without complication (HCC)    Headache    Hyperlipidemia    Hypertension    Insomnia    Melanoma (HCC)    Back   Pneumonia    Restless leg syndrome     Past Surgical History:  Procedure Laterality Date   ABDOMINAL HYSTERECTOMY     CESAREAN SECTION     CHOLECYSTECTOMY N/A 04/25/2022   Procedure: LAPAROSCOPIC CHOLECYSTECTOMY WITH ICG DYE;  Surgeon: Aldean Hummingbird, MD;  Location: [facility];  Service: General;  Laterality: N/A;   ERCP N/A 02/13/2022   Procedure: ENDOSCOPIC RETROGRADE CHOLANGIOPANCREATOGRAPHY (  ERCP);  Surgeon: Janel Medford, MD;  Location: [facility];  Service: Gastroenterology;  Laterality: N/A;   INCISIONAL HERNIA REPAIR N/A 02/24/2023   Procedure: LAPAROSCOPIC INCISIONAL HERNIA WITH MESH;  Surgeon: Aldean Hummingbird, MD;  Location: [facility];  Service: General;  Laterality: N/A;   MELANOMA EXCISION  1985   REMOVAL OF STONES  02/13/2022   Procedure: REMOVAL OF STONES;  Surgeon: Janel Medford, MD;  Location: [facility];  Service: Gastroenterology;;   [address]   02/13/2022   Procedure: [address];  Surgeon: Janel Medford, MD;  Location: [facility];  Service: Gastroenterology;;   UPPER GI ENDOSCOPY      Family History  Problem Relation Age of Onset   Kidney disease Mother    Diabetes Mother    Hypertension Mother    Cancer Father     Social History Reviewed with no changes to be made today.   Outpatient Medications Prior to Visit  Medication Sig Dispense Refill   allopurinol  (ZYLOPRIM ) 100 MG tablet Take 2 tablets (200 mg total) by mouth daily. 180 tablet 1   Blood Glucose Monitoring Suppl (ONETOUCH VERIO FLEX SYSTEM) w/Device KIT Use as directed daily. 1 kit 0   Blood Pressure Monitor DEVI Please provide patient with insurance approved blood pressure monitor. I10.0 1 each 0   carvedilol  (COREG ) 6.25 MG tablet Take 1 tablet (6.25 mg total) by mouth 2 (two) times daily with a meal. 180 tablet 1   cholecalciferol (VITAMIN D3) 25 MCG (1000 UNIT) tablet Take 1,000 Units by mouth daily.     clindamycin  (CLEOCIN ) 300 MG capsule Take 1 capsule (300 mg total) by mouth 3 (three) times daily for 10 days 30 capsule 0   dapagliflozin  propanediol (FARXIGA ) 10 MG TABS tablet Take 1 tablet (10 mg total) by mouth daily. 30 tablet 11   DULoxetine  (CYMBALTA ) 30 MG capsule Take 1 capsule (30 mg total) by mouth 2 (two) times daily. 180 capsule 1   glimepiride  (AMARYL ) 4 MG tablet Take 1 tablet (4 mg total) by mouth daily before breakfast. 90 tablet 1   Glucose Blood (BLOOD GLUCOSE TEST STRIPS) STRP Use as directed daily. 100 strip 1   Insulin  Pen Needle (B-D ULTRAFINE III SHORT PEN) 31G X 8 MM MISC USE AS INSTRUCTED. INJECT INTO THE SKIN ONCE NIGHTLY. 100 each 2   Lancet Device MISC use daily. 1 each 0   Lancets (ONETOUCH DELICA PLUS LANCET33G) MISC Use as directed daily. 100 each 1   lovastatin  (MEVACOR ) 40 MG tablet Take 1 tablet (40 mg total) by mouth at bedtime. 90 tablet 1   mupirocin  ointment (BACTROBAN ) 2 % Apply 1 Application topically 2 (two)  times daily. 22 g 0   sodium bicarbonate  650 MG tablet Take 1 tablet (650 mg total) by mouth 2 (two) times daily. 60 tablet 11   triamcinolone  (KENALOG ) 0.025 % ointment Apply to rash on hand 2 (two) times daily. 60 g 1   amLODipine  (NORVASC ) 5 MG tablet Take 1 tablet (5 mg total) by mouth daily. 90 tablet 1   Insulin  Glargine (BASAGLAR  KWIKPEN) 100 UNIT/ML Inject 15 Units into the skin at bedtime. 15 mL 3   furosemide  (LASIX ) 20 MG tablet Take 1 tablet (20 mg total) by mouth every other day. (Patient not taking: Reported on 11/16/2023) 45 tablet 1   No facility-administered medications prior to visit.    Allergies  Allergen Reactions   Chlorzoxazone Nausea And Vomiting   Floxuridine     Hypoglycemia / pass out  Codeine Nausea And Vomiting   Gabapentin Swelling    AKI   Nsaids Other (See Comments)    CKD   Penicillins     Broke out, stomach upset.    Victoza [Liraglutide]     Shut down kidneys        Objective:    BP 126/80 (BP Location: Left Arm, Patient Position: Sitting, Cuff Size: Normal)   Pulse 61   Resp 19   Ht 5\' 1"  (1.549 m)   Wt 219 lb 6.4 oz (99.5 kg)   SpO2 100%   BMI 41.46 kg/m  Wt Readings from Last 3 Encounters:  11/16/23 219 lb 6.4 oz (99.5 kg)  08/18/23 219 lb 12.8 oz (99.7 kg)  07/21/23 223 lb 9.6 oz (101.4 kg)    Physical Exam Vitals and nursing note reviewed.  Constitutional:      Appearance: She is well-developed.  HENT:     Head: Normocephalic and atraumatic.  Cardiovascular:     Rate and Rhythm: Normal rate and regular rhythm.     Heart sounds: Normal heart sounds. No murmur heard.    No friction rub. No gallop.  Pulmonary:     Effort: Pulmonary effort is normal. No tachypnea or respiratory distress.     Breath sounds: Normal breath sounds. No decreased breath sounds, wheezing, rhonchi or rales.  Chest:     Chest wall: No tenderness.  Abdominal:     General: Bowel sounds are normal.     Palpations: Abdomen is soft.  Musculoskeletal:         General: Normal range of motion.     Cervical back: Normal range of motion.  Skin:    General: Skin is warm and dry.     Comments: See PHOTO  Neurological:     Mental Status: She is alert and oriented to person, place, and time.     Coordination: Coordination normal.  Psychiatric:        Behavior: Behavior normal. Behavior is cooperative.        Thought Content: Thought content normal.        Judgment: Judgment normal.          Patient has been counseled extensively about nutrition and exercise as well as the importance of adherence with medications and regular follow-up. The patient was given clear instructions to go to ER or return to medical center if symptoms don't improve, worsen or new problems develop. The patient verbalized understanding.   Follow-up: Return in about 9 days (around 11/25/2023) for BP recheck. wound check.   Collins Dean, FNP-BC [facility] [place], [place] [phone]   11/16/2023, 1:31 PM

## 2023-11-16 NOTE — Telephone Encounter (Signed)
 FYI Copied from CRM [id number]. Topic: General - Other  >> Nov 16, 2023  4:18 PM Santiya F wrote: Reason for CRM: Patient is calling in returning a call from [facility]. Please follow up with patient.

## 2023-11-16 NOTE — Progress Notes (Signed)
 Unable to reach patient by phone to relay response from provider. Please relay message from Ms. Kristy Flores if patient returns call.

## 2023-11-17 ENCOUNTER — Other Ambulatory Visit: Payer: Self-pay

## 2023-11-17 NOTE — Telephone Encounter (Signed)
 Patient identified by name and date of birth.  Patient aware of providers correction to blood pressure medication and voiced understanding.

## 2023-11-25 ENCOUNTER — Other Ambulatory Visit: Payer: Self-pay

## 2023-11-25 ENCOUNTER — Encounter: Payer: Self-pay | Admitting: Nurse Practitioner

## 2023-11-25 ENCOUNTER — Ambulatory Visit: Attending: Nurse Practitioner | Admitting: Nurse Practitioner

## 2023-11-25 ENCOUNTER — Ambulatory Visit
Admission: RE | Admit: 2023-11-25 | Discharge: 2023-11-25 | Disposition: A | Discharge status: Home / Self Care | Source: Ambulatory Visit | Attending: Nurse Practitioner | Admitting: Nurse Practitioner

## 2023-11-25 VITALS — BP 116/68 | HR 70 | Resp 20 | Ht 61.0 in | Wt 219.0 lb

## 2023-11-25 DIAGNOSIS — S81802A Unspecified open wound, left lower leg, initial encounter: Secondary | ICD-10-CM | POA: Diagnosis not present

## 2023-11-25 DIAGNOSIS — L03116 Cellulitis of left lower limb: Secondary | ICD-10-CM | POA: Diagnosis not present

## 2023-11-25 DIAGNOSIS — R600 Localized edema: Secondary | ICD-10-CM | POA: Diagnosis not present

## 2023-11-25 MED ORDER — MUPIROCIN 2 % EX OINT
1.0000 | TOPICAL_OINTMENT | Freq: Two times a day (BID) | CUTANEOUS | 0 refills | Status: DC
  Filled 2023-11-25: qty 22, 30d supply, fill #0

## 2023-11-25 MED ORDER — SULFAMETHOXAZOLE-TRIMETHOPRIM 800-160 MG PO TABS
1.0000 | ORAL_TABLET | Freq: Two times a day (BID) | ORAL | 0 refills | Status: DC
  Filled 2023-11-25: qty 14, 7d supply, fill #0

## 2023-11-25 NOTE — Progress Notes (Signed)
 Assessment & Plan:  Kristy Flores was seen today for blood pressure check.  Diagnoses and all orders for this visit:  Cellulitis of left lower extremity -     DG Tibia/Fibula Left; Future -     sulfamethoxazole -trimethoprim  (BACTRIM  DS) 800-160 MG tablet; Take 1 tablet by mouth 2 (two) times daily. -     mupirocin  ointment (BACTROBAN ) 2 %; Apply 1 Application topically 2 (two) times daily. -     CBC with Differential    Patient has been counseled on age-appropriate routine health concerns for screening and prevention. These are reviewed and up-to-date. Referrals have been placed accordingly. Immunizations are up-to-date or declined.    Subjective:   Chief Complaint  Patient presents with   Blood Pressure Check    Kristy Flores 68 y.o. female presents to office today for follow up to left leg wound and blood pressure recheck.  Initially Kristy Flores was seen at the urgent care on 11/05/2023 for left leg laceration/wound sustained from working in the garden and having an Marketing executive run into the front of her leg.  She required laceration repair with sutures and was given a tetanus booster but was not given any antibiotics at that time.  On 423 she returned to the urgent care with complaints of infection in the site of the laceration.  At that time sutures were removed and she was started on clindamycin  for 10 days.  He saw me 5 days later on April 28 for diabetes follow-up and at that time a photo was taken of her left lower leg.  I instructed her to follow back up with me after antibiotics were complete.   Today there is still some erythema surrounding the laceration and she reports serosanguineous/brown drainage from the area with intermittent swelling.  The area is warm to touch.      Blood pressure is well-controlled today. Review of Systems  Constitutional:  Negative for fever, malaise/fatigue and weight loss.  HENT: Negative.  Negative for nosebleeds.   Eyes: Negative.   Negative for blurred vision, double vision and photophobia.  Respiratory: Negative.  Negative for cough and shortness of breath.   Cardiovascular: Negative.  Negative for chest pain, palpitations and leg swelling.  Gastrointestinal: Negative.  Negative for heartburn, nausea and vomiting.  Musculoskeletal: Negative.  Negative for myalgias.  Skin:        See photo  Neurological: Negative.  Negative for dizziness, focal weakness, seizures and headaches.  Psychiatric/Behavioral: Negative.  Negative for suicidal ideas.     Past Medical History:  Diagnosis Date   Anemia    Anxiety    Chronic kidney disease    stage III   Depression    Diabetes mellitus without complication (HCC)    Headache    Hyperlipidemia    Hypertension    Insomnia    Melanoma (HCC)    Back   Pneumonia    Restless leg syndrome     Past Surgical History:  Procedure Laterality Date   ABDOMINAL HYSTERECTOMY     CESAREAN SECTION     CHOLECYSTECTOMY N/A 04/25/2022   Procedure: LAPAROSCOPIC CHOLECYSTECTOMY WITH ICG DYE;  Surgeon: Aldean Hummingbird, MD;  Location: [facility];  Service: General;  Laterality: N/A;   ERCP N/A 02/13/2022   Procedure: ENDOSCOPIC RETROGRADE CHOLANGIOPANCREATOGRAPHY (ERCP);  Surgeon: Janel Medford, MD;  Location: [facility];  Service: Gastroenterology;  Laterality: N/A;   INCISIONAL HERNIA REPAIR N/A 02/24/2023   Procedure: LAPAROSCOPIC INCISIONAL HERNIA WITH MESH;  Surgeon: Elvan Hamel,  Lyell Samuel, MD;  Location: [facility];  Service: General;  Laterality: N/A;   MELANOMA EXCISION  1985   REMOVAL OF STONES  02/13/2022   Procedure: REMOVAL OF STONES;  Surgeon: Janel Medford, MD;  Location: [facility];  Service: Gastroenterology;;   [address]  02/13/2022   Procedure: [address];  Surgeon: Janel Medford, MD;  Location: [facility];  Service: Gastroenterology;;   UPPER GI ENDOSCOPY      Family History  Problem Relation Age of Onset   Kidney disease Mother    Diabetes Mother     Hypertension Mother    Cancer Father     Social History Reviewed with no changes to be made today.   Outpatient Medications Prior to Visit  Medication Sig Dispense Refill   allopurinol  (ZYLOPRIM ) 100 MG tablet Take 2 tablets (200 mg total) by mouth daily. 180 tablet 1   amLODipine  (NORVASC ) 5 MG tablet Take 1 tablet (5 mg total) by mouth daily. 90 tablet 1   Blood Glucose Monitoring Suppl (ONETOUCH VERIO FLEX SYSTEM) w/Device KIT Use as directed daily. 1 kit 0   Blood Pressure Monitor DEVI Please provide patient with insurance approved blood pressure monitor. I10.0 1 each 0   carvedilol  (COREG ) 6.25 MG tablet Take 1 tablet (6.25 mg total) by mouth 2 (two) times daily with a meal. 180 tablet 1   cholecalciferol (VITAMIN D3) 25 MCG (1000 UNIT) tablet Take 1,000 Units by mouth daily.     clindamycin  (CLEOCIN ) 300 MG capsule Take 1 capsule (300 mg total) by mouth 3 (three) times daily for 10 days 30 capsule 0   dapagliflozin  propanediol (FARXIGA ) 10 MG TABS tablet Take 1 tablet (10 mg total) by mouth daily. 30 tablet 11   DULoxetine  (CYMBALTA ) 30 MG capsule Take 1 capsule (30 mg total) by mouth 2 (two) times daily. 180 capsule 1   glimepiride  (AMARYL ) 4 MG tablet Take 1 tablet (4 mg total) by mouth daily before breakfast. 90 tablet 1   Glucose Blood (BLOOD GLUCOSE TEST STRIPS) STRP Use as directed daily. 100 strip 1   Insulin  Glargine (BASAGLAR  KWIKPEN) 100 UNIT/ML Inject 15 Units into the skin at bedtime. 15 mL 3   Insulin  Pen Needle (B-D ULTRAFINE III SHORT PEN) 31G X 8 MM MISC USE AS INSTRUCTED. INJECT INTO THE SKIN ONCE NIGHTLY. 100 each 2   Lancet Device MISC use daily. 1 each 0   Lancets (ONETOUCH DELICA PLUS LANCET33G) MISC Use as directed daily. 100 each 1   lovastatin  (MEVACOR ) 40 MG tablet Take 1 tablet (40 mg total) by mouth at bedtime. 90 tablet 1   sodium bicarbonate  650 MG tablet Take 1 tablet (650 mg total) by mouth 2 (two) times daily. 60 tablet 11   triamcinolone  (KENALOG ) 0.025  % ointment Apply to rash on hand 2 (two) times daily. 60 g 1   mupirocin  ointment (BACTROBAN ) 2 % Apply 1 Application topically 2 (two) times daily. 22 g 0   No facility-administered medications prior to visit.    Allergies  Allergen Reactions   Chlorzoxazone Nausea And Vomiting   Floxuridine     Hypoglycemia / pass out   Codeine Nausea And Vomiting   Gabapentin Swelling    AKI   Nsaids Other (See Comments)    CKD   Penicillins     Broke out, stomach upset.    Victoza [Liraglutide]     Shut down kidneys        Objective:    BP 116/68 (BP Location:  Left Arm, Patient Position: Sitting, Cuff Size: Normal)   Pulse 70   Resp 20   Ht 5\' 1"  (1.549 m)   Wt 219 lb (99.3 kg)   SpO2 100%   BMI 41.38 kg/m  Wt Readings from Last 3 Encounters:  11/25/23 219 lb (99.3 kg)  11/16/23 219 lb 6.4 oz (99.5 kg)  08/18/23 219 lb 12.8 oz (99.7 kg)    Physical Exam Vitals and nursing note reviewed.  Constitutional:      Appearance: She is well-developed.  HENT:     Head: Normocephalic and atraumatic.  Cardiovascular:     Rate and Rhythm: Normal rate and regular rhythm.     Heart sounds: Normal heart sounds. No murmur heard.    No friction rub. No gallop.  Pulmonary:     Effort: Pulmonary effort is normal. No tachypnea or respiratory distress.     Breath sounds: Normal breath sounds. No decreased breath sounds, wheezing, rhonchi or rales.  Chest:     Chest wall: No tenderness.  Musculoskeletal:        General: Normal range of motion.     Cervical back: Normal range of motion.  Skin:    General: Skin is warm and dry.     Comments: See photo  Neurological:     Mental Status: She is alert and oriented to person, place, and time.     Coordination: Coordination normal.  Psychiatric:        Behavior: Behavior normal. Behavior is cooperative.        Thought Content: Thought content normal.        Judgment: Judgment normal.          Patient has been counseled extensively about  nutrition and exercise as well as the importance of adherence with medications and regular follow-up. The patient was given clear instructions to go to ER or return to medical center if symptoms don't improve, worsen or new problems develop. The patient verbalized understanding.   Follow-up: Return if symptoms worsen or fail to improve.   Collins Dean, FNP-BC [facility] [place], [place] [phone]   11/25/2023, 11:28 AM

## 2023-11-25 NOTE — Patient Instructions (Signed)
 [facility] [address]  [phone]

## 2023-11-26 LAB — CBC WITH DIFFERENTIAL/PLATELET
Basophils Absolute: 0.1 10*3/uL (ref 0.0–0.2)
Basos: 1 %
EOS (ABSOLUTE): 0.3 10*3/uL (ref 0.0–0.4)
Eos: 3 %
Hematocrit: 40.3 % (ref 34.0–46.6)
Hemoglobin: 12.9 g/dL (ref 11.1–15.9)
Immature Grans (Abs): 0 10*3/uL (ref 0.0–0.1)
Immature Granulocytes: 0 %
Lymphocytes Absolute: 1.9 10*3/uL (ref 0.7–3.1)
Lymphs: 19 %
MCH: 27.6 pg (ref 26.6–33.0)
MCHC: 32 g/dL (ref 31.5–35.7)
MCV: 86 fL (ref 79–97)
Monocytes Absolute: 0.8 10*3/uL (ref 0.1–0.9)
Monocytes: 8 %
Neutrophils Absolute: 7 10*3/uL (ref 1.4–7.0)
Neutrophils: 69 %
Platelets: 310 10*3/uL (ref 150–450)
RBC: 4.68 x10E6/uL (ref 3.77–5.28)
RDW: 14.4 % (ref 11.7–15.4)
WBC: 10.1 10*3/uL (ref 3.4–10.8)

## 2023-11-30 ENCOUNTER — Other Ambulatory Visit: Payer: Self-pay

## 2023-12-01 ENCOUNTER — Other Ambulatory Visit: Payer: Self-pay

## 2023-12-03 ENCOUNTER — Ambulatory Visit: Payer: Self-pay | Admitting: Nurse Practitioner

## 2023-12-10 ENCOUNTER — Encounter: Payer: Self-pay | Admitting: Nurse Practitioner

## 2023-12-11 ENCOUNTER — Other Ambulatory Visit: Payer: Self-pay | Admitting: Nurse Practitioner

## 2023-12-11 ENCOUNTER — Other Ambulatory Visit: Payer: Self-pay

## 2023-12-11 DIAGNOSIS — L03116 Cellulitis of left lower limb: Secondary | ICD-10-CM

## 2023-12-11 MED ORDER — MUPIROCIN 2 % EX OINT
1.0000 | TOPICAL_OINTMENT | Freq: Two times a day (BID) | CUTANEOUS | 1 refills | Status: AC
Start: 2023-12-11 — End: ?
  Filled 2023-12-11: qty 66, 33d supply, fill #0
  Filled 2024-02-12: qty 44, 44d supply, fill #0
  Filled 2024-03-29: qty 44, 44d supply, fill #1

## 2023-12-25 ENCOUNTER — Other Ambulatory Visit: Payer: Self-pay

## 2023-12-25 DIAGNOSIS — N1832 Chronic kidney disease, stage 3b: Secondary | ICD-10-CM | POA: Diagnosis not present

## 2024-01-04 ENCOUNTER — Other Ambulatory Visit: Payer: Self-pay

## 2024-01-05 ENCOUNTER — Other Ambulatory Visit: Payer: Self-pay

## 2024-01-11 ENCOUNTER — Other Ambulatory Visit: Payer: Self-pay | Admitting: Nurse Practitioner

## 2024-01-11 ENCOUNTER — Other Ambulatory Visit: Payer: Self-pay

## 2024-01-11 DIAGNOSIS — I10 Essential (primary) hypertension: Secondary | ICD-10-CM

## 2024-01-11 MED ORDER — CARVEDILOL 6.25 MG PO TABS
6.2500 mg | ORAL_TABLET | Freq: Two times a day (BID) | ORAL | 1 refills | Status: DC
  Filled 2024-01-11: qty 180, 90d supply, fill #0
  Filled 2024-04-11: qty 180, 90d supply, fill #1

## 2024-01-15 ENCOUNTER — Other Ambulatory Visit: Payer: Self-pay

## 2024-01-18 ENCOUNTER — Other Ambulatory Visit: Payer: Self-pay

## 2024-02-04 ENCOUNTER — Other Ambulatory Visit: Payer: Self-pay

## 2024-02-04 MED ORDER — SODIUM BICARBONATE 650 MG PO TABS
650.0000 mg | ORAL_TABLET | Freq: Two times a day (BID) | ORAL | 11 refills | Status: AC
  Filled 2024-02-04: qty 60, 30d supply, fill #0
  Filled 2024-02-12 – 2024-02-29 (×2): qty 60, 30d supply, fill #1
  Filled 2024-03-30 – 2024-03-31 (×4): qty 60, 30d supply, fill #2
  Filled 2024-04-29: qty 60, 30d supply, fill #3
  Filled 2024-06-01: qty 60, 30d supply, fill #4
  Filled 2024-07-01: qty 60, 30d supply, fill #5
  Filled 2024-08-01: qty 60, 30d supply, fill #6

## 2024-02-08 ENCOUNTER — Other Ambulatory Visit: Payer: Self-pay

## 2024-02-09 ENCOUNTER — Other Ambulatory Visit: Payer: Self-pay

## 2024-02-12 ENCOUNTER — Other Ambulatory Visit: Payer: Self-pay

## 2024-02-15 ENCOUNTER — Other Ambulatory Visit: Payer: Self-pay

## 2024-02-29 ENCOUNTER — Other Ambulatory Visit: Payer: Self-pay

## 2024-02-29 ENCOUNTER — Other Ambulatory Visit: Payer: Self-pay | Admitting: Nurse Practitioner

## 2024-02-29 DIAGNOSIS — Z794 Long term (current) use of insulin: Secondary | ICD-10-CM

## 2024-02-29 DIAGNOSIS — E1165 Type 2 diabetes mellitus with hyperglycemia: Secondary | ICD-10-CM

## 2024-03-01 ENCOUNTER — Other Ambulatory Visit: Payer: Self-pay

## 2024-03-01 MED ORDER — INSULIN PEN NEEDLE 31G X 8 MM MISC
2 refills | Status: AC
  Filled 2024-03-01: qty 100, 30d supply, fill #0
  Filled 2024-03-29: qty 100, 30d supply, fill #1
  Filled 2024-04-29: qty 100, 30d supply, fill #2

## 2024-03-02 ENCOUNTER — Other Ambulatory Visit: Payer: Self-pay

## 2024-03-04 ENCOUNTER — Other Ambulatory Visit (HOSPITAL_COMMUNITY): Payer: Self-pay

## 2024-03-04 ENCOUNTER — Other Ambulatory Visit: Payer: Self-pay

## 2024-03-04 DIAGNOSIS — N2581 Secondary hyperparathyroidism of renal origin: Secondary | ICD-10-CM | POA: Diagnosis not present

## 2024-03-04 DIAGNOSIS — R809 Proteinuria, unspecified: Secondary | ICD-10-CM | POA: Diagnosis not present

## 2024-03-04 DIAGNOSIS — E875 Hyperkalemia: Secondary | ICD-10-CM | POA: Diagnosis not present

## 2024-03-04 DIAGNOSIS — N1832 Chronic kidney disease, stage 3b: Secondary | ICD-10-CM | POA: Diagnosis not present

## 2024-03-04 DIAGNOSIS — E872 Acidosis, unspecified: Secondary | ICD-10-CM | POA: Diagnosis not present

## 2024-03-04 DIAGNOSIS — I129 Hypertensive chronic kidney disease with stage 1 through stage 4 chronic kidney disease, or unspecified chronic kidney disease: Secondary | ICD-10-CM | POA: Diagnosis not present

## 2024-03-04 DIAGNOSIS — E1122 Type 2 diabetes mellitus with diabetic chronic kidney disease: Secondary | ICD-10-CM | POA: Diagnosis not present

## 2024-03-04 MED ORDER — LOSARTAN POTASSIUM 25 MG PO TABS
25.0000 mg | ORAL_TABLET | Freq: Every day | ORAL | 3 refills | Status: AC
  Filled 2024-03-04: qty 90, 90d supply, fill #0
  Filled 2024-05-30: qty 90, 90d supply, fill #1

## 2024-03-07 ENCOUNTER — Other Ambulatory Visit: Payer: Self-pay

## 2024-03-18 ENCOUNTER — Other Ambulatory Visit: Payer: Self-pay

## 2024-03-24 ENCOUNTER — Other Ambulatory Visit: Payer: Self-pay

## 2024-03-24 DIAGNOSIS — S20222A Contusion of left back wall of thorax, initial encounter: Secondary | ICD-10-CM | POA: Diagnosis not present

## 2024-03-24 DIAGNOSIS — S20221A Contusion of right back wall of thorax, initial encounter: Secondary | ICD-10-CM | POA: Diagnosis not present

## 2024-03-24 DIAGNOSIS — M545 Low back pain, unspecified: Secondary | ICD-10-CM | POA: Diagnosis not present

## 2024-03-24 DIAGNOSIS — S43402A Unspecified sprain of left shoulder joint, initial encounter: Secondary | ICD-10-CM | POA: Diagnosis not present

## 2024-03-24 DIAGNOSIS — E119 Type 2 diabetes mellitus without complications: Secondary | ICD-10-CM | POA: Diagnosis not present

## 2024-03-24 DIAGNOSIS — M25512 Pain in left shoulder: Secondary | ICD-10-CM | POA: Diagnosis not present

## 2024-03-24 DIAGNOSIS — H25813 Combined forms of age-related cataract, bilateral: Secondary | ICD-10-CM | POA: Diagnosis not present

## 2024-03-24 LAB — HM DIABETES EYE EXAM

## 2024-03-24 MED ORDER — OXYCODONE-ACETAMINOPHEN 5-325 MG PO TABS
1.0000 | ORAL_TABLET | ORAL | 0 refills | Status: DC
  Filled 2024-03-24: qty 8, 2d supply, fill #0

## 2024-03-29 ENCOUNTER — Other Ambulatory Visit: Payer: Self-pay

## 2024-03-30 ENCOUNTER — Other Ambulatory Visit: Payer: Self-pay

## 2024-03-31 ENCOUNTER — Other Ambulatory Visit: Payer: Self-pay

## 2024-04-11 ENCOUNTER — Other Ambulatory Visit: Payer: Self-pay | Admitting: Nurse Practitioner

## 2024-04-11 ENCOUNTER — Other Ambulatory Visit: Payer: Self-pay

## 2024-04-11 DIAGNOSIS — E1165 Type 2 diabetes mellitus with hyperglycemia: Secondary | ICD-10-CM

## 2024-04-11 DIAGNOSIS — E785 Hyperlipidemia, unspecified: Secondary | ICD-10-CM

## 2024-04-11 DIAGNOSIS — G894 Chronic pain syndrome: Secondary | ICD-10-CM

## 2024-04-11 DIAGNOSIS — M1A09X0 Idiopathic chronic gout, multiple sites, without tophus (tophi): Secondary | ICD-10-CM

## 2024-04-12 ENCOUNTER — Other Ambulatory Visit: Payer: Self-pay

## 2024-04-12 MED ORDER — ALLOPURINOL 100 MG PO TABS
200.0000 mg | ORAL_TABLET | Freq: Every day | ORAL | 0 refills | Status: DC
  Filled 2024-04-12: qty 180, 90d supply, fill #0

## 2024-04-12 MED ORDER — DULOXETINE HCL 30 MG PO CPEP
30.0000 mg | ORAL_CAPSULE | Freq: Two times a day (BID) | ORAL | 0 refills | Status: DC
  Filled 2024-04-12: qty 180, 90d supply, fill #0

## 2024-04-12 MED ORDER — LOVASTATIN 40 MG PO TABS
40.0000 mg | ORAL_TABLET | Freq: Every day | ORAL | 0 refills | Status: DC
  Filled 2024-04-12: qty 90, 90d supply, fill #0

## 2024-04-12 NOTE — Telephone Encounter (Signed)
 Requested Prescriptions  Pending Prescriptions Disp Refills   allopurinol  (ZYLOPRIM ) 100 MG tablet 180 tablet 1    Sig: Take 2 tablets (200 mg total) by mouth daily.     Endocrinology:  Gout Agents - allopurinol  Failed - 04/12/2024  9:56 AM      Failed - Uric Acid in normal range and within 360 days    Uric Acid  Date Value Ref Range Status  07/15/2022 4.5 3.0 - 7.2 mg/dL Final    Comment:               Therapeutic target for gout patients: <6.0         Passed - Cr in normal range and within 360 days    Creatinine, Ser  Date Value Ref Range Status  07/21/2023 1.23 (H) 0.57 - 1.00 mg/dL Final   Creatinine, POC  Date Value Ref Range Status  09/08/2023 76.1 mg/dL Final    Comment:    Abstracted by HIM         Passed - Valid encounter within last 12 months    Recent Outpatient Visits           4 months ago Cellulitis of left lower extremity   [place] [facility] Dept Of [place]. [facility] [place], [place] W, NP   4 months ago Controlled type 2 diabetes mellitus with hyperglycemia, with long-term current use of insulin  [facility])   [place] [facility] - A Dept Of [place]. [facility] Theotis Haze ORN, NP   7 months ago Encounter for [employer] annual wellness exam   [place] [facility] - A Dept Of [place]. [facility] Theotis Haze ORN, NP   8 months ago Primary hypertension   [place] [facility] [place] - A Dept Of [place]. [facility] Theotis Haze ORN, NP   12 months ago Dyshidrotic eczema   [place] [facility] [place] - A Dept Of [place]. [facility] [place], Haze ORN, NP              Passed - CBC within normal limits and completed in the last 12 months    WBC  Date Value Ref Range Status  11/25/2023 10.1 3.4 - 10.8 x10E3/uL Final  02/25/2023 10.3 4.0 - 10.5 K/uL Final   RBC  Date Value Ref Range Status  11/25/2023 4.68 3.77 - 5.28 x10E6/uL Final  02/25/2023 3.79 (L) 3.87  - 5.11 MIL/uL Final   Hemoglobin  Date Value Ref Range Status  11/25/2023 12.9 11.1 - 15.9 g/dL Final   Hematocrit  Date Value Ref Range Status  11/25/2023 40.3 34.0 - 46.6 % Final   MCHC  Date Value Ref Range Status  11/25/2023 32.0 31.5 - 35.7 g/dL Final  91/92/7975 69.0 30.0 - 36.0 g/dL Final   [facility]  Date Value Ref Range Status  11/25/2023 27.6 26.6 - 33.0 pg Final  02/25/2023 29.6 26.0 - 34.0 pg Final   MCV  Date Value Ref Range Status  11/25/2023 86 79 - 97 fL Final   No results found for: PLTCOUNTKUC, LABPLAT, POCPLA RDW  Date Value Ref Range Status  11/25/2023 14.4 11.7 - 15.4 % Final          lovastatin  (MEVACOR ) 40 MG tablet 90 tablet 1    Sig: Take 1 tablet (40 mg total) by mouth at bedtime.     Cardiovascular:  Antilipid - Statins 2 Failed - 04/12/2024  9:56 AM      Failed - Lipid Panel in normal range within the last 12 months    Cholesterol, Total  Date Value Ref Range Status  01/12/2023 193 100 - 199 mg/dL Final   LDL Chol Calc (NIH)  Date Value Ref Range Status  01/12/2023 120 (H) 0 - 99 mg/dL Final   HDL  Date Value Ref Range Status  01/12/2023 45 >39 mg/dL Final   Triglycerides  Date Value Ref Range Status  01/12/2023 158 (H) 0 - 149 mg/dL Final         Passed - Cr in normal range and within 360 days    Creatinine, Ser  Date Value Ref Range Status  07/21/2023 1.23 (H) 0.57 - 1.00 mg/dL Final   Creatinine, POC  Date Value Ref Range Status  09/08/2023 76.1 mg/dL Final    Comment:    Abstracted by HIM         Passed - Patient is not pregnant      Passed - Valid encounter within last 12 months    Recent Outpatient Visits           4 months ago Cellulitis of left lower extremity   [place] [facility] - [facility] Dept Of [place]. [facility] [place], [place] W, NP   4 months ago Controlled type 2 diabetes mellitus with hyperglycemia, with long-term current use of insulin  [facility])   [place] [facility] -  A Dept Of [place]. [facility] Theotis Haze ORN, NP   7 months ago Encounter for [employer] annual wellness exam   [place] [facility] [place] - A Dept Of [place]. [facility] Theotis Haze ORN, NP   8 months ago Primary hypertension   [place] [facility] [place] - A Dept Of [place]. [facility] Theotis Haze ORN, NP   12 months ago Dyshidrotic eczema    [facility] [place] - A Dept Of [place]. [facility] [place], [place] W, NP               DULoxetine  (CYMBALTA ) 30 MG capsule 180 capsule 0    Sig: Take 1 capsule (30 mg total) by mouth 2 (two) times daily.     Psychiatry: Antidepressants - SNRI - duloxetine  Passed - 04/12/2024  9:56 AM      Passed - Cr in normal range and within 360 days    Creatinine, Ser  Date Value Ref Range Status  07/21/2023 1.23 (H) 0.57 - 1.00 mg/dL Final   Creatinine, POC  Date Value Ref Range Status  09/08/2023 76.1 mg/dL Final    Comment:    Abstracted by HIM         Passed - eGFR is 30 or above and within 360 days    GFR calc Af Amer  Date Value Ref Range Status  09/12/2020 39 (L) >59 mL/min/1.73 Final    Comment:    **In accordance with recommendations from the NKF-ASN Task force,**   [facility] is in the process of updating its eGFR calculation to the   2021 CKD-EPI creatinine equation that estimates kidney function   without a race variable.    GFR, Estimated  Date Value Ref Range Status  02/25/2023 35 (L) >60 mL/min Final    Comment:    (NOTE) Calculated using the CKD-EPI Creatinine Equation (2021)    GFR  Date Value Ref Range Status  12/10/2021 37.23 (L) >60.00 mL/min Final  Comment:    Calculated using the CKD-EPI Creatinine Equation (2021)   eGFR  Date Value Ref Range Status  07/21/2023 49 (L) >59 mL/min/1.73 Final   EGFR  Date Value Ref Range Status  09/08/2023 36.0  Final    Comment:    Abstracted by HIM         Passed - Completed PHQ-2 or PHQ-9 in the last  360 days      Passed - Last BP in normal range    BP Readings from Last 1 Encounters:  11/25/23 116/68         Passed - Valid encounter within last 6 months    Recent Outpatient Visits           4 months ago Cellulitis of left lower extremity   [place] [facility] [place] - [facility] Dept Of [place]. [facility] [place], [place] W, NP   4 months ago Controlled type 2 diabetes mellitus with hyperglycemia, with long-term current use of insulin  [facility])   [place] [facility] - A Dept Of [place]. [facility] Theotis Haze ORN, NP   7 months ago Encounter for [employer] annual wellness exam   [place] [facility] [place] - A Dept Of [place]. [facility] Theotis Haze ORN, NP   8 months ago Primary hypertension   [place] [facility] [place] - A Dept Of [place]. [facility] Theotis Haze ORN, NP   12 months ago Dyshidrotic eczema   [place] [facility] [place] - A Dept Of [place]. [facility] Theotis Haze ORN, [place]

## 2024-04-12 NOTE — Telephone Encounter (Signed)
 Requested medications are due for refill today.  yes  Requested medications are on the active medications list.  yes  Last refill. 08/18/2023 6 month supply  Future visit scheduled.   no  Notes to clinic.  Labs are expired.    Requested Prescriptions  Pending Prescriptions Disp Refills   allopurinol  (ZYLOPRIM ) 100 MG tablet 180 tablet 1    Sig: Take 2 tablets (200 mg total) by mouth daily.     Endocrinology:  Gout Agents - allopurinol  Failed - 04/12/2024  9:56 AM      Failed - Uric Acid in normal range and within 360 days    Uric Acid  Date Value Ref Range Status  07/15/2022 4.5 3.0 - 7.2 mg/dL Final    Comment:               Therapeutic target for gout patients: <6.0         Passed - Cr in normal range and within 360 days    Creatinine, Ser  Date Value Ref Range Status  07/21/2023 1.23 (H) 0.57 - 1.00 mg/dL Final   Creatinine, POC  Date Value Ref Range Status  09/08/2023 76.1 mg/dL Final    Comment:    Abstracted by HIM         Passed - Valid encounter within last 12 months    Recent Outpatient Visits           4 months ago Cellulitis of left lower extremity   [place] [facility] - [facility] Dept Of [place]. [facility] [place], [place] W, NP   4 months ago Controlled type 2 diabetes mellitus with hyperglycemia, with long-term current use of insulin  [facility])   [place] [facility] - A Dept Of [place]. [facility] Theotis Haze ORN, NP   7 months ago Encounter for [employer] annual wellness exam   [place] [facility] [place] - A Dept Of [place]. [facility] Theotis Haze ORN, NP   8 months ago Primary hypertension   [place] [facility] [place] - A Dept Of . [facility] Theotis Haze ORN, NP   12 months ago Dyshidrotic eczema   [place] [facility] [place] - A Dept Of . [facility] [place], Haze ORN, NP              Passed - CBC within normal limits and completed in the last 12  months    WBC  Date Value Ref Range Status  11/25/2023 10.1 3.4 - 10.8 x10E3/uL Final  02/25/2023 10.3 4.0 - 10.5 K/uL Final   RBC  Date Value Ref Range Status  11/25/2023 4.68 3.77 - 5.28 x10E6/uL Final  02/25/2023 3.79 (L) 3.87 - 5.11 MIL/uL Final   Hemoglobin  Date Value Ref Range Status  11/25/2023 12.9 11.1 - 15.9 g/dL Final   Hematocrit  Date Value Ref Range Status  11/25/2023 40.3 34.0 - 46.6 % Final   MCHC  Date Value Ref Range Status  11/25/2023 32.0 31.5 - 35.7 g/dL Final  91/92/7975 69.0 30.0 - 36.0 g/dL Final   [facility]  Date Value Ref Range Status  11/25/2023 27.6 26.6 - 33.0 pg Final  02/25/2023 29.6 26.0 - 34.0 pg Final   MCV  Date Value Ref Range Status  11/25/2023 86 79 - 97 fL Final   No results found for: PLTCOUNTKUC, LABPLAT, POCPLA RDW  Date Value Ref Range Status  11/25/2023 14.4 11.7 - 15.4 %  Final          lovastatin  (MEVACOR ) 40 MG tablet 90 tablet 1    Sig: Take 1 tablet (40 mg total) by mouth at bedtime.     Cardiovascular:  Antilipid - Statins 2 Failed - 04/12/2024  9:56 AM      Failed - Lipid Panel in normal range within the last 12 months    Cholesterol, Total  Date Value Ref Range Status  01/12/2023 193 100 - 199 mg/dL Final   LDL Chol Calc (NIH)  Date Value Ref Range Status  01/12/2023 120 (H) 0 - 99 mg/dL Final   HDL  Date Value Ref Range Status  01/12/2023 45 >39 mg/dL Final   Triglycerides  Date Value Ref Range Status  01/12/2023 158 (H) 0 - 149 mg/dL Final         Passed - Cr in normal range and within 360 days    Creatinine, Ser  Date Value Ref Range Status  07/21/2023 1.23 (H) 0.57 - 1.00 mg/dL Final   Creatinine, POC  Date Value Ref Range Status  09/08/2023 76.1 mg/dL Final    Comment:    Abstracted by HIM         Passed - Patient is not pregnant      Passed - Valid encounter within last 12 months    Recent Outpatient Visits           4 months ago Cellulitis of left lower extremity   [place]  [facility] - A Dept Of [place]. [facility] [place], [place] W, NP   4 months ago Controlled type 2 diabetes mellitus with hyperglycemia, with long-term current use of insulin  [facility])   [place] [facility] - A Dept Of [place]. [facility] Theotis Haze ORN, NP   7 months ago Encounter for [employer] annual wellness exam   [place] [facility] [place] - A Dept Of [place]. [facility] Theotis Haze ORN, NP   8 months ago Primary hypertension   [place] [facility] [place] - A Dept Of . [facility] Theotis Haze ORN, NP   12 months ago Dyshidrotic eczema   [place] [facility] [place] - A Dept Of . [facility] Theotis Haze ORN, NP              Signed Prescriptions Disp Refills   DULoxetine  (CYMBALTA ) 30 MG capsule 180 capsule 0    Sig: Take 1 capsule (30 mg total) by mouth 2 (two) times daily.     Psychiatry: Antidepressants - SNRI - duloxetine  Passed - 04/12/2024  9:56 AM      Passed - Cr in normal range and within 360 days    Creatinine, Ser  Date Value Ref Range Status  07/21/2023 1.23 (H) 0.57 - 1.00 mg/dL Final   Creatinine, POC  Date Value Ref Range Status  09/08/2023 76.1 mg/dL Final    Comment:    Abstracted by HIM         Passed - eGFR is 30 or above and within 360 days    GFR calc Af Amer  Date Value Ref Range Status  09/12/2020 39 (L) >59 mL/min/1.73 Final    Comment:    **In accordance with recommendations from the NKF-ASN Task force,**   [facility] is in the process of updating its eGFR calculation to the   2021 CKD-EPI creatinine equation that estimates kidney function   without a race  variable.    GFR, Estimated  Date Value Ref Range Status  02/25/2023 35 (L) >60 mL/min Final    Comment:    (NOTE) Calculated using the CKD-EPI Creatinine Equation (2021)    GFR  Date Value Ref Range Status  12/10/2021 37.23 (L) >60.00 mL/min Final    Comment:    Calculated using the  CKD-EPI Creatinine Equation (2021)   eGFR  Date Value Ref Range Status  07/21/2023 49 (L) >59 mL/min/1.73 Final   EGFR  Date Value Ref Range Status  09/08/2023 36.0  Final    Comment:    Abstracted by HIM         Passed - Completed PHQ-2 or PHQ-9 in the last 360 days      Passed - Last BP in normal range    BP Readings from Last 1 Encounters:  11/25/23 116/68         Passed - Valid encounter within last 6 months    Recent Outpatient Visits           4 months ago Cellulitis of left lower extremity   [place] [facility] [place] - [facility] Dept Of [place]. [facility] [place], [place] W, NP   4 months ago Controlled type 2 diabetes mellitus with hyperglycemia, with long-term current use of insulin  [facility])   [place] [facility] - A Dept Of [place]. [facility] Theotis Haze ORN, NP   7 months ago Encounter for [employer] annual wellness exam   [place] [facility] [place] - A Dept Of [place]. [facility] Theotis Haze ORN, NP   8 months ago Primary hypertension   [place] [facility] [place] - A Dept Of [place]. [facility] Theotis Haze ORN, NP   12 months ago Dyshidrotic eczema   [place] [facility] [place] - A Dept Of [place]. [facility] Theotis Haze ORN, [place]

## 2024-04-25 ENCOUNTER — Other Ambulatory Visit: Payer: Self-pay | Admitting: Nurse Practitioner

## 2024-04-25 ENCOUNTER — Other Ambulatory Visit: Payer: Self-pay

## 2024-04-25 DIAGNOSIS — E1165 Type 2 diabetes mellitus with hyperglycemia: Secondary | ICD-10-CM

## 2024-04-25 MED ORDER — GLIMEPIRIDE 4 MG PO TABS
4.0000 mg | ORAL_TABLET | Freq: Every day | ORAL | 1 refills | Status: AC
  Filled 2024-04-25: qty 90, 90d supply, fill #0
  Filled 2024-07-05: qty 90, 90d supply, fill #1

## 2024-04-26 ENCOUNTER — Other Ambulatory Visit: Payer: Self-pay

## 2024-04-29 ENCOUNTER — Other Ambulatory Visit: Payer: Self-pay

## 2024-05-02 ENCOUNTER — Other Ambulatory Visit (HOSPITAL_COMMUNITY): Payer: Self-pay

## 2024-05-02 ENCOUNTER — Other Ambulatory Visit: Payer: Self-pay

## 2024-05-10 ENCOUNTER — Other Ambulatory Visit: Payer: Self-pay

## 2024-05-11 ENCOUNTER — Other Ambulatory Visit: Payer: Self-pay

## 2024-05-18 ENCOUNTER — Other Ambulatory Visit: Payer: Self-pay

## 2024-05-25 ENCOUNTER — Other Ambulatory Visit: Payer: Self-pay

## 2024-05-25 ENCOUNTER — Encounter: Payer: Self-pay | Admitting: Nurse Practitioner

## 2024-05-25 ENCOUNTER — Ambulatory Visit: Attending: Nurse Practitioner | Admitting: Nurse Practitioner

## 2024-05-25 VITALS — BP 146/73 | HR 55 | Resp 19 | Ht 61.0 in | Wt 223.6 lb

## 2024-05-25 DIAGNOSIS — E1165 Type 2 diabetes mellitus with hyperglycemia: Secondary | ICD-10-CM | POA: Diagnosis not present

## 2024-05-25 DIAGNOSIS — E1122 Type 2 diabetes mellitus with diabetic chronic kidney disease: Secondary | ICD-10-CM | POA: Diagnosis not present

## 2024-05-25 DIAGNOSIS — M1A09X0 Idiopathic chronic gout, multiple sites, without tophus (tophi): Secondary | ICD-10-CM | POA: Diagnosis not present

## 2024-05-25 DIAGNOSIS — Z794 Long term (current) use of insulin: Secondary | ICD-10-CM | POA: Diagnosis not present

## 2024-05-25 DIAGNOSIS — I10 Essential (primary) hypertension: Secondary | ICD-10-CM

## 2024-05-25 DIAGNOSIS — Z23 Encounter for immunization: Secondary | ICD-10-CM | POA: Diagnosis not present

## 2024-05-25 DIAGNOSIS — N183 Chronic kidney disease, stage 3 unspecified: Secondary | ICD-10-CM

## 2024-05-25 DIAGNOSIS — M62838 Other muscle spasm: Secondary | ICD-10-CM

## 2024-05-25 DIAGNOSIS — G894 Chronic pain syndrome: Secondary | ICD-10-CM | POA: Diagnosis not present

## 2024-05-25 DIAGNOSIS — E785 Hyperlipidemia, unspecified: Secondary | ICD-10-CM | POA: Diagnosis not present

## 2024-05-25 DIAGNOSIS — Z1211 Encounter for screening for malignant neoplasm of colon: Secondary | ICD-10-CM

## 2024-05-25 MED ORDER — ALLOPURINOL 100 MG PO TABS
200.0000 mg | ORAL_TABLET | Freq: Every day | ORAL | 1 refills | Status: AC
  Filled 2024-05-25 – 2024-07-05 (×2): qty 180, 90d supply, fill #0

## 2024-05-25 MED ORDER — ACETAMINOPHEN-CODEINE 300-30 MG PO TABS
1.0000 | ORAL_TABLET | Freq: Three times a day (TID) | ORAL | 0 refills | Status: AC | PRN
  Filled 2024-05-25: qty 60, 10d supply, fill #0

## 2024-05-25 MED ORDER — AMLODIPINE BESYLATE 5 MG PO TABS
5.0000 mg | ORAL_TABLET | Freq: Every day | ORAL | 1 refills | Status: AC
  Filled 2024-05-25 – 2024-08-01 (×2): qty 90, 90d supply, fill #0

## 2024-05-25 MED ORDER — LOVASTATIN 40 MG PO TABS
40.0000 mg | ORAL_TABLET | Freq: Every day | ORAL | 1 refills | Status: AC
  Filled 2024-05-25 – 2024-07-05 (×2): qty 90, 90d supply, fill #0

## 2024-05-25 MED ORDER — DULOXETINE HCL 30 MG PO CPEP
30.0000 mg | ORAL_CAPSULE | Freq: Two times a day (BID) | ORAL | 1 refills | Status: AC
  Filled 2024-05-25 – 2024-07-11 (×2): qty 180, 90d supply, fill #0

## 2024-05-25 MED ORDER — CARVEDILOL 6.25 MG PO TABS
6.2500 mg | ORAL_TABLET | Freq: Two times a day (BID) | ORAL | 1 refills | Status: AC
  Filled 2024-05-25 – 2024-07-04 (×2): qty 180, 90d supply, fill #0

## 2024-05-25 MED ORDER — BACLOFEN 10 MG PO TABS
10.0000 mg | ORAL_TABLET | Freq: Three times a day (TID) | ORAL | 0 refills | Status: AC
  Filled 2024-05-25: qty 60, 20d supply, fill #0

## 2024-05-25 MED ORDER — ONETOUCH DELICA PLUS LANCET33G MISC
1.0000 | Freq: Every day | 1 refills | Status: AC
  Filled 2024-05-25: qty 100, 100d supply, fill #0
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
## 2024-05-27 LAB — CBC WITH DIFFERENTIAL/PLATELET
Basophils Absolute: 0.1 x10E3/uL (ref 0.0–0.2)
Basos: 1 %
EOS (ABSOLUTE): 0.3 x10E3/uL (ref 0.0–0.4)
Eos: 3 %
Hematocrit: 45 % (ref 34.0–46.6)
Hemoglobin: 14.5 g/dL (ref 11.1–15.9)
Immature Grans (Abs): 0 x10E3/uL (ref 0.0–0.1)
Immature Granulocytes: 0 %
Lymphocytes Absolute: 1.9 x10E3/uL (ref 0.7–3.1)
Lymphs: 21 %
MCH: 28.6 pg (ref 26.6–33.0)
MCHC: 32.2 g/dL (ref 31.5–35.7)
MCV: 89 fL (ref 79–97)
Monocytes Absolute: 0.7 x10E3/uL (ref 0.1–0.9)
Monocytes: 7 %
Neutrophils Absolute: 6.3 x10E3/uL (ref 1.4–7.0)
Neutrophils: 68 %
Platelets: 275 x10E3/uL (ref 150–450)
RBC: 5.07 x10E6/uL (ref 3.77–5.28)
RDW: 14.5 % (ref 11.7–15.4)
WBC: 9.2 x10E3/uL (ref 3.4–10.8)

## 2024-05-27 LAB — CMP14+EGFR
ALT: 16 IU/L (ref 0–32)
AST: 14 IU/L (ref 0–40)
Albumin: 4 g/dL (ref 3.9–4.9)
Alkaline Phosphatase: 76 IU/L (ref 49–135)
BUN/Creatinine Ratio: 23 (ref 12–28)
BUN: 43 mg/dL — ABNORMAL HIGH (ref 8–27)
Bilirubin Total: 0.3 mg/dL (ref 0.0–1.2)
CO2: 22 mmol/L (ref 20–29)
Calcium: 9.9 mg/dL (ref 8.7–10.3)
Chloride: 106 mmol/L (ref 96–106)
Creatinine, Ser: 1.89 mg/dL — ABNORMAL HIGH (ref 0.57–1.00)
Globulin, Total: 2.5 g/dL (ref 1.5–4.5)
Glucose: 85 mg/dL (ref 70–99)
Potassium: 4.6 mmol/L (ref 3.5–5.2)
Sodium: 144 mmol/L (ref 134–144)
Total Protein: 6.5 g/dL (ref 6.0–8.5)
eGFR: 29 mL/min/1.73 — ABNORMAL LOW (ref >59)

## 2024-05-27 LAB — MICROALBUMIN / CREATININE URINE RATIO
Creatinine, Urine: 93 mg/dL
Microalb/Creat Ratio: 1563 mg/g{creat} — AB (ref 0–29)
Microalbumin, Urine: 1453.3 ug/mL

## 2024-05-27 LAB — HEMOGLOBIN A1C
Est. average glucose Bld gHb Est-mCnc: 157 mg/dL
Hgb A1c MFr Bld: 7.1 % — ABNORMAL HIGH (ref 4.8–5.6)

## 2024-06-01 ENCOUNTER — Other Ambulatory Visit: Payer: Self-pay

## 2024-06-02 ENCOUNTER — Other Ambulatory Visit: Payer: Self-pay

## 2024-06-02 NOTE — Progress Notes (Signed)
 Kristy Flores                                          MRN: [id number]   06/02/2024   The [facility] Quality Team Specialist reviewed this patient medical record for the purposes of chart review for care gap closure. The following were reviewed: abstraction for care gap closure-glycemic status assessment.    [facility] Quality Team

## 2024-06-02 NOTE — Progress Notes (Signed)
 Kristy Flores                                          MRN: [id number]   06/02/2024   The [facility] Quality Team Specialist reviewed this patient medical record for the purposes of chart review for care gap closure. The following were reviewed: abstraction for care gap closure-kidney health evaluation for diabetes:eGFR  and uACR.    [facility] Quality Team

## 2024-06-03 NOTE — Progress Notes (Signed)
 Kristy Flores                                          MRN: [id number]   06/03/2024   The [facility] Quality Team Specialist reviewed this patient medical record for the purposes of chart review for care gap closure. The following were reviewed: chart review for care gap closure-controlling blood pressure.    [facility] Quality Team

## 2024-06-09 DIAGNOSIS — N1832 Chronic kidney disease, stage 3b: Secondary | ICD-10-CM | POA: Diagnosis not present

## 2024-06-13 ENCOUNTER — Ambulatory Visit: Admitting: Nurse Practitioner

## 2024-06-13 ENCOUNTER — Ambulatory Visit

## 2024-06-15 ENCOUNTER — Other Ambulatory Visit: Payer: Self-pay

## 2024-06-15 DIAGNOSIS — E875 Hyperkalemia: Secondary | ICD-10-CM | POA: Diagnosis not present

## 2024-06-15 DIAGNOSIS — E1122 Type 2 diabetes mellitus with diabetic chronic kidney disease: Secondary | ICD-10-CM | POA: Diagnosis not present

## 2024-06-15 DIAGNOSIS — E872 Acidosis, unspecified: Secondary | ICD-10-CM | POA: Diagnosis not present

## 2024-06-15 DIAGNOSIS — N184 Chronic kidney disease, stage 4 (severe): Secondary | ICD-10-CM | POA: Diagnosis not present

## 2024-06-15 DIAGNOSIS — N2581 Secondary hyperparathyroidism of renal origin: Secondary | ICD-10-CM | POA: Diagnosis not present

## 2024-06-15 DIAGNOSIS — R809 Proteinuria, unspecified: Secondary | ICD-10-CM | POA: Diagnosis not present

## 2024-06-15 DIAGNOSIS — I129 Hypertensive chronic kidney disease with stage 1 through stage 4 chronic kidney disease, or unspecified chronic kidney disease: Secondary | ICD-10-CM | POA: Diagnosis not present

## 2024-06-15 MED ORDER — LOSARTAN POTASSIUM 50 MG PO TABS
50.0000 mg | ORAL_TABLET | Freq: Every day | ORAL | 3 refills | Status: AC
  Filled 2024-06-15: qty 90, 90d supply, fill #0

## 2024-06-19 ENCOUNTER — Ambulatory Visit: Payer: Self-pay | Admitting: Nurse Practitioner

## 2024-06-30 NOTE — Progress Notes (Signed)
 Bergen Perdue Botello                                          MRN: [id number]   06/30/2024   The [facility] Quality Team Specialist reviewed this patient medical record for the purposes of chart review for care gap closure. The following were reviewed: chart review for care gap closure-controlling blood pressure.    [facility] Quality Team

## 2024-07-01 ENCOUNTER — Other Ambulatory Visit: Payer: Self-pay

## 2024-07-04 ENCOUNTER — Other Ambulatory Visit: Payer: Self-pay

## 2024-07-05 ENCOUNTER — Other Ambulatory Visit: Payer: Self-pay

## 2024-07-06 ENCOUNTER — Other Ambulatory Visit: Payer: Self-pay

## 2024-07-11 ENCOUNTER — Other Ambulatory Visit: Payer: Self-pay

## 2024-07-12 NOTE — Progress Notes (Signed)
 Kristy Flores                                          MRN: [id number]   07/12/2024   The [facility] Quality Team Specialist reviewed this patient medical record for the purposes of chart review for care gap closure. The following were reviewed: chart review for care gap closure-controlling blood pressure.    [facility] Quality Team

## 2024-08-01 ENCOUNTER — Other Ambulatory Visit: Payer: Self-pay

## 2024-08-22 ENCOUNTER — Other Ambulatory Visit: Payer: Self-pay

## 2024-09-23 ENCOUNTER — Ambulatory Visit: Admitting: Nurse Practitioner
